# Patient Record
Sex: Female | Born: 1975 | Race: Black or African American | Hispanic: No | Marital: Married | Smoking: Never smoker
Health system: Southern US, Community
[De-identification: ages and names within clinical notes are randomized; demographics above are authoritative.]

## PROBLEM LIST (undated history)

## (undated) DIAGNOSIS — I10 Essential (primary) hypertension: Secondary | ICD-10-CM

## (undated) DIAGNOSIS — T7840XA Allergy, unspecified, initial encounter: Secondary | ICD-10-CM

## (undated) DIAGNOSIS — D509 Iron deficiency anemia, unspecified: Secondary | ICD-10-CM

## (undated) DIAGNOSIS — E7401 von Gierke disease: Secondary | ICD-10-CM

## (undated) DIAGNOSIS — D75A Glucose-6-phosphate dehydrogenase (G6PD) deficiency without anemia: Secondary | ICD-10-CM

## (undated) HISTORY — DX: Glucose-6-phosphate dehydrogenase (G6PD) deficiency without anemia: D75.A

## (undated) HISTORY — DX: Allergy, unspecified, initial encounter: T78.40XA

## (undated) HISTORY — DX: Von Gierke disease: E74.01

## (undated) HISTORY — DX: Essential (primary) hypertension: I10

## (undated) HISTORY — DX: Iron deficiency anemia, unspecified: D50.9

---

## 1998-08-18 ENCOUNTER — Emergency Department (HOSPITAL_COMMUNITY): Admission: EM | Admit: 1998-08-18 | Discharge: 1998-08-18 | Payer: Self-pay | Admitting: Emergency Medicine

## 1998-10-21 ENCOUNTER — Other Ambulatory Visit: Admission: RE | Admit: 1998-10-21 | Discharge: 1998-10-21 | Payer: Self-pay | Admitting: Obstetrics and Gynecology

## 1999-10-28 ENCOUNTER — Other Ambulatory Visit: Admission: RE | Admit: 1999-10-28 | Discharge: 1999-10-28 | Payer: Self-pay | Admitting: *Deleted

## 2000-05-15 ENCOUNTER — Inpatient Hospital Stay (HOSPITAL_COMMUNITY): Admission: AD | Admit: 2000-05-15 | Discharge: 2000-05-15 | Payer: Self-pay | Admitting: Obstetrics and Gynecology

## 2000-05-16 ENCOUNTER — Ambulatory Visit (HOSPITAL_COMMUNITY): Admission: RE | Admit: 2000-05-16 | Discharge: 2000-05-16 | Payer: Self-pay | Admitting: Obstetrics and Gynecology

## 2000-06-24 ENCOUNTER — Encounter: Payer: Self-pay | Admitting: Obstetrics and Gynecology

## 2000-06-24 ENCOUNTER — Ambulatory Visit (HOSPITAL_COMMUNITY): Admission: RE | Admit: 2000-06-24 | Discharge: 2000-06-24 | Payer: Self-pay | Admitting: Obstetrics and Gynecology

## 2000-10-04 ENCOUNTER — Other Ambulatory Visit: Admission: RE | Admit: 2000-10-04 | Discharge: 2000-10-04 | Payer: Self-pay | Admitting: Obstetrics and Gynecology

## 2003-02-06 ENCOUNTER — Other Ambulatory Visit: Admission: RE | Admit: 2003-02-06 | Discharge: 2003-02-06 | Payer: Self-pay | Admitting: Obstetrics & Gynecology

## 2003-12-30 ENCOUNTER — Emergency Department (HOSPITAL_COMMUNITY): Admission: EM | Admit: 2003-12-30 | Discharge: 2003-12-30 | Payer: Self-pay | Admitting: *Deleted

## 2004-02-17 ENCOUNTER — Other Ambulatory Visit: Admission: RE | Admit: 2004-02-17 | Discharge: 2004-02-17 | Payer: Self-pay | Admitting: Obstetrics & Gynecology

## 2005-07-05 ENCOUNTER — Other Ambulatory Visit: Admission: RE | Admit: 2005-07-05 | Discharge: 2005-07-05 | Payer: Self-pay | Admitting: Family Medicine

## 2006-04-05 ENCOUNTER — Ambulatory Visit (HOSPITAL_COMMUNITY): Admission: RE | Admit: 2006-04-05 | Discharge: 2006-04-05 | Payer: Self-pay | Admitting: Obstetrics and Gynecology

## 2006-06-16 ENCOUNTER — Encounter (HOSPITAL_COMMUNITY): Admission: AD | Admit: 2006-06-16 | Discharge: 2006-06-17 | Payer: Self-pay | Admitting: *Deleted

## 2006-06-21 ENCOUNTER — Inpatient Hospital Stay (HOSPITAL_COMMUNITY): Admission: AD | Admit: 2006-06-21 | Discharge: 2006-08-19 | Payer: Self-pay | Admitting: Obstetrics and Gynecology

## 2006-06-22 ENCOUNTER — Ambulatory Visit: Payer: Self-pay | Admitting: Pediatrics

## 2006-08-26 ENCOUNTER — Inpatient Hospital Stay (HOSPITAL_COMMUNITY): Admission: AD | Admit: 2006-08-26 | Discharge: 2006-08-28 | Payer: Self-pay | Admitting: Obstetrics and Gynecology

## 2006-08-26 ENCOUNTER — Encounter (INDEPENDENT_AMBULATORY_CARE_PROVIDER_SITE_OTHER): Payer: Self-pay | Admitting: Specialist

## 2006-08-30 ENCOUNTER — Encounter: Admission: RE | Admit: 2006-08-30 | Discharge: 2006-09-28 | Payer: Self-pay | Admitting: Obstetrics and Gynecology

## 2006-09-29 ENCOUNTER — Encounter: Admission: RE | Admit: 2006-09-29 | Discharge: 2006-10-29 | Payer: Self-pay | Admitting: Obstetrics and Gynecology

## 2008-03-05 ENCOUNTER — Emergency Department: Payer: Self-pay | Admitting: Emergency Medicine

## 2010-08-19 LAB — CONVERTED CEMR LAB: Pap Smear: NORMAL

## 2010-08-24 ENCOUNTER — Ambulatory Visit: Payer: Self-pay | Admitting: Family Medicine

## 2010-08-24 DIAGNOSIS — D6489 Other specified anemias: Secondary | ICD-10-CM | POA: Insufficient documentation

## 2010-08-24 DIAGNOSIS — J309 Allergic rhinitis, unspecified: Secondary | ICD-10-CM | POA: Insufficient documentation

## 2010-08-24 DIAGNOSIS — D509 Iron deficiency anemia, unspecified: Secondary | ICD-10-CM | POA: Insufficient documentation

## 2010-08-24 DIAGNOSIS — I10 Essential (primary) hypertension: Secondary | ICD-10-CM | POA: Insufficient documentation

## 2010-08-24 LAB — CONVERTED CEMR LAB
CO2: 28 meq/L (ref 19–32)
Chloride: 106 meq/L (ref 96–112)
Eosinophils Relative: 2 % (ref 0.0–5.0)
Glucose, Bld: 102 mg/dL — ABNORMAL HIGH (ref 70–99)
HCT: 28.7 % — ABNORMAL LOW (ref 36.0–46.0)
Lymphs Abs: 2.5 10*3/uL (ref 0.7–4.0)
Monocytes Relative: 7.7 % (ref 3.0–12.0)
Neutrophils Relative %: 47.3 % (ref 43.0–77.0)
Platelets: 256 10*3/uL (ref 150.0–400.0)
Potassium: 3.9 meq/L (ref 3.5–5.1)
Sodium: 141 meq/L (ref 135–145)
WBC: 5.9 10*3/uL (ref 4.5–10.5)

## 2010-09-25 ENCOUNTER — Ambulatory Visit: Payer: Self-pay | Admitting: Family Medicine

## 2010-11-19 NOTE — Assessment & Plan Note (Signed)
Summary: new patient to establish/alc   Vital Signs:  Patient profile:   35 year old female Height:      64 inches Weight:      153 pounds BMI:     26.36 Temp:     97.9 degrees F oral Pulse rate:   72 / minute Pulse rhythm:   regular BP sitting:   140 / 90  (right arm) Cuff size:   regular  Vitals Entered By: Linde Gillis CMA Duncan Dull) (August 24, 2010 11:00 AM) CC: new patient, establish care, Abdominal Pain   History of Present Illness: 35 yo here to establish care.  HTN- at a health fair at work a few weeks ago, BP was 150s/90s.  Never been elevated in past.  Continues to remain 140s/80s-150s/90s at work.  No HA, blurred vision, CP or LE edema. Dad has HTN.  Does eat some salty and processed foods.  Gained a few pounds in the last year.   Does take Claritin and Sudafed several times per week.  Allergic rhinitis- see above, takes Sudafed and Claritin several times per week.  Multiple outdoor triggers.  Fall seems to be just as bad a spring.  No cough or wheezing.  Never been on an inhaled steroid.  G6PD deficiency- diagnosed at birth.  Son, Doristine Locks, has it as well.  h/o iron deficiency anemia- last checked several years ago, Hbg was 9!  No DOE, dizziness or fatigue.  Does have very heavy periods.  Not on OCPs, she and her husband are trying to get pregnant.   Preventive Screening-Counseling & Management  Alcohol-Tobacco     Smoking Status: never      Drug Use:  no.    Current Medications (verified): 1)  Amlodipine Besylate 5 Mg Tabs (Amlodipine Besylate) .Marland Kitchen.. 1 Tab By Mouth Daily. 2)  Fluticasone Propionate 50 Mcg/act  Susp (Fluticasone Propionate) .... 2 Sprays Each Nostril Once Daily  Allergies (verified): 1)  ! Aspirin 2)  ! 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  2 childen- see me, Nia and Griffen. 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  2 childen- see me, Nia and Griffen. Married Drug use-no Alcohol use-no Never Smoked Drug Use:  no Smoking Status:  never  Review of Systems      See HPI General:  Denies malaise. Eyes:  Denies blurring. ENT:  Denies difficulty swallowing. CV:  Denies chest pain or discomfort. Resp:  Denies shortness of breath. GI:  Denies abdominal pain. GU:  Denies urinary frequency and urinary hesitancy. MS:  Denies joint pain, joint redness, and joint swelling. Derm:  Denies rash. Neuro:  Denies headaches. Psych:  Denies anxiety and depression. Endo:  Denies cold intolerance and heat intolerance. Heme:  Denies abnormal bruising, bleeding, and enlarge lymph nodes.  Physical Exam  General:  Well-developed,well-nourished,in no acute distress; alert,appropriate and cooperative throughout examination Head:  normocephalic, atraumatic, no abnormalities observed, and no abnormalities palpated.   Eyes:  vision grossly intact, pupils equal, and pupils round.   Ears:  R ear normal and L ear normal.   Nose:  boggy turbinates. sinuses NTTP. Mouth:  Oral mucosa and oropharynx without lesions or exudates.  Teeth in good  repair. Lungs:  Normal respiratory effort, chest expands symmetrically. Lungs are clear to auscultation, no crackles or wheezes. Heart:  Normal rate and regular rhythm. S1 and S2 normal without gallop, murmur, click, rub or other extra sounds. Abdomen:  Bowel sounds positive,abdomen soft and non-tender without masses, organomegaly or hernias noted. Msk:  No deformity or scoliosis noted of thoracic or lumbar spine.   Extremities:  no edema Neurologic:  alert & oriented X3 and cranial nerves II-XII intact.   Skin:  Intact without suspicious  lesions or rashes Psych:  Cognition and judgment appear intact. Alert and cooperative with normal attention span and concentration. No apparent delusions, illusions, hallucinations   Impression & Recommendations:  Problem # 1:  HYPERTENSION (ICD-401.9) Assessment New Discussed different treatment options with Yaakov Guthrie. Difficult because she has G6PD, should not take Sulfa and therefore I would like to avoid HCTZ. Cannot start ACEI because she is trying to get pregnant. Decided on Amlodipine, discussed that it is still a pregnancy class C. Will check BMET today. Follow up in one month. She will cut back on salt in diet and increase physical activity. Her updated medication list for this problem includes:    Amlodipine Besylate 5 Mg Tabs (Amlodipine besylate) .Marland Kitchen... 1 tab by mouth daily.  Orders: Venipuncture (16109)  Problem # 2:  ALLERGIC RHINITIS (ICD-477.9) Assessment: Deteriorated Start Flonase, continue Claritin.  Avoid Sudafed due to #1. Her updated medication list for this problem includes:    Fluticasone Propionate 50 Mcg/act Susp (Fluticasone propionate) .Marland Kitchen... 2 sprays each nostril once daily  Problem # 3:  ANEMIA-IRON DEFICIENCY (ICD-280.9) Recheck CBC today.  Complete Medication List: 1)  Amlodipine Besylate 5 Mg Tabs (Amlodipine besylate) .Marland Kitchen.. 1 tab by mouth daily. 2)  Fluticasone Propionate 50 Mcg/act Susp (Fluticasone propionate) .... 2 sprays each nostril once daily  Other Orders: TLB-CBC Platelet - w/Differential (85025-CBCD) TLB-BMP (Basic Metabolic Panel-BMET) (80048-METABOL)  Patient Instructions: 1)  Great to see you again. 2)  Please come see me in one month. Prescriptions: FLUTICASONE PROPIONATE 50 MCG/ACT  SUSP (FLUTICASONE PROPIONATE) 2 sprays each nostril once daily  #1 vial x 3   Entered and Authorized by:   Ruthe Mannan MD   Signed by:   Ruthe Mannan MD on 08/24/2010   Method used:   Print then Give to Patient   RxID:   [id number] AMLODIPINE  BESYLATE 5 MG TABS (AMLODIPINE BESYLATE) 1 tab by mouth daily.  #30 x 0   Entered and Authorized by:   Ruthe Mannan MD   Signed by:   Ruthe Mannan MD on 08/24/2010   Method used:   Print then Give to Patient   RxID:   [id number]    Orders Added: 1)  Venipuncture [29528] 2)  TLB-CBC Platelet - w/Differential [85025-CBCD] 3)  TLB-BMP (Basic Metabolic Panel-BMET) [80048-METABOL] 4)  New Patient Level III [41324]    Prior Medications (reviewed today): None Current Allergies (reviewed today): ! ASPIRIN ! SULFA  HDL Result Date:  08/10/2010 HDL Result:  80 LDL Result Date:  08/10/2010 LDL Result:  106 PAP Result Date:  08/19/2010 PAP Result:  normal- historical

## 2010-11-19 NOTE — Assessment & Plan Note (Signed)
Summary: 1 month follow up/rbh   Vital Signs:  Patient profile:   35 year old female Height:      64 inches Weight:      153.50 pounds BMI:     26.44 Temp:     98.6 degrees F oral Pulse rate:   76 / minute Pulse rhythm:   regular BP sitting:   130 / 90  (right arm) Cuff size:   regular  Vitals Entered By: Linde Gillis CMA Duncan Dull) (September 25, 2010 12:08 PM) CC: one month follow up   History of Present Illness: 35 yo here for one month follow up HTN.  Last month, started amlodipine 5 mg daily. 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 Diastolic mildly elevated today but she was rushing to get her an having an argument with her husband. Has not noticed any adverse affects from the amlodipine. No CP, blurred vision, HA or LE edema.  h/o iron deficiency anemia- Hbg last month 9.6.  Taking ferrous sulfate 325 mg daily.   No DOE, dizziness or fatigue.  Does have very heavy periods.  Not on OCPs, she and her husband are trying to get pregnant.  No constipation from the iron.  Current Medications (verified): 1)  Amlodipine Besylate 5 Mg Tabs (Amlodipine Besylate) .Marland Kitchen.. 1 Tab By Mouth Daily. 2)  Fluticasone Propionate 50 Mcg/act  Susp (Fluticasone Propionate) .... 2 Sprays Each Nostril Once Daily 3)  Protonix 40 Mg  Tbec (Pantoprazole Sodium) .... Once Daily 4)  Ferrous Sulfate 325 (65 Fe) Mg Tabs (Ferrous Sulfate) .Marland Kitchen.. 1 Tab By Mouth Two Times A Day.  Allergies: 1)  ! Aspirin 2)  ! 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 Married Drug use-no Alcohol use-no Never Smoked  Risk Factors: Smoking Status: never (08/24/2010)  Review of Systems      See HPI General:   Denies malaise. Eyes:  Denies blurring. ENT:  Denies difficulty swallowing. CV:  Denies chest pain or discomfort. Resp:  Denies shortness of breath. Neuro:  Denies visual disturbances and weakness.  Physical Exam  General:  Well-developed,well-nourished,in no acute distress; alert,appropriate and cooperative throughout examination Lungs:  Normal respiratory effort, chest expands symmetrically. Lungs are clear to auscultation, no crackles or wheezes. Heart:  Normal rate and regular rhythm. S1 and S2 normal without gallop, murmur, click, rub or other extra sounds. Extremities:  no edema Neurologic:  alert & oriented X3 and cranial nerves II-XII intact.   Psych:  Cognition and judgment appear intact. Alert and cooperative with normal attention span and concentration. No apparent delusions, illusions, hallucinations   Impression & Recommendations:  Problem # 1:  HYPERTENSION (ICD-401.9) Assessment Improved Continue current dose of Amlodipine. Her updated medication list for this problem includes:    Amlodipine Besylate 5 Mg Tabs (Amlodipine besylate) .Marland Kitchen... 1 tab by mouth daily.  Problem # 2:  ANEMIA-IRON DEFICIENCY (ICD-280.9) Assessment: Unchanged Advised to increase dose of ferrous sulfate to 325 mg two times a day.  Recheck in 3-6 months. Her updated medication list for this problem includes:    Ferrous Sulfate 325 (65 Fe) Mg Tabs (Ferrous sulfate) .Marland Kitchen... 1 tab by mouth two times a day.  Complete Medication List: 1)  Amlodipine Besylate 5 Mg  Tabs (Amlodipine besylate) .Marland Kitchen.. 1 tab by mouth daily. 2)  Fluticasone Propionate 50 Mcg/act Susp (Fluticasone propionate) .... 2 sprays each nostril once daily 3)  Protonix 40 Mg Tbec (Pantoprazole sodium) .... Once daily 4)  Ferrous Sulfate 325 (65 Fe) Mg Tabs (Ferrous sulfate) .Marland Kitchen.. 1 tab by mouth two times a day. Prescriptions: PROTONIX 40 MG  TBEC (PANTOPRAZOLE SODIUM) once daily  #30 x 3   Entered and Authorized by:   Ruthe Mannan MD    Signed by:   Ruthe Mannan MD on 09/25/2010   Method used:   Print then Give to Patient   RxID:   [id number] AMLODIPINE BESYLATE 5 MG TABS (AMLODIPINE BESYLATE) 1 tab by mouth daily.  #90 x 3   Entered and Authorized by:   Ruthe Mannan MD   Signed by:   Ruthe Mannan MD on 09/25/2010   Method used:   Print then Give to Patient   RxID:   [id number] PROTONIX 40 MG  TBEC (PANTOPRAZOLE SODIUM) once daily  #90 x 3   Entered and Authorized by:   Ruthe Mannan MD   Signed by:   Ruthe Mannan MD on 09/25/2010   Method used:   Print then Give to Patient   RxID:   [id number]    Orders Added: 1)  Est. Patient Level III [01027]    Current Allergies (reviewed today): ! ASPIRIN ! SULFA

## 2011-01-07 ENCOUNTER — Other Ambulatory Visit: Payer: Self-pay | Admitting: Physician Assistant

## 2011-01-18 ENCOUNTER — Inpatient Hospital Stay (HOSPITAL_COMMUNITY): Payer: PRIVATE HEALTH INSURANCE

## 2011-01-18 ENCOUNTER — Inpatient Hospital Stay (HOSPITAL_COMMUNITY)
Admission: AD | Admit: 2011-01-18 | Discharge: 2011-01-18 | Disposition: A | Discharge status: Home / Self Care | Payer: PRIVATE HEALTH INSURANCE | Source: Ambulatory Visit | Attending: Obstetrics and Gynecology | Admitting: Obstetrics and Gynecology

## 2011-01-18 ENCOUNTER — Other Ambulatory Visit: Payer: Self-pay | Admitting: Obstetrics and Gynecology

## 2011-01-18 DIAGNOSIS — O039 Complete or unspecified spontaneous abortion without complication: Secondary | ICD-10-CM | POA: Insufficient documentation

## 2011-01-18 LAB — CBC
HCT: 26.3 % — ABNORMAL LOW (ref 36.0–46.0)
Hemoglobin: 8.4 g/dL — ABNORMAL LOW (ref 12.0–15.0)
MCH: 29.4 pg (ref 26.0–34.0)
MCHC: 31.9 g/dL (ref 30.0–36.0)
MCV: 92 fL (ref 78.0–100.0)

## 2011-02-02 ENCOUNTER — Encounter: Payer: Self-pay | Admitting: Family Medicine

## 2011-02-04 ENCOUNTER — Ambulatory Visit (INDEPENDENT_AMBULATORY_CARE_PROVIDER_SITE_OTHER): Payer: PRIVATE HEALTH INSURANCE | Admitting: Family Medicine

## 2011-02-04 ENCOUNTER — Encounter: Payer: Self-pay | Admitting: Family Medicine

## 2011-02-04 ENCOUNTER — Encounter: Payer: Self-pay | Admitting: *Deleted

## 2011-02-04 DIAGNOSIS — Z8759 Personal history of other complications of pregnancy, childbirth and the puerperium: Secondary | ICD-10-CM

## 2011-02-04 DIAGNOSIS — D509 Iron deficiency anemia, unspecified: Secondary | ICD-10-CM

## 2011-02-04 DIAGNOSIS — I10 Essential (primary) hypertension: Secondary | ICD-10-CM

## 2011-02-04 DIAGNOSIS — Z8742 Personal history of other diseases of the female genital tract: Secondary | ICD-10-CM

## 2011-02-04 LAB — CBC WITH DIFFERENTIAL/PLATELET
Basophils Absolute: 0 10*3/uL (ref 0.0–0.1)
HCT: 29.4 % — ABNORMAL LOW (ref 36.0–46.0)
Lymphs Abs: 2.3 10*3/uL (ref 0.7–4.0)
MCV: 98.5 fl (ref 78.0–100.0)
Monocytes Absolute: 0.4 10*3/uL (ref 0.1–1.0)
Neutro Abs: 2.9 10*3/uL (ref 1.4–7.7)
Platelets: 340 10*3/uL (ref 150.0–400.0)
RDW: 15.6 % — ABNORMAL HIGH (ref 11.5–14.6)

## 2011-02-04 LAB — IBC PANEL: Transferrin: 251.4 mg/dL (ref 212.0–360.0)

## 2011-02-04 NOTE — Assessment & Plan Note (Signed)
With worsening of her chronic iron deficiency anemia, Recheck CBC, TIBC, retic count today. Continue iron supplementation.

## 2011-02-04 NOTE — Progress Notes (Signed)
Addended byMills Koller on: 02/04/2011 12:53 PM   Modules accepted: Orders

## 2011-02-04 NOTE — Assessment & Plan Note (Signed)
Improved. D/c amlodipine as she and her husband are going to try for another pregnancy. She will call me with BP readings. If it increases again, we will start betablocker. The patient indicates understanding of these issues and agrees with the plan.

## 2011-02-04 NOTE — Progress Notes (Signed)
35 yo here female here to discuss anemia.  Had a miscarriage at 8 weeks of pregnancy on 01/18/2011. Per pt, saw Dr. Billy Coast (OBGYN), required D and C. Hgb at that time was 7.1, increased to 7.8 a week later.  This is her first miscarriage.  Pt is still very fatigued.  Stopped her amlodipine as well.  No CP or SOB.  Feels she is dealing well emotionally, denies any symptoms of depression.  The PMH, PSH, Social History, Family History, Medications, and allergies have been reviewed in [facility], and have been updated if relevant.  ROS: General: Denies fever, chills, sweats. No significant weight loss. Cardiovascular: Denies chest pains, palpitations, dyspnea on exertion,  Respiratory: Denies cough, dyspnea at rest,wheeezing Neuro: Denies  paresthesias, frequent falls, frequent headaches Psych: Denies depression, anxiety Endocrine: Denies cold intolerance, heat intolerance, polydipsia Heme: Denies enlarged lymph nodes  Physical exam: BP 136/90  Pulse 80  Temp(Src) 98.5 F (36.9 C) (Oral)  Ht 5\' 4"  (1.626 m)  Wt 154 lb 6.4 oz (70.035 kg)  BMI 26.50 kg/m2 Gen:  Alert, NAD CVS:  RRR Resp:  CTA bilaterally. Psych: good eye contact, no signs of depression or anxiety.

## 2011-02-05 LAB — RETICULOCYTES: Retic Ct Pct: 5.3 % — ABNORMAL HIGH (ref 0.4–3.1)

## 2011-02-05 NOTE — H&P (Signed)
Jodi Ross, Jodi Ross               ACCOUNT NO.:  [id number]  MEDICAL RECORD NO.:  [id number]           PATIENT TYPE:  O  LOCATION:  [facility]                         [facility]  PHYSICIAN:  Lenoard Aden, M.D.DATE OF BIRTH:  Feb 09, 1976  DATE OF ADMISSION:  01/18/2011 DATE OF DISCHARGE:                             HISTORY & PHYSICAL   CHIEF COMPLAINT:  Bleeding.  She is a 35 year old African American female G4, P2-0-1-2 at 8 plus weeks' gestation with a documented viability by ultrasound performed on January 14, 2011, which revealed a viable 8-week intrauterine pregnancy. She started with bleeding that became heavy this afternoon, presents now for evaluation.  She denies fevers, chills, nausea, vomiting.  She denies dizziness.  She has a history of chronic anemia.  She has allergies to aspirin and sulfa drugs.  She is a nonsmoker, nondrinker. She denies domestic or physical violence.  She has a family history of diabetes, anemia, and chronic hypertension.  She has a personal history also remarkable for history of Trichomonas, history of G6PD deficiency. Obstetric history remarkable for one trimester ATAB and two vaginal deliveries.  She had a history of a LEEP for dysplasia in 2010.  Medications include iron, Claritin as needed, prenatal vitamins, Norvasc, Zofran, and Protonix.  PHYSICAL EXAM:  GENERAL:  She is a well-developed, well-nourished [place] American female, in no acute distress. VITAL SIGNS:  Blood pressure 92/50, pulse 83, respirations 18. HEENT:  Normal. NECK:  Supple.  Full range of motion. LUNGS:  Clear. ABDOMEN:  Soft, nontender.  No rebound, guarding, no CVA tenderness. EXTREMITIES:  There are no cords. NEUROLOGIC:  Nonfocal. SKIN:  Intact. PELVIC:  Upon placement of bivalve speculum reveals a large amount of clot in the vagina and a large bluish discoloration likely gestational sac, which was extracted using ring forceps.  Further probing of  the endocervical canal and uterus shows little to no more tissue after extraction of further blood clots.  4x4s are used in conjunction with ring forceps to swab further bleeding from the vagina.  Upon reevaluation of her cervix, the cervix was opened, but minimal active bleeding is noted.  Pelvic exam reveals a well-contracted uterus and no adnexal masses.  No pelvic tenderness noted.  CBC reveals a white blood cell count of 9, hemoglobin 8.4, hematocrit 26.3, and platelet count of 203.  Ultrasound is pending.  IMPRESSION:  Probable complete abortion.  PLAN:  Check ultrasound.  We will treat empirically with doxycycline, give ibuprofen for cramping.  Follow up in the office pending her bleeding.  She will be discharged home pending her ultrasound results. Her blood type is A positive.     Lenoard Aden, M.D.     RJT/MEDQ  D:  01/18/2011  T:  01/19/2011  Job:  [id number]  Electronically Signed by Olivia Mackie M.D. on 02/05/2011 03:18:01 PM

## 2011-03-05 NOTE — H&P (Signed)
NAMESHIRLIE, Jodi Ross               ACCOUNT NO.:  [id number]   MEDICAL RECORD NO.:  [id number]          PATIENT TYPE:  MAT   LOCATION:  [facility]                          [facility]  [facility]   PHYSICIAN:  Lenoard Aden, M.D.DATE OF BIRTH:  [birth year]   DATE OF ADMISSION:  08/26/2006  DATE OF DISCHARGE:                                HISTORY & PHYSICAL   CHIEF COMPLAINT:  Contractions.   She is a 35 year old African-American female, G3, P0-1-1-1, who presents at  34-1/7 weeks' gestation with increased frequency of contractions.  She has a  history of preterm birth, history of cerclage.   MEDICATIONS:  Prenatal vitamins and 17-hydroxyprogesterone.   She has a family history of diabetes, hypertension and anemia.   Pregnancy course complicated by preterm cervical change, for which she was  in the hospital for approximately 8 weeks from 24 weeks and discharged with  stable cervical change.   PHYSICAL EXAMINATION:  GENERAL:  She is a well-developed, well-nourished  African-American female in no acute distress.  HEENT:  Normal.  LUNGS:  Clear.  HEART:  Regular rate and rhythm.  ABDOMEN:  Soft, gravid, nontender to palpation.  PELVIC:  Cervix is 2-3 cm and about 80-90% effaced.  The cerclage is intact  for 360 degrees.  No bleeding noted.  Membranes palpable.  Vertex position,  -1 to 0 station.  Contractions every 3 minutes noted.   The patient was brought to Maternity Admissions, given subcu terbutaline x3  and Procardia x1, now with contractions roughly every 20 minutes and marked  improvement in contraction frequency.  The NST is reactive.  No  decelerations are noted.   IMPRESSION:  1. Intrauterine pregnancy at 34-1/7 weeks.  2. Cerclage.  3. Preterm labor.   PLAN:  Try to establish p.o. Procardia.  Will continue magnesium sulfate  therapy pending response.  Will possibly discharge home versus inpatient  admission.     Lenoard Aden, M.D.  Electronically Signed    RJT/MEDQ  D:  08/26/2006  T:  08/26/2006  Job:  [id number]

## 2011-03-05 NOTE — Op Note (Signed)
NAMEOLIVIAROSE, PUNCH               ACCOUNT NO.:  [id number]   MEDICAL RECORD NO.:  [id number]          PATIENT TYPE:  AMB   LOCATION:  [facility]                           [facility]  [facility]   PHYSICIAN:  Lenoard Aden, M.D.DATE OF BIRTH:  09-26-1976   DATE OF PROCEDURE:  04/05/2006  DATE OF DISCHARGE:                                 OPERATIVE REPORT   PREOPERATIVE DIAGNOSIS:  A 13-week obstetric.  History of cervical  insufficiency.   POSTOPERATIVE DIAGNOSIS:  A 13-week obstetric.  History of cervical  insufficiency.   PROCEDURE:  McDonald cervical cerclage.   SURGEON:  Lenoard Aden, M.D.   ASSISTANT:  Genia Del, M.D.   ANESTHESIA:  Spinal, by Jean Rosenthal.   ESTIMATED BLOOD LOSS:  Less than 50 cc.   COMPLICATIONS:  None.   DRAINS:  None.   COUNTS:  Correct.   Patient to recovery in good condition.   BRIEF OPERATIVE NOTE:  After being apprised of risks of anesthesia,  infection, bleeding, intra-abdominal organs need for repair, delayed versus  immediate complications, to include bowel and bladder injury from cerclage,  possible risk of pregnancy loss, patient brought to the operating room,  where she is administered spinal anesthetic without complications.  Prepped  and draped in the usual sterile fashion.  Catheterized to the bladder.  After achieving adequate anesthesia, the cervical vaginal junction is  identified.  A weighted speculum is placed, and a 5 Ethibond suture is  placed, taking progressive bites of the cervical stroma from 1300 to 1100,  1100 to 1900, 1900 to 1700, 1700 to 1400, and 1400 to 1300.  The knot is  then tied without difficulty over a Prolene tie.  Good hemostasis is noted.  Cervical exam reveals the cervix to be long and 3.5 cm long and closed.  The  patient tolerated the procedure well.  Fetal heart tones are heard pre- and  post procedure.  She is transferred to the recovery room in good condition.     Lenoard Aden, M.D.  Electronically Signed    RJT/MEDQ  D:  04/05/2006  T:  04/05/2006  Job:  [id number]

## 2011-03-05 NOTE — H&P (Signed)
Jodi Ross, Jodi Ross               ACCOUNT NO.:  [id number]   MEDICAL RECORD NO.:  [id number]          PATIENT TYPE:  INP   LOCATION:  9156                          [facility]   PHYSICIAN:  Lenoard Aden, M.D.DATE OF BIRTH:  [birth year]   DATE OF ADMISSION:  06/21/2006  DATE OF DISCHARGE:                                HISTORY & PHYSICAL   CHIEF COMPLAINT:  Cervical dilatation.   HISTORY OF PRESENT ILLNESS:  She is a 35 year old African-American female,  21 and 5/[redacted] weeks gestation with known cerclage, history of pre-term birth  with cervical change and membranes at the external os noted.   MEDICATIONS:  Include 17 hydroxyprogesterone weekly, Zantac and prenatal  vitamins.   ALLERGIES:  She has allergy to sulfa drugs and aspirin.   SOCIAL HISTORY:  She is a nonsmoker, nondrinker. She denies domestic or  physical violence.   PAST MEDICAL HISTORY:  She has a history of diabetes and chronic  hypertension and anemia.   PAST SURGICAL HISTORY:  She has had a LEEP in 1997 and previous to that  cryosurgery. She has had laser vaporization of the cervix, also in 1997.   PAST GYNECOLOGIC/OBSTETRICAL HISTORY:  She has had previous pregnancies, two  total. One was an induced abortion in the first trimester uncomplicated, and  one a 29 week delivery complicated by pre-term, premature rupture of  membranes of a 2 pound 5 ounce child, which subsequently has done well. Her  pregnancy course to date has been uncomplicated except for placement of an  elective cerclage at 13 weeks. She did have betamethasone on August 30 and  August 31.   LABORATORY DATA:  Today's labs included a wet prep, which was negative. She  did have a fetal __________  on June 15, 2006, which was positive. At that  time she had a cervical length of 4-cm, however, cervical dilatation was  noted. She has had first trimester screen, which was within normal limits,  and otherwise normal laboratory work to include a  blood type of A positive,  antibody screen negative, and hepatitis negative, rubella immune, and a  hemoglobin electrophoresis, which was within normal limits. RPR was negative  and HIV was negative.   PHYSICAL EXAMINATION:  The patient is a well-developed, well-nourished  African-American female in no acute distress. HEENT normal. Lungs clear.  Heart regular rhythm. Abdomen soft, gravid and nontender. Fundal height  consistent with gestational age. The cervix, however, is 2 to 3-cm dilated  with membranes palpable at the external cervical os and visualized cerclage  intact.   IMPRESSION:  1. A 24 and 5/7 week intrauterine pregnancy.  2. Pre-term cervical change status post cerclage placement.   PLAN:  Will admit, we will give empiric antibiotics, check Group B strep,  tocolytics as needed. Betamethasone was given, rescue dose is to be  considered. We will maintain cerclage at this time and place on 24 hour  monitoring for contractions. Fetal heart rate tracing 30 minutes twice a  day. Perinatology consult.      Lenoard Aden, M.D.  Electronically Signed  RJT/MEDQ  D:  06/21/2006  T:  06/21/2006  Job:  [id number]

## 2011-03-05 NOTE — H&P (Signed)
[facility]  Patient:    Jodi Ross, Jodi Ross                      MRN: [id number] Adm. Date:  05/16/00 Attending:  Lenoard Aden, M.D. [facility]                         History and Physical  CHIEF COMPLAINT:              History of multiple cervical surgeries with concerns for cervical incompetence for elective McDonald cerclage.  HISTORY OF PRESENT ILLNESS:   The patient is a 35 year old black female, G2, P0-0-1-0 currently at [redacted] weeks gestation who presents with the aforementioned complaint.  PAST MEDICAL HISTORY:         Prior surgery in 1996, a LEEP procedure performed in 1997 as well as a previous laser ablation to her cervix.  Her cervical ______ has been borderline onultrasound up to this point during the pregnancy.  ALLERGIES:                 SULFA, ASPIRIN, and no other medications.  MEDICATIONS:                  Prenatal vitamins at this time.  FAMILY MEDICAL HISTORY:       Adult onset diabetes.  Lupus.  Cardiovascular disease.  PHYSICAL EXAMINATION:  GENERAL:                      Well-developed, well-nourished black female in no apparent distress.  HEENT:                      Normal.  LUNGS:                        Clear.  HEART:                        Regular rhythm.  ABDOMEN:              Soft and gravid.  Nontender to 15 weeks.  PELVIC:                The cervix is closed, 2 to 2.5 cm long.  No adnexal masses are appreciated.  EXTREMITIES:                  No cords.  NEUROLOGIC:                   Nonfocal.  IMPRESSION:  1. A 15 week intrauterine pregnancy.                               2. History of multiple cervical procedures at high risk for cervical incompetence.  PLAN:                         Proceed with McDonald cervical cerclage.  The risks of anesthesia, infection, bleeding and possible risk of miscarriage of less than 5% was noted.The patient is aware of the elective nature of placement  of this cerclage due to the fact that she has no previous obstetric history, that is consistent with cervical incompetence.  She understands that this cerclage is being placed because  of risk factors.  She and her significant other have both had their questions answered.  They have been reassured and they desire to proceed with this procedure. DD:  05/15/00 TD:  05/15/00 Job: [id number]

## 2011-03-05 NOTE — Consult Note (Signed)
NAMERIVKAH, WOLZ               ACCOUNT NO.:  [id number]   MEDICAL RECORD NO.:  [id number]          PATIENT TYPE:  MAT   LOCATION:  MATC                          FACILITY:  [facility]   PHYSICIAN:  Lenoard Aden, M.D.DATE OF BIRTH:  10-10-1976   DATE OF CONSULTATION:  08/26/2006  DATE OF DISCHARGE:                                   CONSULTATION   CHIEF COMPLAINT:  Possible contractions.   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      Lenoard Aden, M.D.  Electronically Signed     RJT/MEDQ  D:  08/26/2006  T:  08/26/2006  Job:  [id number]

## 2011-03-05 NOTE — Op Note (Signed)
[facility]  Patient:    Jodi Ross                     MRN: [id number] Proc. Date: 05/16/00 Adm. Date:  [id number] Disc. Date: [id number] Attending:  Maxie Better CC:         Lenoard Aden, M.D.                           Operative Report  PREOPERATIVE DIAGNOSIS:  A 14-week intrauterine pregnancy, status post multiple cervical procedures to include laser ablation, LEEP and cryosurgery. At risk for cervical incompetence.  History of second trimester pregnancy abortion.  POSTOPERATIVE DIAGNOSIS:  A 14-week intrauterine pregnancy, status post multiple cervical procedures to include laser ablation, LEEP and cryosurgery. At risk for cervical incompetence.  History of second trimester pregnancy abortion.  OPERATION:  McDonald cervical cerclage.  SURGEON:  Lenoard Aden, M.D.  ASSISTANT:  Sung Amabile. Roslyn Smiling, M.D.  ANESTHESIA:  Spinal.  ESTIMATED BLOOD LOSS:  Less than 50 cc.  COMPLICATIONS:  None.  DRAINS:  None.  COUNTS:  Correct.  Patient to recovery in good condition.  DESCRIPTION OF PROCEDURE:  After being apprised of the risks of anesthesia, infection, bleeding, injury to bowel, possible miscarriage less than 5%, patient brought to the operating room where she was administered spinal anesthetic without complications.  Prepped and draped in the usual sterile fashion, catheterized until bladder empty.  Weighted speculum placed.  A 5 Ethibond suture placed circumferentially at the level of the internal os which is about 2.5 cm in length.  At this time stitch was taken from 1 to 11, then from 10 to 7 oclock, from 6 to 4 oclock and then from 4 oclock back to 1 oclock.  This was tied over a Prolene suture.  Good purchase of the cervix was noted.  No pulling through or tenting.  No bleeding was noted.  Sutures were cut, bladder was emptied.  The patient tolerated the procedure well and was transferred to recovery in good  condition. DD:  05/16/00 TD:  05/17/00 Job: [id number]

## 2011-03-05 NOTE — Discharge Summary (Signed)
Jodi, Ross               ACCOUNT NO.:  [id number]   MEDICAL RECORD NO.:  [id number]          PATIENT TYPE:  INP   LOCATION:  9154                          FACILITY:  [facility]   PHYSICIAN:  Lenoard Aden, M.D.DATE OF BIRTH:  1976/04/11   DATE OF ADMISSION:  06/21/2006  DATE OF DISCHARGE:  08/19/2006                               DISCHARGE SUMMARY   The patient was admitted with preterm cervical change at 24 weeks,  June 21, 2006.  Hospital course was not complicated by any cervical  change.  Monitoring was within normal limits.  Fetal growth was within  normal limits.  She was given betamethasone.  She was discharged to home  when she reached 33 weeks without complications.  Followup in the office  in 1 week.  Discharge teaching done.   Medications to include prenatal vitamins __________ .      Lenoard Aden, M.D.  Electronically Signed     RJT/MEDQ  D:  09/15/2006  T:  09/15/2006  Job:  [id number]

## 2011-05-12 ENCOUNTER — Other Ambulatory Visit: Payer: Self-pay | Admitting: Family Medicine

## 2011-05-12 ENCOUNTER — Telehealth: Payer: Self-pay | Admitting: *Deleted

## 2011-05-12 MED ORDER — DESLORATADINE-PSEUDOEPHED ER 5-240 MG PO TB24: 1.0000 | ORAL_TABLET | Freq: Every day | ORAL | Status: DC

## 2011-05-12 MED ORDER — LORATADINE-PSEUDOEPHEDRINE ER 10-240 MG PO TB24: 1.0000 | ORAL_TABLET | Freq: Every day | ORAL | Status: DC

## 2011-05-12 NOTE — Telephone Encounter (Signed)
Rx for Claritin D called to [facility] employee pharmacy.

## 2011-05-12 NOTE — Telephone Encounter (Signed)
Yes ok to change to Claritin D or Allegra D

## 2011-05-12 NOTE — Telephone Encounter (Signed)
Pharmacy called to let you know that the clarinex d is not covered by patients insurance. She is asking if it would be okay to change it to Claritin D or Allegra D. Please advise.

## 2011-11-11 ENCOUNTER — Ambulatory Visit (INDEPENDENT_AMBULATORY_CARE_PROVIDER_SITE_OTHER): Payer: PRIVATE HEALTH INSURANCE | Admitting: Family Medicine

## 2011-11-11 ENCOUNTER — Encounter: Payer: Self-pay | Admitting: Family Medicine

## 2011-11-11 VITALS — BP 120/88 | HR 76 | Temp 97.9°F | Ht 64.0 in | Wt 156.4 lb

## 2011-11-11 DIAGNOSIS — N946 Dysmenorrhea, unspecified: Secondary | ICD-10-CM | POA: Insufficient documentation

## 2011-11-11 DIAGNOSIS — R109 Unspecified abdominal pain: Secondary | ICD-10-CM

## 2011-11-11 LAB — POCT URINALYSIS DIPSTICK
Bilirubin, UA: NEGATIVE
Ketones, UA: NEGATIVE
Leukocytes, UA: NEGATIVE
Nitrite, UA: NEGATIVE

## 2011-11-11 LAB — POCT URINE PREGNANCY: Preg Test, Ur: NEGATIVE

## 2011-11-11 MED ORDER — DESLORATADINE-PSEUDOEPHED ER 5-240 MG PO TB24: 1.0000 | ORAL_TABLET | Freq: Every day | ORAL | Status: DC

## 2011-11-11 NOTE — Progress Notes (Signed)
Subjective:    Patient ID: Jodi Ross, female    DOB: [birth year], 36 y.o.   MRN: [id number]  HPI  36 yo G2P2  here for persistent menstrual cramps.  Periods are very regular, usually not very heavy. Frequently has heavy cramping at beginning of cycle.  Last day of her period was 5 days ago.  Was cramping the day after until yesterday. No spotting.  Does not think she is pregnant. No dysuria.  No back pain.  No vaginal discharge.  Patient Active Problem List  Diagnoses  . ANEMIA-IRON DEFICIENCY  . OTHER SPECIFIED ANEMIAS  . HYPERTENSION  . ALLERGIC RHINITIS  . History of miscarriage  . Menstrual cramps   Past Medical History  Diagnosis Date  . G6PD (glucose 6 phosphatase deficiency)   . Allergy   . Anemia, iron deficiency    No past surgical history on file. History  Substance Use Topics  . Smoking status: Never Smoker   . Smokeless tobacco: Not on file  . Alcohol Use: No   Family History  Problem Relation Age of Onset  . Lupus Mother   . Hypertension Father    Allergies  Allergen Reactions  . Aspirin   . Sulfonamide Derivatives    No current outpatient prescriptions on file prior to visit.   The PMH, PSH, Social History, Family History, Medications, and allergies have been reviewed in [facility], and have been updated if relevant.   Review of Systems See HPI  No nausea or vomiting. No fever.    Objective:   Physical Exam  BP 120/88  Pulse 76  Temp(Src) 97.9 F (36.6 C) (Oral)  Ht 5\' 4"  (1.626 m)  Wt 156 lb 6.4 oz (70.943 kg)  BMI 26.85 kg/m2  General:  Well-developed,well-nourished,in no acute distress; alert,appropriate and cooperative throughout examination Head:  normocephalic and atraumatic.   Eyes:  vision grossly intact, pupils equal, pupils round, and pupils reactive to light.   Mouth:  good dentition.   Heart:  Normal rate and regular rhythm. S1 and S2 normal without gallop, murmur, click, rub or other extra sounds. Abdomen:  Bowel  sounds positive,abdomen soft and non-tender without masses, organomegaly or hernias noted. Msk:  No deformity or scoliosis noted of thoracic or lumbar spine.   Extremities:  No clubbing, cyanosis, edema, or deformity noted with normal full range of motion of all joints.   Skin:  Intact without suspicious lesions or rashes Psych:  Cognition and judgment appear intact. Alert and cooperative with normal attention span and concentration. No apparent delusions, illusions, hallucinations     Assessment & Plan:   1. Abdominal cramping  POCT urine pregnancy, POCT Urinalysis Dipstick  Pt  Unable to leave urine sample today- she will bring it back for UA and U preg. Reassuring that symptoms have resolved.  If it continues after her next period, will plan to get a pelvic U/S for further evaluation. The patient indicates understanding of these issues and agrees with the plan.

## 2011-11-12 ENCOUNTER — Telehealth: Payer: Self-pay | Admitting: Family Medicine

## 2011-11-12 MED ORDER — LORATADINE-PSEUDOEPHEDRINE ER 10-240 MG PO TB24: 1.0000 | ORAL_TABLET | Freq: Every day | ORAL | Status: DC

## 2011-11-12 NOTE — Telephone Encounter (Signed)
Rx sent 

## 2011-11-12 NOTE — Telephone Encounter (Signed)
Patient requesting refill for Claritin D 24 hr rather than Clarinex D 24 hr.  Pt has taken this in past and had success with it.  Please call [facility] pharmacy at [phone]

## 2011-11-15 NOTE — Telephone Encounter (Signed)
Patient notified by telephone that rx has been sent per Dr. Dayton Martes.

## 2012-01-11 ENCOUNTER — Other Ambulatory Visit: Payer: Self-pay | Admitting: *Deleted

## 2012-01-11 MED ORDER — AMLODIPINE BESYLATE 5 MG PO TABS: 5.0000 mg | ORAL_TABLET | Freq: Every day | ORAL | Status: DC

## 2012-08-03 ENCOUNTER — Other Ambulatory Visit: Payer: Self-pay | Admitting: *Deleted

## 2012-08-03 MED ORDER — LORATADINE-PSEUDOEPHEDRINE ER 10-240 MG PO TB24: 1.0000 | ORAL_TABLET | Freq: Every day | ORAL | Status: AC

## 2012-10-03 ENCOUNTER — Other Ambulatory Visit: Payer: Self-pay | Admitting: *Deleted

## 2012-10-03 MED ORDER — AMLODIPINE BESYLATE 5 MG PO TABS: 5.0000 mg | ORAL_TABLET | Freq: Every day | ORAL | Status: DC

## 2012-10-03 NOTE — Telephone Encounter (Signed)
Received faxed refill request from pharmacy. Refill sent electronically. 

## 2012-11-06 ENCOUNTER — Encounter: Payer: Self-pay | Admitting: Family Medicine

## 2012-11-06 ENCOUNTER — Ambulatory Visit (INDEPENDENT_AMBULATORY_CARE_PROVIDER_SITE_OTHER): Payer: 59 | Admitting: Family Medicine

## 2012-11-06 VITALS — BP 130/82 | HR 76 | Temp 98.0°F | Wt 160.0 lb

## 2012-11-06 DIAGNOSIS — I10 Essential (primary) hypertension: Secondary | ICD-10-CM

## 2012-11-06 MED ORDER — METOPROLOL SUCCINATE ER 25 MG PO TB24: 25.0000 mg | ORAL_TABLET | Freq: Every day | ORAL | Status: DC

## 2012-11-06 NOTE — Progress Notes (Signed)
Subjective:    Patient ID: Jodi Ross, female    DOB: Apr 02, 1976, 37 y.o.   MRN: [id number]  HPI  37 yo here to follow up blood pressure.  On Norvasc 5 mg daily.   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 She does drink 5 hour energy drink at work.  Denies any CP or SOB.  Just had labs done at work and per pt, TSH, CBC normal (h/o anemia).   Patient Active Problem List  Diagnosis  . ANEMIA-IRON DEFICIENCY  . OTHER SPECIFIED ANEMIAS  . HYPERTENSION  . ALLERGIC RHINITIS  . History of miscarriage  . Menstrual cramps   Past Medical History  Diagnosis Date  . G6PD (glucose 6 phosphatase deficiency)   . Allergy   . Anemia, iron deficiency    No past surgical history on file. History  Substance Use Topics  . Smoking status: Never Smoker   . Smokeless tobacco: Not on file  . Alcohol Use: No   Family History  Problem Relation Age of Onset  . Lupus Mother   . Hypertension Father    Allergies  Allergen Reactions  . Aspirin   . Sulfonamide Derivatives    Current Outpatient Prescriptions on File Prior to Visit  Medication Sig Dispense Refill  . loratadine-pseudoephedrine (CLARITIN-D 24-HOUR) 10-240 MG per 24 hr tablet Take 1 tablet by mouth daily.  30 tablet  5  . metoprolol succinate (TOPROL XL) 25 MG 24 hr tablet Take 1 tablet (25 mg total) by mouth daily.  30 tablet  1   The PMH, PSH, Social History, Family History, Medications, and allergies have been reviewed in [facility], and have been updated if relevant.   Review of Systems See HPI    Objective:   Physical Exam BP 130/82  Pulse 76  Temp 98 F (36.7 C)  Wt 160 lb (72.576 kg)  General:  Well-developed,well-nourished,in no acute distress; alert,appropriate and cooperative throughout examination Head:  normocephalic and atraumatic.   Eyes:  vision grossly intact, pupils equal, pupils round, and pupils reactive to light.   Ears:  R ear normal and L  ear normal.   Nose:  no external deformity.   Mouth:  good dentition.   Lungs:  Normal respiratory effort, chest expands symmetrically. Lungs are clear to auscultation, no crackles or wheezes. Heart:  Normal rate and regular rhythm. S1 and S2 normal without gallop, murmur, click, rub or other extra sounds. Msk:  No deformity or scoliosis noted of thoracic or lumbar spine.   Extremities:  No clubbing, cyanosis, edema, or deformity noted with normal full range of motion of all joints.   Neurologic:  alert & oriented X3 and gait normal.   Skin:  Intact without suspicious lesions or rashes Psych:  Cognition and judgment appear intact. Alert and cooperative with normal attention span and concentration. No apparent delusions, illusions, hallucinations    Assessment & Plan:   1. HYPERTENSION    Stable and pulse is normal and regular today.  She declines cardiology work up. Will d/c norvasc.  Start Toprol XL 25 mg daily.  She will call me in 2 weeks with an update of her blood pressure and pulse.

## 2012-11-06 NOTE — Patient Instructions (Addendum)
Good to see you. Please stop taking your norvasc. Start taking Toprol XL 25 mg daily. Check your blood pressure over next two weeks and call me with readings.

## 2013-01-26 ENCOUNTER — Telehealth: Payer: Self-pay | Admitting: Family Medicine

## 2013-01-26 NOTE — Telephone Encounter (Signed)
How has her blood pressure been running?

## 2013-01-26 NOTE — Telephone Encounter (Signed)
Pt calling with follow up on starting Metoprolol.  HR 80's-90's.  No arrhythmias. 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  Says she hasn't noticed a real change since she started Metoprolol.  Wants to know if she should still take Metoprolol or go back to Norvasc.

## 2013-01-26 NOTE — Telephone Encounter (Signed)
Spoke with patient.  She states she hasn't been taking her BP but will check it this weekend and will call back on Monday with readings.

## 2013-02-06 ENCOUNTER — Other Ambulatory Visit: Payer: Self-pay | Admitting: Nurse Practitioner

## 2013-02-06 ENCOUNTER — Other Ambulatory Visit (HOSPITAL_COMMUNITY)
Admission: RE | Admit: 2013-02-06 | Discharge: 2013-02-06 | Disposition: A | Discharge status: Home / Self Care | Payer: 59 | Source: Ambulatory Visit | Attending: Obstetrics and Gynecology | Admitting: Obstetrics and Gynecology

## 2013-02-06 ENCOUNTER — Telehealth: Payer: Self-pay

## 2013-02-06 DIAGNOSIS — N760 Acute vaginitis: Secondary | ICD-10-CM | POA: Insufficient documentation

## 2013-02-06 DIAGNOSIS — Z1151 Encounter for screening for human papillomavirus (HPV): Secondary | ICD-10-CM | POA: Insufficient documentation

## 2013-02-06 DIAGNOSIS — Z01419 Encounter for gynecological examination (general) (routine) without abnormal findings: Secondary | ICD-10-CM | POA: Insufficient documentation

## 2013-02-06 DIAGNOSIS — Z113 Encounter for screening for infections with a predominantly sexual mode of transmission: Secondary | ICD-10-CM | POA: Insufficient documentation

## 2013-02-06 MED ORDER — METOPROLOL SUCCINATE ER 25 MG PO TB24: 25.0000 mg | ORAL_TABLET | Freq: Every day | ORAL | Status: DC

## 2013-02-06 NOTE — Telephone Encounter (Signed)
Pt states she is ok with either one, whichever you prefer.

## 2013-02-06 NOTE — Telephone Encounter (Signed)
BP looks great so I am ok with refilling either one.  Which one did she feel better taking?

## 2013-02-06 NOTE — Telephone Encounter (Signed)
Metoprolol refilled.

## 2013-02-06 NOTE — Telephone Encounter (Signed)
Pt left v/m for Dr Elmer Sow med assistant; pt was to call with BP prior to Dr Dayton Martes refilling Norvasc or Metoprolol to [facility] pharmacy.  last BP 132/85.Please advise.

## 2013-03-14 ENCOUNTER — Ambulatory Visit (INDEPENDENT_AMBULATORY_CARE_PROVIDER_SITE_OTHER): Payer: 59 | Admitting: Family Medicine

## 2013-03-14 ENCOUNTER — Encounter: Payer: Self-pay | Admitting: Family Medicine

## 2013-03-14 VITALS — BP 120/80 | HR 76 | Temp 98.3°F | Wt 154.0 lb

## 2013-03-14 DIAGNOSIS — M25531 Pain in right wrist: Secondary | ICD-10-CM | POA: Insufficient documentation

## 2013-03-14 DIAGNOSIS — M25539 Pain in unspecified wrist: Secondary | ICD-10-CM

## 2013-03-14 DIAGNOSIS — I10 Essential (primary) hypertension: Secondary | ICD-10-CM

## 2013-03-14 MED ORDER — AMLODIPINE BESYLATE 5 MG PO TABS: 5.0000 mg | ORAL_TABLET | Freq: Every day | ORAL | Status: DC

## 2013-03-14 MED ORDER — MELOXICAM 15 MG PO TABS: 15.0000 mg | ORAL_TABLET | Freq: Every day | ORAL | Status: DC

## 2013-03-14 MED ORDER — PANTOPRAZOLE SODIUM 40 MG PO TBEC: 40.0000 mg | DELAYED_RELEASE_TABLET | Freq: Every day | ORAL | Status: DC

## 2013-03-14 NOTE — Progress Notes (Signed)
Subjective:    Patient ID: Jodi Ross, female    DOB: [birth year], 37 y.o.   MRN: [id number]  HPI  37 yo here to follow up blood pressure.  On Metoprolol 25 mg daily.  We had switched to betablocked from amlodipine due to asymptomatic tachycardia but now feels metoprolol is making her tired.   Denies any CP or SOB.  Right wrist pain- has been playing tennis for a few months now.  Has noticed right wrist pain only when ball hits the raquet.  No neuropathy, swelling or decreased ROM.   Patient Active Problem List   Diagnosis Date Noted  . Menstrual cramps 11/11/2011  . History of miscarriage 02/04/2011  . ANEMIA-IRON DEFICIENCY 08/24/2010  . OTHER SPECIFIED ANEMIAS 08/24/2010  . HYPERTENSION 08/24/2010  . ALLERGIC RHINITIS 08/24/2010   Past Medical History  Diagnosis Date  . G6PD (glucose 6 phosphatase deficiency)   . Allergy   . Anemia, iron deficiency    No past surgical history on file. History  Substance Use Topics  . Smoking status: Never Smoker   . Smokeless tobacco: Not on file  . Alcohol Use: No   Family History  Problem Relation Age of Onset  . Lupus Mother   . Hypertension Father    Allergies  Allergen Reactions  . Aspirin   . Sulfonamide Derivatives    Current Outpatient Prescriptions on File Prior to Visit  Medication Sig Dispense Refill  . loratadine-pseudoephedrine (CLARITIN-D 24-HOUR) 10-240 MG per 24 hr tablet Take 1 tablet by mouth daily.  30 tablet  5  . metoprolol succinate (TOPROL XL) 25 MG 24 hr tablet Take 1 tablet (25 mg total) by mouth daily.  30 tablet  11   No current facility-administered medications on file prior to visit.   The PMH, PSH, Social History, Family History, Medications, and allergies have been reviewed in [facility], and have been updated if relevant.   Review of Systems See HPI    Objective:   Physical Exam BP 120/80  Pulse 76  Temp(Src) 98.3 F (36.8 C)  Wt 154 lb (69.854 kg)  BMI 26.42 kg/m2  General:   Well-developed,well-nourished,in no acute distress; alert,appropriate and cooperative throughout examination Head:  normocephalic and atraumatic.   Eyes:  vision grossly intact, pupils equal, pupils round, and pupils reactive to light.   Ears:  R ear normal and L ear normal.   Nose:  no external deformity.   Mouth:  good dentition.   Lungs:  Normal respiratory effort, chest expands symmetrically. Lungs are clear to auscultation, no crackles or wheezes. Heart:  Normal rate and regular rhythm. S1 and S2 normal without gallop, murmur, click, rub or other extra sounds. Msk:   Right wrist:  FROM of motion of right wrist, no TTP, neg finklesteins Extremities:  No clubbing, cyanosis, edema, or deformity noted with normal full range of motion of all joints.   Neurologic:  alert & oriented X3 and gait normal.   Skin:  Intact without suspicious lesions or rashes Psych:  Cognition and judgment appear intact. Alert and cooperative with normal attention span and concentration. No apparent delusions, illusions, hallucinations    Assessment & Plan:  1. HYPERTENSION Very well controlled but she would like a trial off metoprolol. Will d/c metoprolol and restart amlodipine. 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  2. Right wrist pain New- consistent with tendonitis.  Given handout from sports med advisor with support care and rehab exercises. Call or  return to clinic prn if these symptoms worsen or fail to improve as anticipated. The patient indicates understanding of these issues and agrees with the plan.

## 2013-03-14 NOTE — Patient Instructions (Addendum)
Good to see you. Let's start the norvasc. Stop the metoprolol.  Call me if your your pulse increases again.

## 2013-06-14 ENCOUNTER — Other Ambulatory Visit: Payer: Self-pay | Admitting: Family Medicine

## 2013-06-14 DIAGNOSIS — Z309 Encounter for contraceptive management, unspecified: Secondary | ICD-10-CM

## 2013-07-03 ENCOUNTER — Encounter: Payer: 59 | Admitting: Obstetrics & Gynecology

## 2013-07-03 DIAGNOSIS — Z3009 Encounter for other general counseling and advice on contraception: Secondary | ICD-10-CM

## 2013-09-20 ENCOUNTER — Encounter: Payer: Self-pay | Admitting: Family Medicine

## 2013-09-20 ENCOUNTER — Ambulatory Visit (INDEPENDENT_AMBULATORY_CARE_PROVIDER_SITE_OTHER): Payer: 59 | Admitting: Family Medicine

## 2013-09-20 VITALS — BP 140/92 | HR 81 | Temp 98.9°F | Ht 64.0 in | Wt 149.0 lb

## 2013-09-20 DIAGNOSIS — I10 Essential (primary) hypertension: Secondary | ICD-10-CM

## 2013-09-20 DIAGNOSIS — R6889 Other general symptoms and signs: Secondary | ICD-10-CM | POA: Insufficient documentation

## 2013-09-20 DIAGNOSIS — L659 Nonscarring hair loss, unspecified: Secondary | ICD-10-CM | POA: Insufficient documentation

## 2013-09-20 MED ORDER — AMLODIPINE BESYLATE 10 MG PO TABS: 10.0000 mg | ORAL_TABLET | Freq: Every day | ORAL | Status: DC

## 2013-09-20 NOTE — Progress Notes (Signed)
Pre-visit discussion using our clinic review tool. No additional management support is needed unless otherwise documented below in the visit note.  

## 2013-09-20 NOTE — Assessment & Plan Note (Signed)
Poor control off metoprolol. Will increase amlodipine for goal <140/90.  Follow BP at home.

## 2013-09-20 NOTE — Assessment & Plan Note (Signed)
Eval for thyroid disease given several possibly associated symptoms.  Hair loss may also be from stress, nutritional changes etc.

## 2013-09-20 NOTE — Progress Notes (Signed)
Subjective:    Patient ID: Jodi Ross, female    DOB: Jun 01, 1976, 37 y.o.   MRN: [id number]  HPI    37 year old female pt of Dr. Elmer Sow presents with new onset thinning hair in last 6 months. She has been unable to lose weight despite working out. She has been having night sweats worse during her menses. She has been more cold intolerant in last year. No change in mild fatigue. No swelling. No dry skin.   Family hx: no thyroid issues PMH: no thyroid history.   Hypertension:  She has had BPs at home in 140/90s... Was placed on metoprolol over the summer for BP and high HR. She had severe fatigue associated with this. Never had higher dose of amlodipine. Using medication without problems or lightheadedness:  Chest pain with exertion: Edema: Short of breath: Average home BPs: Other issues:  Review of Systems  Constitutional: Negative for fever, fatigue and unexpected weight change.  HENT: Negative for ear pain.   Eyes: Negative for pain.  Respiratory: Negative for chest tightness and shortness of breath.   Cardiovascular: Negative for chest pain, palpitations and leg swelling.  Gastrointestinal: Negative for abdominal pain.  Genitourinary: Negative for dysuria.  Neurological: Negative for syncope.       Objective:   Physical Exam  Constitutional: Vital signs are normal. She appears well-developed and well-nourished. She is cooperative.  Non-toxic appearance. She does not appear ill. No distress.  HENT:  Head: Normocephalic.  Right Ear: Hearing, tympanic membrane, external ear and ear canal normal. Tympanic membrane is not erythematous, not retracted and not bulging.  Left Ear: Hearing, tympanic membrane, external ear and ear canal normal. Tympanic membrane is not erythematous, not retracted and not bulging.  Nose: No mucosal edema or rhinorrhea. Right sinus exhibits no maxillary sinus tenderness and no frontal sinus tenderness. Left sinus exhibits no maxillary sinus  tenderness and no frontal sinus tenderness.  Mouth/Throat: Uvula is midline, oropharynx is clear and moist and mucous membranes are normal.  Eyes: Conjunctivae, EOM and lids are normal. Pupils are equal, round, and reactive to light. Lids are everted and swept, no foreign bodies found.  Neck: Trachea normal and normal range of motion. Neck supple. Carotid bruit is not present. No mass and no thyromegaly present.  Possible mild diffuse thyroid enlargement but borderline  Cardiovascular: Normal rate, regular rhythm, S1 normal, S2 normal, normal heart sounds, intact distal pulses and normal pulses.  Exam reveals no gallop and no friction rub.   No murmur heard. Pulmonary/Chest: Effort normal and breath sounds normal. Not tachypneic. No respiratory distress. She has no decreased breath sounds. She has no wheezes. She has no rhonchi. She has no rales.  Abdominal: Soft. Normal appearance and bowel sounds are normal. There is no tenderness.  Neurological: She is alert.  Skin: Skin is warm, dry and intact. No rash noted.  Psychiatric: Her speech is normal and behavior is normal. Judgment and thought content normal. Her mood appears not anxious. Cognition and memory are normal. She does not exhibit a depressed mood.          Assessment & Plan:

## 2013-09-20 NOTE — Patient Instructions (Signed)
Increase amlodipine to 10 mg daily. Follow BP at home.. Call in 1-2 weeks with BP and HR measurement.  Stop at lab on way out for thyroid testing.

## 2013-09-21 ENCOUNTER — Encounter: Payer: Self-pay | Admitting: *Deleted

## 2013-09-21 LAB — TSH: TSH: 0.94 u[IU]/mL (ref 0.35–5.50)

## 2014-01-11 ENCOUNTER — Other Ambulatory Visit: Payer: Self-pay | Admitting: Family Medicine

## 2014-01-11 NOTE — Telephone Encounter (Signed)
Pt requesting medication refill. Last ov 02/2013 with last refill 07/2013. pls advise

## 2014-08-21 ENCOUNTER — Other Ambulatory Visit: Payer: Self-pay | Admitting: Family Medicine

## 2014-09-11 ENCOUNTER — Ambulatory Visit (INDEPENDENT_AMBULATORY_CARE_PROVIDER_SITE_OTHER): Payer: 59 | Admitting: Family Medicine

## 2014-09-11 ENCOUNTER — Encounter: Payer: Self-pay | Admitting: Family Medicine

## 2014-09-11 VITALS — BP 120/82 | HR 76 | Temp 98.5°F | Wt 153.2 lb

## 2014-09-11 DIAGNOSIS — S39012A Strain of muscle, fascia and tendon of lower back, initial encounter: Secondary | ICD-10-CM

## 2014-09-11 MED ORDER — MELOXICAM 15 MG PO TABS: 15.0000 mg | ORAL_TABLET | Freq: Every day | ORAL | Status: DC

## 2014-09-11 NOTE — Patient Instructions (Signed)
Great to see you. Take meloxicam daily for next few days- with food. Stretch, use heat at night. Do not take NSAIDs like Ibuprofen or Advil.

## 2014-09-11 NOTE — Progress Notes (Signed)
SUBJECTIVE:  Jodi RegalMarcel A Ross is a 38 y.o. female who complains of low back pain for 2 week(s), positional with bending or lifting, without radiation down the legs. Precipitating factors: none recalled by the patient. Prior history of back problems: recurrent self limited episodes of low back pain in the past. There is no numbness in the legs.  Current Outpatient Prescriptions on File Prior to Visit  Medication Sig Dispense Refill  . amLODipine (NORVASC) 10 MG tablet Take 1 tablet (10 mg total) by mouth daily. 30 tablet 0  . pantoprazole (PROTONIX) 40 MG tablet Take 1 tablet (40 mg total) by mouth daily. 30 tablet 6  . SM LORATA-DINE D 10-240 MG per 24 hr tablet Take 1 tablet by mouth daily. 30 tablet 5   No current facility-administered medications on file prior to visit.    Allergies  Allergen Reactions  . Aspirin   . Sulfonamide Derivatives     Past Medical History  Diagnosis Date  . G6PD (glucose 6 phosphatase deficiency)   . Allergy   . Anemia, iron deficiency     No past surgical history on file.  Family History  Problem Relation Age of Onset  . Lupus Mother   . Hypertension Father     History   Social History  . Marital Status: Married    Spouse Name: N/A    Number of Children: 2  . Years of Education: N/A   Occupational History  . RN    Social History Main Topics  . Smoking status: Never Smoker   . Smokeless tobacco: Never Used  . Alcohol Use: No  . Drug Use: No  . Sexual Activity: Not on file   Other Topics Concern  . Not on file   Social History Narrative   The PMH, PSH, Social History, Family History, Medications, and allergies [facility], and have been updated if relevant.  OBJECTIVE: BP 120/82 mmHg  Pulse 76  Temp(Src) 98.5 F (36.9 C) (Oral)  Wt 153 lb 4 oz (69.514 kg)  SpO2 99%  Patient appears to be in mild to moderate pain, antalgic gait noted. Lumbosacral spine area reveals no local tenderness or mass.  Painful and  reduced LS ROM noted. Straight leg raise is negative at 45 degrees on bilateral. DTR's, motor strength and sensation normal, including heel and toe gait.  Peripheral pulses are palpable. X-Ray: not indicated.  ASSESSMENT:  lumbar strain  PLAN: For acute pain, rest, intermittent application of heat (do not sleep on heating pad), analgesics are recommended. Discussed longer term treatment plan of prn NSAID's and discussed a home back care exercise program with flexion exercise routine. Proper lifting with avoidance of heavy lifting discussed. Consider Physical Therapy and XRay studies if not improving. Call or return to clinic prn if these symptoms worsen or fail to improve as anticipated.

## 2014-09-11 NOTE — Progress Notes (Signed)
Pre visit review using our clinic review tool, if applicable. No additional management support is needed unless otherwise documented below in the visit note. 

## 2014-10-15 ENCOUNTER — Other Ambulatory Visit: Payer: Self-pay

## 2014-10-15 MED ORDER — AMLODIPINE BESYLATE 10 MG PO TABS: ORAL_TABLET | ORAL | Status: DC

## 2014-10-15 NOTE — Telephone Encounter (Signed)
Pt was seen for acute visit only. Even though BP was fine it was not specifically adressed and lab work was not done. Give 30 day supply, follow up will be needed for further refills

## 2014-10-15 NOTE — Telephone Encounter (Signed)
Pt request refill amlodipine to [facility]; advised pt needed to schedule appt for f/u of BP; pt said she was seen 09/11/14 with back pain and her BP was taken and was OK; pt does not want to schedule appt for BP f/u. Pt last seen 09/20/2013 for hypertension. Pt request cb.pt has 3 more amlodipine tabs.

## 2015-01-27 ENCOUNTER — Encounter: Payer: Self-pay | Admitting: *Deleted

## 2015-02-11 ENCOUNTER — Encounter: Payer: Self-pay | Admitting: Family Medicine

## 2015-02-11 ENCOUNTER — Ambulatory Visit (INDEPENDENT_AMBULATORY_CARE_PROVIDER_SITE_OTHER): Payer: 59 | Admitting: Family Medicine

## 2015-02-11 VITALS — BP 136/78 | HR 70 | Temp 98.3°F | Wt 152.5 lb

## 2015-02-11 DIAGNOSIS — I10 Essential (primary) hypertension: Secondary | ICD-10-CM | POA: Diagnosis not present

## 2015-02-11 LAB — BASIC METABOLIC PANEL
BUN: 10 mg/dL (ref 6–23)
CO2: 29 meq/L (ref 19–32)
CREATININE: 0.68 mg/dL (ref 0.40–1.20)
Calcium: 9.4 mg/dL (ref 8.4–10.5)
Chloride: 106 mEq/L (ref 96–112)
GFR: 124.21 mL/min (ref >60.00)
GLUCOSE: 100 mg/dL — AB (ref 70–99)
POTASSIUM: 3.8 meq/L (ref 3.5–5.1)
Sodium: 138 mEq/L (ref 135–145)

## 2015-02-11 MED ORDER — AMLODIPINE BESYLATE 10 MG PO TABS: ORAL_TABLET | ORAL | Status: DC

## 2015-02-11 MED ORDER — LORATADINE-PSEUDOEPHEDRINE ER 10-240 MG PO TB24: 1.0000 | ORAL_TABLET | Freq: Every day | ORAL | Status: DC

## 2015-02-11 NOTE — Progress Notes (Signed)
Subjective:   Patient ID: Jodi Ross, female    DOB: 1976-03-22, 39 y.o.   MRN: [id number]  Jodi Ross is a pleasant 39 y.o. year old female who presents to clinic today with Follow-up  on 02/11/2015  HPI:  HTN- has been well controlled on Norvasc 10 mg daily. Denies any LE edema, CP, SOB, HA or blurred vision.  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 Lab Results  Component Value Date   CREATININE 0.7 08/24/2010   Lab Results  Component Value Date   NA 141 08/24/2010   K 3.9 08/24/2010   CL 106 08/24/2010   CO2 28 08/24/2010   Current Outpatient Prescriptions on File Prior to Visit  Medication Sig Dispense Refill  . meloxicam (MOBIC) 15 MG tablet Take 1 tablet (15 mg total) by mouth daily. 30 tablet 1  . pantoprazole (PROTONIX) 40 MG tablet Take 1 tablet (40 mg total) by mouth daily. 30 tablet 6   No current facility-administered medications on file prior to visit.    Allergies  Allergen Reactions  . Aspirin   . Sulfonamide Derivatives     Past Medical History  Diagnosis Date  . G6PD (glucose 6 phosphatase deficiency)   . Allergy   . Anemia, iron deficiency     History reviewed. No pertinent past surgical history.  Family History  Problem Relation Age of Onset  . Lupus Mother   . Hypertension Father     History   Social History  . Marital Status: Married    Spouse Name: N/A  . Number of Children: 2  . Years of Education: N/A   Occupational History  . RN    Social History Main Topics  . Smoking status: Never Smoker   . Smokeless tobacco: Never Used  . Alcohol Use: No  . Drug Use: No  . Sexual Activity: Not on file   Other Topics Concern  . Not on file   Social History Narrative   The PMH, PSH, Social History, Family History, Medications, and allergies have been reviewed in [facility], and have been updated if relevant.   Review of Systems  Constitutional: Negative.   HENT:  Negative.   Respiratory: Negative.   Cardiovascular: Negative.   Endocrine: Negative.   Genitourinary: Negative.   Musculoskeletal: Negative.   Skin: Negative.   Allergic/Immunologic: Negative.   Neurological: Negative.   Hematological: Negative.   Psychiatric/Behavioral: Negative.   All other systems reviewed and are negative.      Objective:    BP 136/78 mmHg  Pulse 70  Temp(Src) 98.3 F (36.8 C) (Oral)  Wt 152 lb 8 oz (69.174 kg)  SpO2 99%  LMP 02/09/2015 Wt Readings from Last 3 Encounters:  02/11/15 152 lb 8 oz (69.174 kg)  09/11/14 153 lb 4 oz (69.514 kg)  09/20/13 149 lb (67.586 kg)     Physical Exam  Constitutional: She is oriented to person, place, and time. She appears well-developed and well-nourished. No distress.  HENT:  Head: Normocephalic and atraumatic.  Eyes: Conjunctivae are normal.  Neck: Normal range of motion.  Cardiovascular: Normal rate, regular Jodi and normal heart sounds.   Pulmonary/Chest: Effort normal and breath sounds normal. No respiratory distress.  Musculoskeletal: She exhibits no edema.  Neurological: She is alert and oriented to person, place, and time. No cranial nerve deficit.  Skin: Skin is warm and dry.  Psychiatric: She has a normal  mood and affect. Her behavior is normal. Judgment and thought content normal.  Nursing note and vitals reviewed.         Assessment & Plan:   Essential hypertension - Plan: Basic Metabolic Panel No Follow-up on file.

## 2015-02-11 NOTE — Assessment & Plan Note (Signed)
Well controlled. eRx sent for 1 year supply of Norvasc at current dose.  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 Check BMET today. Orders Placed This Encounter  Procedures  . Basic Metabolic Panel   The patient indicates understanding of these issues and agrees with the plan.

## 2015-02-11 NOTE — Progress Notes (Signed)
Pre visit review using our clinic review tool, if applicable. No additional management support is needed unless otherwise documented below in the visit note. 

## 2016-02-03 ENCOUNTER — Ambulatory Visit (INDEPENDENT_AMBULATORY_CARE_PROVIDER_SITE_OTHER): Payer: 59 | Admitting: Family Medicine

## 2016-02-03 ENCOUNTER — Encounter: Payer: Self-pay | Admitting: Family Medicine

## 2016-02-03 VITALS — BP 136/88 | HR 65 | Temp 98.3°F | Wt 154.2 lb

## 2016-02-03 DIAGNOSIS — I10 Essential (primary) hypertension: Secondary | ICD-10-CM | POA: Diagnosis not present

## 2016-02-03 LAB — COMPREHENSIVE METABOLIC PANEL
ALK PHOS: 24 U/L — AB (ref 39–117)
ALT: 22 U/L (ref 0–35)
AST: 21 U/L (ref 0–37)
Albumin: 3.9 g/dL (ref 3.5–5.2)
BILIRUBIN TOTAL: 0.5 mg/dL (ref 0.2–1.2)
BUN: 12 mg/dL (ref 6–23)
CALCIUM: 9.8 mg/dL (ref 8.4–10.5)
CO2: 26 mEq/L (ref 19–32)
Chloride: 106 mEq/L (ref 96–112)
Creatinine, Ser: 0.74 mg/dL (ref 0.40–1.20)
GFR: 112.1 mL/min (ref >60.00)
GLUCOSE: 83 mg/dL (ref 70–99)
Potassium: 4 mEq/L (ref 3.5–5.1)
Sodium: 137 mEq/L (ref 135–145)
TOTAL PROTEIN: 7.4 g/dL (ref 6.0–8.3)

## 2016-02-03 NOTE — Patient Instructions (Signed)
Great to see you. We will call you with your lab results from today.  If you can, get a home BP cuff and keep me updated.

## 2016-02-03 NOTE — Progress Notes (Signed)
Subjective:   Patient ID: Jodi Ross, female    DOB: [birth year], 40 y.o.   MRN: [id number]  Jodi Ross is a pleasant 40 y.o. year old female who presents to clinic today with Hypertension  on 02/03/2016  HPI:  HTN- has been well controlled on Norvasc 10 mg daily.  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  Denies any HA, blurred vision, CP or SOB.  No palpitations.  Lab Results  Component Value Date   CREATININE 0.68 02/11/2015   Current Outpatient Prescriptions on File Prior to Visit  Medication Sig Dispense Refill  . amLODipine (NORVASC) 10 MG tablet Take 1 tablet (10 mg total) by mouth daily. 30 tablet 11  . loratadine-pseudoephedrine (SM LORATA-DINE D) 10-240 MG per 24 hr tablet Take 1 tablet by mouth daily. 30 tablet 11  . meloxicam (MOBIC) 15 MG tablet Take 1 tablet (15 mg total) by mouth daily. 30 tablet 1  . pantoprazole (PROTONIX) 40 MG tablet Take 1 tablet (40 mg total) by mouth daily. 30 tablet 6   No current facility-administered medications on file prior to visit.    Allergies  Allergen Reactions  . Aspirin   . Sulfonamide Derivatives     Past Medical History  Diagnosis Date  . G6PD (glucose 6 phosphatase deficiency)   . Allergy   . Anemia, iron deficiency     No past surgical history on file.  Family History  Problem Relation Age of Onset  . Lupus Mother   . Hypertension Father     Social History   Social History  . Marital Status: Married    Spouse Name: N/A  . Number of Children: 2  . Years of Education: N/A   Occupational History  . RN    Social History Main Topics  . Smoking status: Never Smoker   . Smokeless tobacco: Never Used  . Alcohol Use: No  . Drug Use: No  . Sexual Activity: Not on file   Other Topics Concern  . Not on file   Social History Narrative   The PMH, PSH, Social History, Family History, Medications, and allergies have been  reviewed in [facility], and have been updated if relevant.  Review of Systems  Constitutional: Negative.   HENT: Negative.   Eyes: Negative.   Respiratory: Negative.   Cardiovascular: Negative.   Musculoskeletal: Negative.   Neurological: Negative.   All other systems reviewed and are negative.      Objective:    BP 136/88 mmHg  Pulse 65  Temp(Src) 98.3 F (36.8 C) (Oral)  Wt 154 lb 4 oz (69.967 kg)  SpO2 98%  LMP 01/09/2016   Physical Exam  Constitutional: She is oriented to person, place, and time. She appears well-developed and well-nourished. No distress.  HENT:  Head: Normocephalic and atraumatic.  Eyes: Conjunctivae are normal.  Cardiovascular: Normal rate and regular rhythm.   Pulmonary/Chest: Effort normal and breath sounds normal.  Musculoskeletal: Normal range of motion.  Neurological: She is alert and oriented to person, place, and time. No cranial nerve deficit.  Skin: Skin is warm and dry. She is not diaphoretic.  Psychiatric: She has a normal mood and affect. Her behavior is normal. Judgment and thought content normal.  Nursing note and vitals reviewed.         Assessment & Plan:   Essential hypertension No Follow-up on file.

## 2016-02-03 NOTE — Progress Notes (Signed)
Pre visit review using our clinic review tool, if applicable. No additional management support is needed unless otherwise documented below in the visit note. 

## 2016-02-03 NOTE — Assessment & Plan Note (Signed)
Normotensive here today and she is asymptomatic. Reassurance provided.  No changes made to rx.  Advised NOT checking it at work.  She plans on buying a home BP cuff and will keep me updated with home readings.

## 2016-02-24 ENCOUNTER — Other Ambulatory Visit: Payer: Self-pay | Admitting: Family Medicine

## 2016-03-23 ENCOUNTER — Other Ambulatory Visit: Payer: Self-pay | Admitting: Family Medicine

## 2016-06-01 ENCOUNTER — Other Ambulatory Visit (HOSPITAL_COMMUNITY)
Admission: RE | Admit: 2016-06-01 | Discharge: 2016-06-01 | Disposition: A | Discharge status: Home / Self Care | Payer: 59 | Source: Ambulatory Visit | Attending: Nurse Practitioner | Admitting: Nurse Practitioner

## 2016-06-01 ENCOUNTER — Other Ambulatory Visit: Payer: Self-pay | Admitting: Nurse Practitioner

## 2016-06-01 DIAGNOSIS — Z1151 Encounter for screening for human papillomavirus (HPV): Secondary | ICD-10-CM | POA: Insufficient documentation

## 2016-06-01 DIAGNOSIS — Z01419 Encounter for gynecological examination (general) (routine) without abnormal findings: Secondary | ICD-10-CM | POA: Insufficient documentation

## 2016-06-01 DIAGNOSIS — R102 Pelvic and perineal pain: Secondary | ICD-10-CM | POA: Diagnosis not present

## 2016-06-01 DIAGNOSIS — Z309 Encounter for contraceptive management, unspecified: Secondary | ICD-10-CM | POA: Diagnosis not present

## 2016-06-03 LAB — CYTOLOGY - PAP

## 2016-06-22 DIAGNOSIS — R102 Pelvic and perineal pain: Secondary | ICD-10-CM | POA: Diagnosis not present

## 2016-07-23 DIAGNOSIS — H5213 Myopia, bilateral: Secondary | ICD-10-CM | POA: Diagnosis not present

## 2016-12-14 ENCOUNTER — Other Ambulatory Visit: Payer: Self-pay | Admitting: Family Medicine

## 2017-04-06 ENCOUNTER — Other Ambulatory Visit: Payer: Self-pay | Admitting: Family Medicine

## 2017-05-30 ENCOUNTER — Other Ambulatory Visit: Payer: Self-pay | Admitting: Family Medicine

## 2017-06-23 DIAGNOSIS — H5213 Myopia, bilateral: Secondary | ICD-10-CM | POA: Diagnosis not present

## 2017-07-12 DIAGNOSIS — Z1239 Encounter for other screening for malignant neoplasm of breast: Secondary | ICD-10-CM | POA: Diagnosis not present

## 2017-07-12 DIAGNOSIS — Z309 Encounter for contraceptive management, unspecified: Secondary | ICD-10-CM | POA: Diagnosis not present

## 2017-07-12 DIAGNOSIS — Z01419 Encounter for gynecological examination (general) (routine) without abnormal findings: Secondary | ICD-10-CM | POA: Diagnosis not present

## 2017-07-18 ENCOUNTER — Other Ambulatory Visit: Payer: Self-pay | Admitting: Family Medicine

## 2017-08-01 ENCOUNTER — Other Ambulatory Visit: Payer: Self-pay | Admitting: Nurse Practitioner

## 2017-08-01 DIAGNOSIS — Z1231 Encounter for screening mammogram for malignant neoplasm of breast: Secondary | ICD-10-CM

## 2017-08-24 ENCOUNTER — Other Ambulatory Visit: Payer: Self-pay | Admitting: Nurse Practitioner

## 2017-08-24 ENCOUNTER — Ambulatory Visit
Admission: RE | Admit: 2017-08-24 | Discharge: 2017-08-24 | Disposition: A | Discharge status: Home / Self Care | Payer: 59 | Source: Ambulatory Visit | Attending: Nurse Practitioner | Admitting: Nurse Practitioner

## 2017-08-24 DIAGNOSIS — Z1231 Encounter for screening mammogram for malignant neoplasm of breast: Secondary | ICD-10-CM | POA: Diagnosis not present

## 2017-08-26 ENCOUNTER — Other Ambulatory Visit: Payer: Self-pay | Admitting: Nurse Practitioner

## 2017-08-26 DIAGNOSIS — N6310 Unspecified lump in the right breast, unspecified quadrant: Secondary | ICD-10-CM

## 2017-08-26 DIAGNOSIS — N6320 Unspecified lump in the left breast, unspecified quadrant: Secondary | ICD-10-CM

## 2017-08-26 DIAGNOSIS — R928 Other abnormal and inconclusive findings on diagnostic imaging of breast: Secondary | ICD-10-CM

## 2017-09-05 ENCOUNTER — Ambulatory Visit
Admission: RE | Admit: 2017-09-05 | Discharge: 2017-09-05 | Disposition: A | Discharge status: Home / Self Care | Payer: 59 | Source: Ambulatory Visit | Attending: Nurse Practitioner | Admitting: Nurse Practitioner

## 2017-09-05 DIAGNOSIS — N6001 Solitary cyst of right breast: Secondary | ICD-10-CM | POA: Diagnosis not present

## 2017-09-05 DIAGNOSIS — N6002 Solitary cyst of left breast: Secondary | ICD-10-CM | POA: Diagnosis not present

## 2017-09-05 DIAGNOSIS — R928 Other abnormal and inconclusive findings on diagnostic imaging of breast: Secondary | ICD-10-CM

## 2017-09-05 DIAGNOSIS — N6489 Other specified disorders of breast: Secondary | ICD-10-CM | POA: Diagnosis not present

## 2017-09-05 DIAGNOSIS — N6320 Unspecified lump in the left breast, unspecified quadrant: Secondary | ICD-10-CM

## 2017-09-05 DIAGNOSIS — N6310 Unspecified lump in the right breast, unspecified quadrant: Secondary | ICD-10-CM

## 2017-09-05 DIAGNOSIS — R922 Inconclusive mammogram: Secondary | ICD-10-CM | POA: Diagnosis not present

## 2017-09-19 ENCOUNTER — Ambulatory Visit (INDEPENDENT_AMBULATORY_CARE_PROVIDER_SITE_OTHER): Payer: 59 | Admitting: Family Medicine

## 2017-09-19 ENCOUNTER — Encounter: Payer: Self-pay | Admitting: Family Medicine

## 2017-09-19 VITALS — BP 128/78 | HR 91 | Temp 98.9°F | Ht 64.0 in | Wt 148.8 lb

## 2017-09-19 DIAGNOSIS — M21612 Bunion of left foot: Secondary | ICD-10-CM

## 2017-09-19 DIAGNOSIS — M21611 Bunion of right foot: Secondary | ICD-10-CM

## 2017-09-19 DIAGNOSIS — R202 Paresthesia of skin: Secondary | ICD-10-CM | POA: Insufficient documentation

## 2017-09-19 DIAGNOSIS — I10 Essential (primary) hypertension: Secondary | ICD-10-CM

## 2017-09-19 MED ORDER — LORATADINE-PSEUDOEPHEDRINE ER 10-240 MG PO TB24: 1.0000 | ORAL_TABLET | Freq: Every day | ORAL | 0 refills | Status: DC

## 2017-09-19 MED ORDER — AMLODIPINE BESYLATE 10 MG PO TABS: 10.0000 mg | ORAL_TABLET | Freq: Every day | ORAL | 3 refills | Status: DC

## 2017-09-19 NOTE — Assessment & Plan Note (Signed)
Well controlled on current dose of amlodipine. No changes made. eRx refill sent.

## 2017-09-19 NOTE — Assessment & Plan Note (Signed)
Seems most consistent with cervical nerve impingement.  Discussed more ergonomic desk, change of pillow.  Heating pad. Call or return to clinic prn if these symptoms worsen or fail to improve as anticipated. The patient indicates understanding of these issues and agrees with the plan.

## 2017-09-19 NOTE — Assessment & Plan Note (Signed)
Becoming more symptomatic. Referral to podiatry placed.

## 2017-09-19 NOTE — Progress Notes (Signed)
Subjective:   Patient ID: Jodi Ross, female    DOB: 1975/11/06, 41 y.o.   MRN: [id number]  Jodi Ross is a pleasant 41 y.o. year old female who presents to clinic today with Hypertension (Patient is here today to F/U with hypertension.  She is needing a refill.); Hand Problem (She is also C/O left hand tingling of digits 1&2.  States that it is frequent and it is like the sensation of "falling asleep." Denies any cervicalgia.); and Bunions (She is also requesting a referral for a bunion on left foot that is painful although there are bunions on both feet.)  on 09/19/2017  HPI:  HTN- has been well controlled on current dose of amlodipine.  Asking for a refill today.  Denies any HA, blurred vision, CP or SOB. No LE edema.  Left hand pain- tingling of thumb and first finger, occurring more frequently.  Denies neck pain.  She can shake it out.  No UE weakness. No decreased grip strength.  Bunion on left foot- would like a referral to podiatry as it is becoming more painful.  Current Outpatient Medications on File Prior to Visit  Medication Sig Dispense Refill  . ALLERGY RELIEF/NASAL DECONGEST 10-240 MG 24 hr tablet TAKE 1 TABLET BY MOUTH DAILY. 30 tablet 0  . etonogestrel-ethinyl estradiol (NUVARING) 0.12-0.015 MG/24HR vaginal ring Place 1 each vaginally every 28 (twenty-eight) days. Insert vaginally and leave in place for 3 consecutive weeks, then remove for 1 week.    . meloxicam (MOBIC) 15 MG tablet Take 1 tablet (15 mg total) by mouth daily. (Patient not taking: Reported on 09/19/2017) 30 tablet 1  . pantoprazole (PROTONIX) 40 MG tablet Take 1 tablet (40 mg total) by mouth daily. (Patient not taking: Reported on 09/19/2017) 30 tablet 6   No current facility-administered medications on file prior to visit.     Allergies  Allergen Reactions  . Aspirin   . Sulfonamide Derivatives     Past Medical History:  Diagnosis Date  . Allergy   . Anemia, iron deficiency   . G6PD  (glucose 6 phosphatase deficiency)     No past surgical history on file.  Family History  Problem Relation Age of Onset  . Lupus Mother   . Hypertension Father     Social History   Socioeconomic History  . Marital status: Married    Spouse name: Not on file  . Number of children: 2  . Years of education: Not on file  . Highest education level: Not on file  Social Needs  . Financial resource strain: Not on file  . Food insecurity - worry: Not on file  . Food insecurity - inability: Not on file  . Transportation needs - medical: Not on file  . Transportation needs - non-medical: Not on file  Occupational History  . Occupation: Teacher, adult education: [employer]  Tobacco Use  . Smoking status: Never Smoker  . Smokeless tobacco: Never Used  Substance and Sexual Activity  . Alcohol use: No  . Drug use: No  . Sexual activity: Not on file  Other Topics Concern  . Not on file  Social History Narrative  . Not on file   The PMH, PSH, Social History, Family History, Medications, and allergies have been reviewed in [facility], and have been updated if relevant.   Review of Systems  Constitutional: Negative.   HENT: Negative.   Eyes: Negative.   Respiratory: Negative.   Cardiovascular: Negative.  Gastrointestinal: Negative.   Endocrine: Negative.   Genitourinary: Negative.   Musculoskeletal: Positive for arthralgias.  Allergic/Immunologic: Negative.   Neurological: Positive for numbness.  Hematological: Negative.   Psychiatric/Behavioral: Negative.   All other systems reviewed and are negative.      Objective:    BP 128/78 (BP Location: Left Arm, Patient Position: Sitting, Cuff Size: Normal)   Pulse 91   Temp 98.9 F (37.2 C) (Oral)   Ht 5\' 4"  (1.626 m)   Wt 148 lb 12.8 oz (67.5 kg)   LMP 09/12/2017   SpO2 98%   BMI 25.54 kg/m    Physical Exam   General:  Well-developed,well-nourished,in no acute distress; alert,appropriate and cooperative throughout  examination Head:  normocephalic and atraumatic.   Eyes:  vision grossly intact, PERRL Ears:  R ear normal and L ear normal externally, TMs clear bilaterally Nose:  no external deformity.   Mouth:  good dentition.   Neck:  No deformities, masses, or tenderness noted. Lungs:  Normal respiratory effort, chest expands symmetrically. Lungs are clear to auscultation, no crackles or wheezes. Heart:  Normal rate and regular rhythm. S1 and S2 normal without gallop, murmur, click, rub or other extra sounds. Abdomen:  Bowel sounds positive,abdomen soft and non-tender without masses, organomegaly or hernias noted. Msk:  No deformity or scoliosis noted of thoracic or lumbar spine.   Extremities:  No clubbing, cyanosis, edema, or deformity noted with normal full range of motion of all joints.   Neurologic:  alert & oriented X3 and gait normal.   Skin:  Intact without suspicious lesions or rashes Cervical Nodes:  No lymphadenopathy noted Axillary Nodes:  No palpable lymphadenopathy Psych:  Cognition and judgment appear intact. Alert and cooperative with normal attention span and concentration. No apparent delusions, illusions, hallucinations       Assessment & Plan:   Essential hypertension  Bilateral bunions - Plan: Ambulatory referral to Podiatry  Left hand paresthesia No Follow-up on file.

## 2017-09-19 NOTE — Patient Instructions (Signed)
Great to see you. We will call you with your results and referral from today.

## 2017-09-20 ENCOUNTER — Telehealth: Payer: Self-pay | Admitting: Family Medicine

## 2017-09-20 NOTE — Telephone Encounter (Unsigned)
Copied from CRM #[id number]. [phone]: Referral - Status >> Sep 20, 2017  2:25 PM Raquel SarnaHayes, Teresa G wrote: Pt states that the referral office has not called her back for an appt.  She is wanting to know what needs to be done for the referral office to make an appt for her.

## 2017-09-22 NOTE — Telephone Encounter (Signed)
Pt aware that it may take a week to get this processed/thx dmf

## 2017-10-24 ENCOUNTER — Ambulatory Visit (INDEPENDENT_AMBULATORY_CARE_PROVIDER_SITE_OTHER): Payer: No Typology Code available for payment source | Admitting: Podiatry

## 2017-10-24 ENCOUNTER — Ambulatory Visit: Payer: Self-pay

## 2017-10-24 ENCOUNTER — Encounter: Payer: Self-pay | Admitting: Podiatry

## 2017-10-24 VITALS — BP 124/82 | HR 81 | Resp 16

## 2017-10-24 DIAGNOSIS — M2011 Hallux valgus (acquired), right foot: Secondary | ICD-10-CM

## 2017-10-24 DIAGNOSIS — M2012 Hallux valgus (acquired), left foot: Secondary | ICD-10-CM

## 2017-10-24 NOTE — Progress Notes (Signed)
Subjective:  Patient ID: Jodi Ross, female    DOB: Nov 02, 1975,  MRN: [id number] HPI Chief Complaint  Patient presents with  . Foot Pain    1st MPJ left - aching x couple months, worsens with exercise, redness, no treatment    42 y.o. female presents with the above complaint.   Past Medical History:  Diagnosis Date  . Allergy   . Anemia, iron deficiency   . G6PD (glucose 6 phosphatase deficiency)    No past surgical history on file.  Current Outpatient Medications:  .  amLODipine (NORVASC) 10 MG tablet, Take 1 tablet (10 mg total) by mouth daily., Disp: 90 tablet, Rfl: 3 .  etonogestrel-ethinyl estradiol (NUVARING) 0.12-0.015 MG/24HR vaginal ring, Place 1 each vaginally every 28 (twenty-eight) days. Insert vaginally and leave in place for 3 consecutive weeks, then remove for 1 week., Disp: , Rfl:  .  loratadine-pseudoephedrine (ALLERGY RELIEF/NASAL DECONGEST) 10-240 MG 24 hr tablet, Take 1 tablet by mouth daily., Disp: 30 tablet, Rfl: 0  Allergies  Allergen Reactions  . Aspirin   . Sulfonamide Derivatives    Review of Systems  All other systems reviewed and are negative.  Objective:  There were no vitals filed for this visit.  General: Well developed, nourished, in no acute distress, alert and oriented x3   Dermatological: Skin is warm, dry and supple bilateral. Nails x 10 are well maintained; remaining integument appears unremarkable at this time. There are no open sores, no preulcerative lesions, no rash or signs of infection present.  Vascular: Dorsalis Pedis artery and Posterior Tibial artery pedal pulses are 2/4 bilateral with immedate capillary fill time. Pedal hair growth present. No varicosities and no lower extremity edema present bilateral.   Neruologic: Grossly intact via light touch bilateral. Vibratory intact via tuning fork bilateral. Protective threshold with Semmes Wienstein monofilament intact to all pedal sites bilateral. Patellar and Achilles deep  tendon reflexes 2+ bilateral. No Babinski or clonus noted bilateral.   Musculoskeletal: No gross boney pedal deformities bilateral. No pain, crepitus, or limitation noted with foot and ankle range of motion bilateral. Muscular strength 5/5 in all groups tested bilateral.  Flexible pes planus is noted bilateral with hallux abductovalgus deformity which is flexible bilateral.  She has tenderness on palpation and range of motion of the first metatarsophalangeal joint left.  Left foot appears to have mild hallux limitus of the first metatarsophalangeal joint though radiographs do not demonstrate significant osteoarthritic process.  Gait: Unassisted, Nonantalgic.    Radiographs:  3 views of the left foot demonstrates an osseously mature individual with mild to moderate pes planus.  Increase in the first intermetatarsal angle greater than that of normal value hallux abductus angle greater than normal value.  No dorsal spurring is noted.  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  Plan: We discussed etiology pathology conservative versus surgical therapies.  We discussed the necessity for orthotics as well.  She will follow-up with me in a few months for surgical consult.  She would probably bring her husband at that time and we will look at x-rays as well.     Jimmy Plessinger T. [place], [place]

## 2017-11-14 ENCOUNTER — Telehealth: Payer: Self-pay | Admitting: Optometry

## 2017-11-14 ENCOUNTER — Ambulatory Visit: Payer: No Typology Code available for payment source | Admitting: Podiatry

## 2017-11-14 ENCOUNTER — Encounter: Payer: Self-pay | Admitting: Podiatry

## 2017-11-14 ENCOUNTER — Telehealth: Payer: Self-pay | Admitting: *Deleted

## 2017-11-14 DIAGNOSIS — M2012 Hallux valgus (acquired), left foot: Secondary | ICD-10-CM | POA: Diagnosis not present

## 2017-11-14 NOTE — Progress Notes (Signed)
She presents with her son today and her husband on face time.  She is here for surgical consult regarding her left foot.  She states that is becoming more more painful as time goes on and she feels that is necessary for her to be able to continue to work out on a regular basis.  She states that with her foot hurting is harder for her to do so.  She also states that she would like to have surgery but still be able to work out I expressed to her that this was not a possibility also discussed her travel schedule with her in great detail today.  I have reviewed her past medical history medications allergy surgeries and social history.  Objective: Vital signs are stable she is alert and oriented x3.  Pulses are palpable.  Neurologic sensorium is intact.  Deep tendon reflexes are intact.  Muscle strength +5/5 dorsiflexors plantar flexors inverters and everters all intrinsic musculature is intact.  Orthopedic evaluation demonstrates all joints distal to the ankle full range of motion without crepitation.  Cutaneous evaluation demonstrates supple well-hydrated cutis no erythema edema cellulitis drainage or odor no open lesions or wounds.  She does have a large nonpulsatile palpable mass medial aspect of the first metatarsophalangeal joint.  This appears to be a hypertrophic medial condyle of the head of the first metatarsal as well as an increase in the first intermetatarsal angle medial radiographs causing this deformity.  Hallux valgus deformity is noted today and is limited on range of motion which is resulting in a reactive hyperkeratosis to the medial aspect of the hallux interphalangeal joint.  Radiographs reviewed once again today do demonstrate an abnormally shaped head of the first metatarsal and an increase in the first intermetatarsal angle creating what appears to be early hallux limitus and hallux valgus deformity.  Assessment: Hallux abductovalgus deformity left foot.  Plan: Discussed etiology  pathology conservative versus surgical therapies at this point in time we discussed the surgery in great detail today [facility] double screw fixation.  I answered all the questions regarding these procedures in layman's terms.  She understood this was amenable to it and signed all 3 pages of the consent form today.  We dispensed a Cam walker as well as instructions information regarding the surgery center and anesthesia group.  She understands this and is amenable to that as well.  We will follow-up with her in the near future for surgical intervention.  Should she have questions or concerns today upon be directed to our office.

## 2017-11-14 NOTE — Telephone Encounter (Signed)
"  I need to reschedule my surgery.  I saw Dr. Al CorpusHyatt today and I scheduled for March 8 but that date is not good for me.  I'd like to do it on March 22."  He does not have anything available that week. He can do it the following week.  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  How about April 26?"  He does not have anything available on April 26.  "How about the following week?"  Yes, he can do it on May 3.  I will get it rescheduled.  "Is it okay for me to register now or should I wait until it gets closer to the date?"  Yes, you can register now.

## 2017-11-14 NOTE — Telephone Encounter (Signed)
"  I have to make a schedule change about my surgery.  Will you please give me a call.  I was just at Dr. Geryl RankinsHyatt's office today."

## 2017-11-14 NOTE — Patient Instructions (Signed)
Pre-Operative Instructions  Congratulations, you have decided to take an important step towards improving your quality of life.  You can be assured that the doctors and staff at [facility] will be with you every step of the way.  Here are some important things you should know:  1. Plan to be at the surgery center/hospital at least 1 (one) hour prior to your scheduled time, unless otherwise directed by the surgical center/hospital staff.  You must have a responsible adult accompany you, remain during the surgery and drive you home.  Make sure you have directions to the surgical center/hospital to ensure you arrive on time. 2. If you are having surgery at [facility], you will need a copy of your medical history and physical form from your family physician within one month prior to the date of surgery. We will give you a form for your primary physician to complete.  3. We make every effort to accommodate the date you request for surgery.  However, there are times where surgery dates or times have to be moved.  We will contact you as soon as possible if a change in schedule is required.   4. No aspirin/ibuprofen for one week before surgery.  If you are on aspirin, any non-steroidal anti-inflammatory medications (Mobic, Aleve, Ibuprofen) should not be taken seven (7) days prior to your surgery.  You make take Tylenol for pain prior to surgery.  5. Medications - If you are taking daily heart and blood pressure medications, seizure, reflux, allergy, asthma, anxiety, pain or diabetes medications, make sure you notify the surgery center/hospital before the day of surgery so they can tell you which medications you should take or avoid the day of surgery. 6. No food or drink after midnight the night before surgery unless directed otherwise by surgical center/hospital staff. 7. No alcoholic beverages 24-hours prior to surgery.  No smoking 24-hours prior or 24-hours after  surgery. 8. Wear loose pants or shorts. They should be loose enough to fit over bandages, boots, and casts. 9. Don't wear slip-on shoes. Sneakers are preferred. 10. Bring your boot with you to the surgery center/hospital.  Also bring crutches or a walker if your physician has prescribed it for you.  If you do not have this equipment, it will be provided for you after surgery. 11. If you have not been contacted by the surgery center/hospital by the day before your surgery, call to confirm the date and time of your surgery. 12. Leave-time from work may vary depending on the type of surgery you have.  Appropriate arrangements should be made prior to surgery with your employer. 13. Prescriptions will be provided immediately following surgery by your doctor.  Fill these as soon as possible after surgery and take the medication as directed. Pain medications will not be refilled on weekends and must be approved by the doctor. 14. Remove nail polish on the operative foot and avoid getting pedicures prior to surgery. 15. Wash the night before surgery.  The night before surgery wash the foot and leg well with water and the antibacterial soap provided. Be sure to pay special attention to beneath the toenails and in between the toes.  Wash for at least three (3) minutes. Rinse thoroughly with water and dry well with a towel.  Perform this wash unless told not to do so by your physician.  Enclosed: 1 Ice pack (please put in freezer the night before surgery)   1 Hibiclens skin cleaner     Pre-op instructions  If you have any questions regarding the instructions, please do not hesitate to call our office.  [place]: [address], [place], [place] [address] -- [phone]  [place]: [address] [place], [place] [address] -- [phone]  [place]: [address]  [place], [place] [address] -- [phone]  [place]: [address], [address], [place], [place] [address] -- [phone]  Website:  [email or web address] 

## 2017-11-14 NOTE — Telephone Encounter (Signed)
Entered in error

## 2018-01-12 ENCOUNTER — Telehealth: Payer: Self-pay | Admitting: *Deleted

## 2018-01-12 NOTE — Telephone Encounter (Signed)
"  I was calling because I have a couple of questions especially about my insurance and what my out of pocket will be.  Give me a call back."

## 2018-01-13 NOTE — Telephone Encounter (Signed)
"  I need to know where my surgery is going to be.  I need to know how much I will be responsible for once my insurance pays."  Your surgery will be done at [facility].  You should have a brochure in your bag from them.  I will get Chip BoerVicki in our insurance department to call you and give you the estimate.  You will have to call the surgical center to get their estimate as well as anesthesia.  Your procedure will take one hour.  "What is their number?"  Their number is [phone].    (Patient is having an [place] Bunionectomy left foot)

## 2018-02-16 ENCOUNTER — Other Ambulatory Visit: Payer: Self-pay | Admitting: Podiatry

## 2018-02-16 MED ORDER — CEPHALEXIN 500 MG PO CAPS: 500.0000 mg | ORAL_CAPSULE | Freq: Three times a day (TID) | ORAL | 0 refills | Status: DC

## 2018-02-16 MED ORDER — ONDANSETRON HCL 4 MG PO TABS: 4.0000 mg | ORAL_TABLET | Freq: Three times a day (TID) | ORAL | 0 refills | Status: DC | PRN

## 2018-02-16 MED ORDER — OXYCODONE-ACETAMINOPHEN 10-325 MG PO TABS
1.0000 | ORAL_TABLET | ORAL | 0 refills | Status: AC | PRN
Start: 2018-02-16 — End: 2018-02-23

## 2018-02-17 ENCOUNTER — Other Ambulatory Visit: Payer: Self-pay | Admitting: Podiatry

## 2018-02-17 ENCOUNTER — Telehealth: Payer: Self-pay | Admitting: *Deleted

## 2018-02-17 ENCOUNTER — Encounter: Payer: Self-pay | Admitting: Podiatry

## 2018-02-17 DIAGNOSIS — M2012 Hallux valgus (acquired), left foot: Secondary | ICD-10-CM | POA: Diagnosis not present

## 2018-02-17 MED ORDER — OXYCODONE HCL 10 MG PO TABS: 10.0000 mg | ORAL_TABLET | ORAL | 0 refills | Status: DC | PRN

## 2018-02-17 NOTE — Telephone Encounter (Signed)
Morrie [place] [facility] states she will cancel the percocet if she sees it come through their center.

## 2018-02-17 NOTE — Telephone Encounter (Signed)
Pt can't take the tylenol due to liver disease, can take the plain oxycodone. Dr. Samuella Cota states he will send the oxycodone to [facility].

## 2018-02-20 ENCOUNTER — Telehealth: Payer: Self-pay | Admitting: *Deleted

## 2018-02-20 ENCOUNTER — Ambulatory Visit (INDEPENDENT_AMBULATORY_CARE_PROVIDER_SITE_OTHER): Payer: No Typology Code available for payment source | Admitting: Family Medicine

## 2018-02-20 ENCOUNTER — Encounter: Payer: Self-pay | Admitting: Family Medicine

## 2018-02-20 ENCOUNTER — Other Ambulatory Visit: Payer: Self-pay | Admitting: Family Medicine

## 2018-02-20 VITALS — BP 112/74 | HR 82 | Temp 98.2°F | Ht 64.0 in | Wt 147.2 lb

## 2018-02-20 DIAGNOSIS — Z01419 Encounter for gynecological examination (general) (routine) without abnormal findings: Secondary | ICD-10-CM

## 2018-02-20 DIAGNOSIS — I10 Essential (primary) hypertension: Secondary | ICD-10-CM | POA: Diagnosis not present

## 2018-02-20 DIAGNOSIS — Z0000 Encounter for general adult medical examination without abnormal findings: Secondary | ICD-10-CM

## 2018-02-20 DIAGNOSIS — Z23 Encounter for immunization: Secondary | ICD-10-CM | POA: Diagnosis not present

## 2018-02-20 DIAGNOSIS — D509 Iron deficiency anemia, unspecified: Secondary | ICD-10-CM

## 2018-02-20 LAB — COMPREHENSIVE METABOLIC PANEL
ALT: 13 U/L (ref 0–35)
AST: 14 U/L (ref 0–37)
Albumin: 3.6 g/dL (ref 3.5–5.2)
Alkaline Phosphatase: 29 U/L — ABNORMAL LOW (ref 39–117)
BILIRUBIN TOTAL: 0.5 mg/dL (ref 0.2–1.2)
BUN: 11 mg/dL (ref 6–23)
CALCIUM: 9.8 mg/dL (ref 8.4–10.5)
CO2: 26 meq/L (ref 19–32)
CREATININE: 0.8 mg/dL (ref 0.40–1.20)
Chloride: 105 mEq/L (ref 96–112)
GFR: 101.4 mL/min (ref >60.00)
Glucose, Bld: 87 mg/dL (ref 70–99)
Potassium: 4.8 mEq/L (ref 3.5–5.1)
SODIUM: 138 meq/L (ref 135–145)
Total Protein: 6.9 g/dL (ref 6.0–8.3)

## 2018-02-20 LAB — CBC WITH DIFFERENTIAL/PLATELET
Basophils Absolute: 0 10*3/uL (ref 0.0–0.1)
Basophils Relative: 0.3 % (ref 0.0–3.0)
Eosinophils Absolute: 0.1 10*3/uL (ref 0.0–0.7)
Eosinophils Relative: 1.5 % (ref 0.0–5.0)
HCT: 35.8 % — ABNORMAL LOW (ref 36.0–46.0)
Hemoglobin: 11.6 g/dL — ABNORMAL LOW (ref 12.0–15.0)
Lymphocytes Relative: 32.4 % (ref 12.0–46.0)
Lymphs Abs: 2.7 10*3/uL (ref 0.7–4.0)
MCHC: 32.5 g/dL (ref 30.0–36.0)
MCV: 92.9 fl (ref 78.0–100.0)
Monocytes Absolute: 0.4 10*3/uL (ref 0.1–1.0)
Monocytes Relative: 5.3 % (ref 3.0–12.0)
Neutro Abs: 5.1 10*3/uL (ref 1.4–7.7)
Neutrophils Relative %: 60.5 % (ref 43.0–77.0)
Platelets: 286 10*3/uL (ref 150.0–400.0)
RBC: 3.86 Mil/uL — ABNORMAL LOW (ref 3.87–5.11)
RDW: 12.4 % (ref 11.5–15.5)
WBC: 8.4 10*3/uL (ref 4.0–10.5)

## 2018-02-20 LAB — LIPID PANEL
CHOL/HDL RATIO: 2
Cholesterol: 193 mg/dL (ref 0–200)
HDL: 88.1 mg/dL (ref >39.00)
LDL Cholesterol: 91 mg/dL (ref 0–99)
NONHDL: 105.18
TRIGLYCERIDES: 73 mg/dL (ref 0.0–149.0)
VLDL: 14.6 mg/dL (ref 0.0–40.0)

## 2018-02-20 LAB — TSH: TSH: 1.01 u[IU]/mL (ref 0.35–4.50)

## 2018-02-20 NOTE — Progress Notes (Signed)
Subjective:   Patient ID: Jodi Ross, female    DOB: September 17, 1976, 42 y.o.   MRN: [id number]  Jodi Ross is a pleasant 42 y.o. year old female who presents to clinic today with Annual Exam (CPE, labs completed.)  on 02/20/2018  HPI:  Health Maintenance  Topic Date Due  . [name]  08/19/1995  . INFLUENZA VACCINE  05/18/2018  . PAP SMEAR  06/02/2019  . HIV Screening  Completed   Nuva ring for contraception.  HTN- has been well controlled on Norvasc 10 mg daily. Denies HA, blurred vision, CP or SOB. Lab Results  Component Value Date   CREATININE 0.74 02/03/2016      Current Outpatient Medications on File Prior to Visit  Medication Sig Dispense Refill  . amLODipine (NORVASC) 10 MG tablet Take 1 tablet (10 mg total) by mouth daily. 90 tablet 3  . cephALEXin (KEFLEX) 500 MG capsule Take 1 capsule (500 mg total) by mouth 3 (three) times daily. 30 capsule 0  . etonogestrel-ethinyl estradiol (NUVARING) 0.12-0.015 MG/24HR vaginal ring Place 1 each vaginally every 28 (twenty-eight) days. Insert vaginally and leave in place for 3 consecutive weeks, then remove for 1 week.    . loratadine-pseudoephedrine (ALLERGY RELIEF/NASAL DECONGEST) 10-240 MG 24 hr tablet Take 1 tablet by mouth daily. 30 tablet 0  . ondansetron (ZOFRAN) 4 MG tablet Take 1 tablet (4 mg total) by mouth every 8 (eight) hours as needed for nausea or vomiting. 20 tablet 0  . Oxycodone HCl 10 MG TABS Take 1 tablet (10 mg total) by mouth every 4 (four) hours as needed. 30 tablet 0  . oxyCODONE-acetaminophen (PERCOCET) 10-325 MG tablet Take 1 tablet by mouth every 4 (four) hours as needed for up to 7 days for pain. (Patient not taking: Reported on 02/20/2018) 30 tablet 0   No current facility-administered medications on file prior to visit.     Allergies  Allergen Reactions  . Aspirin   . Sulfonamide Derivatives     Past Medical History:  Diagnosis Date  . Allergy   . Anemia, iron deficiency   . G6PD  (glucose 6 phosphatase deficiency)   . G6PD deficiency (HCC)     No past surgical history on file.  Family History  Problem Relation Age of Onset  . Lupus Mother   . Hypertension Father     Social History   Socioeconomic History  . Marital status: Married    Spouse name: Not on file  . Number of children: 2  . Years of education: Not on file  . Highest education level: Not on file  Occupational History  . Occupation: Teacher, adult education: [employer]  Social Needs  . Financial resource strain: Not on file  . Food insecurity:    Worry: Not on file    Inability: Not on file  . Transportation needs:    Medical: Not on file    Non-medical: Not on file  Tobacco Use  . Smoking status: Never Smoker  . Smokeless tobacco: Never Used  Substance and Sexual Activity  . Alcohol use: No  . Drug use: No  . Sexual activity: Not on file  Lifestyle  . Physical activity:    Days per week: Not on file    Minutes per session: Not on file  . Stress: Not on file  Relationships  . Social connections:    Talks on phone: Not on file    Gets together: Not on file  Attends religious service: Not on file    Active member of club or organization: Not on file    Attends meetings of clubs or organizations: Not on file    Relationship status: Not on file  . Intimate partner violence:    Fear of current or ex partner: Not on file    Emotionally abused: Not on file    Physically abused: Not on file    Forced sexual activity: Not on file  Other Topics Concern  . Not on file  Social History Narrative  . Not on file   The PMH, PSH, Social History, Family History, Medications, and allergies have been reviewed in [facility], and have been updated if relevant.  Review of Systems  Constitutional: Negative.   HENT: Negative.   Eyes: Negative.   Respiratory: Negative.   Cardiovascular: Negative.   Gastrointestinal: Negative.   Endocrine: Negative.   Genitourinary: Negative.  Negative for  difficulty urinating.  Musculoskeletal: Negative.   Skin: Negative.   Allergic/Immunologic: Negative.   Neurological: Negative.   Hematological: Negative.   Psychiatric/Behavioral: Negative.   All other systems reviewed and are negative.      Objective:    BP 112/74 (BP Location: Left Arm, Patient Position: Sitting, Cuff Size: Normal)   Pulse 82   Temp 98.2 F (36.8 C) (Oral)   Ht 5\' 4"  (1.626 m)   Wt 147 lb 3.2 oz (66.8 kg)   SpO2 99%   BMI 25.27 kg/m   Wt Readings from Last 3 Encounters:  02/20/18 147 lb 3.2 oz (66.8 kg)  09/19/17 148 lb 12.8 oz (67.5 kg)  02/03/16 154 lb 4 oz (70 kg)    Physical Exam   General:  Well-developed,well-nourished,in no acute distress; alert,appropriate and cooperative throughout examination Head:  normocephalic and atraumatic.   Eyes:  vision grossly intact, PERRL Ears:  R ear normal and L ear normal externally, TMs clear bilaterally Nose:  no external deformity.   Mouth:  good dentition.   Neck:  No deformities, masses, or tenderness noted. Breasts:  No mass, nodules, thickening, tenderness, bulging, retraction, inflamation, nipple discharge or skin changes noted.   Lungs:  Normal respiratory effort, chest expands symmetrically. Lungs are clear to auscultation, no crackles or wheezes. Heart:  Normal rate and regular rhythm. S1 and S2 normal without gallop, murmur, click, rub or other extra sounds. Abdomen:  Bowel sounds positive,abdomen soft and non-tender without masses, organomegaly or hernias noted. Msk:  No deformity or scoliosis noted of thoracic or lumbar spine.   Extremities:  No clubbing, cyanosis, edema, or deformity noted with normal full range of motion of all joints.   Neurologic:  alert & oriented X3 and gait normal.   Skin:  Intact without suspicious lesions or rashes Cervical Nodes:  No lymphadenopathy noted Axillary Nodes:  No palpable lymphadenopathy Psych:  Cognition and judgment appear intact. Alert and cooperative  with normal attention span and concentration. No apparent delusions, illusions, hallucinations       Assessment & Plan:   Iron deficiency anemia, unspecified iron deficiency anemia type - Plan: CBC with Differential/Platelet  Well woman exam - Plan: Comprehensive metabolic panel, Lipid panel  Essential hypertension - Plan: TSH  Need for Tdap vaccination - Plan: Tdap vaccine greater than or equal to 7yo IM No follow-ups on file.

## 2018-02-20 NOTE — Patient Instructions (Signed)
Great to see you. I will call you with your lab results from today and you can view them online.   

## 2018-02-20 NOTE — Assessment & Plan Note (Signed)
Well controlled on current dose of Norvasc. No changes made today.

## 2018-02-20 NOTE — Assessment & Plan Note (Signed)
Improved with nuva ring. CBC today.

## 2018-02-20 NOTE — Assessment & Plan Note (Signed)
Reviewed preventive care protocols, scheduled due services, and updated immunizations Discussed nutrition, exercise, diet, and healthy lifestyle.  Orders Placed This Encounter  Procedures  . CBC with Differential/Platelet  . Comprehensive metabolic panel  . Lipid panel  . TSH     

## 2018-02-20 NOTE — Progress Notes (Signed)
Orders entered

## 2018-02-20 NOTE — Telephone Encounter (Signed)
POST OP CALL-    1) General condition stated by the patient: Good  2) Is the pt having pain? "I had a lot of pain yesterday after the block wore off but much better today"  3) Pain score: 0  4) Has the pt taken Rx'd medication? Yesterday only, Ibuprofen currently  5) Is the pain medication giving relief? Yes  6) Any fever, chills, nausea, or vomiting? No  7) Any shortness of breath or tightness in the calf? No  8) Is the bandages clean, dry and intact? Yes  9) Is the bandage excessively tight? No   10) Is there excessive bleeding or drainage coming through the bandage? No  11) Did you understand all of the post op instruction sheet given? Yes  12) Any questions or concerns regarding post op care/recovery? No    Confirmed POV appointment with patient

## 2018-02-22 ENCOUNTER — Encounter: Payer: No Typology Code available for payment source | Admitting: Podiatry

## 2018-02-23 ENCOUNTER — Ambulatory Visit (INDEPENDENT_AMBULATORY_CARE_PROVIDER_SITE_OTHER): Payer: No Typology Code available for payment source | Admitting: Podiatry

## 2018-02-23 ENCOUNTER — Encounter: Payer: Self-pay | Admitting: Podiatry

## 2018-02-23 ENCOUNTER — Ambulatory Visit (INDEPENDENT_AMBULATORY_CARE_PROVIDER_SITE_OTHER): Payer: No Typology Code available for payment source

## 2018-02-23 VITALS — BP 134/89 | HR 93 | Resp 16

## 2018-02-23 DIAGNOSIS — M2012 Hallux valgus (acquired), left foot: Secondary | ICD-10-CM | POA: Diagnosis not present

## 2018-02-23 NOTE — Progress Notes (Signed)
She presents today for her first postop visit date of surgery 02/17/2018 status post [facility] bunionectomy left.  States is not too bad the worst day was Sunday.  She has not had to take any narcotics but continues to take ibuprofen occasionally.  Objective: Vital signs are stable alert oriented x3 dry sterile dressing intact was removed demonstrates no erythema mild edema no cellulitis drainage or odor sutures are intact margins well coapted at this point.  She has good range of motion active and passive and radiographs demonstrate capital osteotomy in good position with internal fixation in good position.  Assessment: Well-healing surgical foot status post 1 week.  Plan: Redressed the left foot today with a dry sterile compressive dressing and I will follow-up with her in 1 week.

## 2018-03-08 ENCOUNTER — Encounter: Payer: No Typology Code available for payment source | Admitting: Podiatry

## 2018-03-09 ENCOUNTER — Ambulatory Visit (INDEPENDENT_AMBULATORY_CARE_PROVIDER_SITE_OTHER): Payer: No Typology Code available for payment source | Admitting: Podiatry

## 2018-03-09 ENCOUNTER — Encounter: Payer: Self-pay | Admitting: Podiatry

## 2018-03-09 DIAGNOSIS — M2012 Hallux valgus (acquired), left foot: Secondary | ICD-10-CM | POA: Diagnosis not present

## 2018-03-09 DIAGNOSIS — M2011 Hallux valgus (acquired), right foot: Secondary | ICD-10-CM

## 2018-03-09 NOTE — Progress Notes (Signed)
She presents today date of surgery Feb 17 2018 status post [facility] bunionectomy left.  States my foot is feeling better but my ankle swells at times.  She states that she is been up on it and has bumped her toe a couple of times.  She states that is still swollen and is harder to move.  Objective: Vital signs are stable she is alert and oriented x3.  Pulses are palpable.  She has good range of motion of the first metatarsal phalangeal joint margins are well coapted there is minimal edema she has no crepitation on range of motion.  Assessment: Well-healing surgical foot 2 weeks.  Plan: Remove all of the stitches today placed her in a compression anklet in a [employer] shoe we will follow-up with her in 2 to 3 weeks.  My allow her to start getting this wet.  Encourage range of motion activities.

## 2018-03-23 ENCOUNTER — Encounter: Payer: Self-pay | Admitting: Podiatry

## 2018-03-23 ENCOUNTER — Ambulatory Visit (INDEPENDENT_AMBULATORY_CARE_PROVIDER_SITE_OTHER): Payer: No Typology Code available for payment source

## 2018-03-23 ENCOUNTER — Ambulatory Visit (INDEPENDENT_AMBULATORY_CARE_PROVIDER_SITE_OTHER): Payer: No Typology Code available for payment source | Admitting: Podiatry

## 2018-03-23 ENCOUNTER — Encounter: Payer: No Typology Code available for payment source | Admitting: Podiatry

## 2018-03-23 DIAGNOSIS — M2012 Hallux valgus (acquired), left foot: Secondary | ICD-10-CM | POA: Diagnosis not present

## 2018-03-23 NOTE — Progress Notes (Signed)
She presents today for follow-up of her [place] bunionectomy left foot.  Date of surgery 02/17/2018.  States that is feeling good just wondering if I can get into a shoe yet.  Objective: Vital signs are stable alert oriented x3 she is 1 month status post [place] bunion repair with mild plantar flexion secondary to the elevated first metatarsal.  She has good plantar flexion but is tight on dorsiflexion on physical exam.  Radiographs taken demonstrate well healing osteotomy plantarflexed.  Assessment: Well-healing surgical foot.  Her graph plan: I am going to request that she try to get into regular shoe gear over the next couple of weeks if she is not doing better in 2 weeks and we will consider physical therapy as far as range of motion goes.

## 2018-04-06 ENCOUNTER — Ambulatory Visit (INDEPENDENT_AMBULATORY_CARE_PROVIDER_SITE_OTHER): Payer: No Typology Code available for payment source

## 2018-04-06 ENCOUNTER — Encounter: Payer: Self-pay | Admitting: Podiatry

## 2018-04-06 ENCOUNTER — Ambulatory Visit (INDEPENDENT_AMBULATORY_CARE_PROVIDER_SITE_OTHER): Payer: No Typology Code available for payment source | Admitting: Podiatry

## 2018-04-06 ENCOUNTER — Telehealth: Payer: Self-pay | Admitting: *Deleted

## 2018-04-06 DIAGNOSIS — Z9889 Other specified postprocedural states: Secondary | ICD-10-CM

## 2018-04-06 DIAGNOSIS — M2012 Hallux valgus (acquired), left foot: Secondary | ICD-10-CM | POA: Diagnosis not present

## 2018-04-06 NOTE — Telephone Encounter (Signed)
-----   Message from Kristian CoveyAshley E Prevette, [facility] sent at 04/06/2018  1:07 PM EDT ----- Regarding: PT PT referral at [facility] in [place] on [address]-  S/p bunionectomy left - DOS 02-17-18  Duration- 3 x weekly x 4 weeks  Evaluate and treat - increase ROM

## 2018-04-06 NOTE — Progress Notes (Signed)
She presents today for follow-up of her [facility] bunionectomy left.  States that is doing fine is not really been sore too much other than this morning.  She states that it feels like it has needs to be popped and it does not move as well as I had hoped.  Objective: Vital signs are stable she is alert and oriented x3.  She has limited range of motion dorsiflexion and plantarflexion but there is a good divergence of motion present.  It is just not enough on either end.  Radiographs taken today do demonstrate well healing osteotomy.  She is back into regular shoe gear.  Assessment: Well-healing surgical foot.  Plan: Follow-up with me in 1 month but I will send her to physical therapy in [place].

## 2018-04-06 NOTE — Telephone Encounter (Signed)
Faxed required form and demographics to [facility].

## 2018-04-07 ENCOUNTER — Telehealth: Payer: Self-pay | Admitting: Podiatry

## 2018-04-07 DIAGNOSIS — Z9889 Other specified postprocedural states: Secondary | ICD-10-CM

## 2018-04-07 NOTE — Telephone Encounter (Signed)
I asked pt if she would like to be seen at [facility], since she was in [place]. Pt states she didn't know if [facility] took the Focus Plan. I told her that I thought they would since it was a [facility] [employer] insurance and [facility] was [facility]. I told her I would order at [facility].

## 2018-04-07 NOTE — Addendum Note (Signed)
Addended by: Alphia Kava'CONNELL, VALERY D on: 04/07/2018 11:20 AM   Modules accepted: Orders

## 2018-04-07 NOTE — Telephone Encounter (Signed)
[facility] does not accept the [facility] focus plan. They said you may want to get them scheduled with someone at [facility]

## 2018-04-18 ENCOUNTER — Encounter: Payer: Self-pay | Admitting: Physical Therapy

## 2018-04-18 ENCOUNTER — Ambulatory Visit: Payer: No Typology Code available for payment source | Attending: Podiatry | Admitting: Physical Therapy

## 2018-04-18 ENCOUNTER — Telehealth: Payer: Self-pay | Admitting: Podiatry

## 2018-04-18 DIAGNOSIS — R2689 Other abnormalities of gait and mobility: Secondary | ICD-10-CM | POA: Diagnosis present

## 2018-04-18 DIAGNOSIS — M25675 Stiffness of left foot, not elsewhere classified: Secondary | ICD-10-CM | POA: Insufficient documentation

## 2018-04-18 NOTE — Therapy (Signed)
joint mobility and treat as needed, strength testing BLE    PT Home Exercise Plan  Ice, scar massage, AROM ankle and toes, Toe F and E stretching, grastroc and soleus runner's stretch    Recommended Other Services  none at this time    Consulted and Agree with Plan of Care  Patient       Patient will benefit from skilled therapeutic intervention in order to improve the following deficits and impairments:  Abnormal gait, Decreased balance, Decreased endurance, Decreased range of motion, Decreased mobility, Decreased scar mobility, Decreased strength, Difficulty walking, Hypomobility, Increased edema, Increased fascial restricitons, Increased muscle spasms, Impaired perceived functional ability, Impaired flexibility, Improper body  mechanics, Pain  Visit Diagnosis: Stiffness of left foot, not elsewhere classified  Other abnormalities of gait and mobility     Problem List Patient Active Problem List   Diagnosis Date Noted  . Well woman exam 02/20/2018  . 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 Alopecia 09/20/2013  . Menstrual cramps 11/11/2011  . History of miscarriage 02/04/2011  . ANEMIA-IRON DEFICIENCY 08/24/2010  . OTHER SPECIFIED ANEMIAS 08/24/2010  . Essential hypertension 08/24/2010  . ALLERGIC RHINITIS 08/24/2010    Jodi Ross PT, DPT 04/18/2018, 11:57 AM  [place] MAIN [facility] SERVICES [address] [place], [place], [address] Phone: [phone]   Fax:  [phone]  Name: Jodi Ross MRN: [id number] Date of Birth: 1975/10/24  [facility] MAIN J[facility] SERVICES [address] [place], [place], [address] Phone: [phone]   Fax:  [phone]  Physical Therapy Evaluation  Patient Details  Name: Jodi Ross MRN: [id number] Date of Birth: 11-Apr-1976 Referring Provider: Elinor Parkinson   Encounter Date: 04/18/2018  PT End of Session - 04/18/18 1145    Visit Number  1    Number of Visits  9    Date for PT Re-Evaluation  05/16/18    PT Start Time  0801    PT Stop Time  0859    PT Time Calculation (min)  58 min    Activity Tolerance  Patient tolerated treatment well    Behavior During Therapy  [facility] for tasks assessed/performed       Past Medical History:  Diagnosis Date  . Allergy   . Anemia, iron deficiency   . G6PD (glucose 6 phosphatase deficiency)   . G6PD deficiency (HCC)     History reviewed. No pertinent surgical history.  There were no vitals filed for this visit.   Subjective Assessment - 04/18/18 1134    Subjective  Pt reports tightness and stiffness in L ankle/foot/toes    Pertinent History  Pt is a 42 y/o F s/p L bunionectomy 02/17/18 (pin in 1st metatarsal). Pt says her big toe is stiff and pt is having trouble curling her toes.  Yesterday pt walked on the treadmill for the first time since surgery and says it took her longer to walk a mile (19 minutes) than before ( ) and feels somewhat off balance. Pt feels that she doesn't get the bend in her great toe like she should when walking.  Goals: to get back to treadmill at normal pace, elliptical, decrease stiffness.  Current pain: 0/10 Worst pain: 6/10.  Has only felt pain ~4x since surgery.  Pt denies having pain before the surgery but had to keep getting bigger shoes to accommodate bunion.   Pt's first shift back at work was last night (works night shift).  Pt had been on light duty for 3 weeks prior to this.  Pt denies numbness or tingling.  Pt denies any prior surgery or injury to ankle, foot, toes except  for possible self diagnosed plantar fasciitis (unsure if it was on this side) but took an anti-inflammatory and it went away. Pt in a CAM boot for 4 weeks following surgery, then wore post op shoe for 2 weeks.  Pt has been wearing normal shoes since 6/14.      Limitations  Walking    Diagnostic tests  see chart for imaging results    Patient Stated Goals  see above    Currently in Pain?  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 [place], [place], [address] Phone: [phone]   Fax:  [phone]  Physical Therapy Evaluation  Patient Details  Name: Jodi Ross MRN: [id number] Date of Birth: 11-Apr-1976 Referring Provider: Elinor Parkinson   Encounter Date: 04/18/2018  PT End of Session - 04/18/18 1145    Visit Number  1    Number of Visits  9    Date for PT Re-Evaluation  05/16/18    PT Start Time  0801    PT Stop Time  0859    PT Time Calculation (min)  58 min    Activity Tolerance  Patient tolerated treatment well    Behavior During Therapy  [facility] for tasks assessed/performed       Past Medical History:  Diagnosis Date  . Allergy   . Anemia, iron deficiency   . G6PD (glucose 6 phosphatase deficiency)   . G6PD deficiency (HCC)     History reviewed. No pertinent surgical history.  There were no vitals filed for this visit.   Subjective Assessment - 04/18/18 1134    Subjective  Pt reports tightness and stiffness in L ankle/foot/toes    Pertinent History  Pt is a 42 y/o F s/p L bunionectomy 02/17/18 (pin in 1st metatarsal). Pt says her big toe is stiff and pt is having trouble curling her toes.  Yesterday pt walked on the treadmill for the first time since surgery and says it took her longer to walk a mile (19 minutes) than before ( ) and feels somewhat off balance. Pt feels that she doesn't get the bend in her great toe like she should when walking.  Goals: to get back to treadmill at normal pace, elliptical, decrease stiffness.  Current pain: 0/10 Worst pain: 6/10.  Has only felt pain ~4x since surgery.  Pt denies having pain before the surgery but had to keep getting bigger shoes to accommodate bunion.   Pt's first shift back at work was last night (works night shift).  Pt had been on light duty for 3 weeks prior to this.  Pt denies numbness or tingling.  Pt denies any prior surgery or injury to ankle, foot, toes except  for possible self diagnosed plantar fasciitis (unsure if it was on this side) but took an anti-inflammatory and it went away. Pt in a CAM boot for 4 weeks following surgery, then wore post op shoe for 2 weeks.  Pt has been wearing normal shoes since 6/14.      Limitations  Walking    Diagnostic tests  see chart for imaging results    Patient Stated Goals  see above    Currently in Pain?  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[employer], lifting, standing, sitting, typing    Leisure  joint mobility and treat as needed, strength testing BLE    PT Home Exercise Plan  Ice, scar massage, AROM ankle and toes, Toe F and E stretching, grastroc and soleus runner's stretch    Recommended Other Services  none at this time    Consulted and Agree with Plan of Care  Patient       Patient will benefit from skilled therapeutic intervention in order to improve the following deficits and impairments:  Abnormal gait, Decreased balance, Decreased endurance, Decreased range of motion, Decreased mobility, Decreased scar mobility, Decreased strength, Difficulty walking, Hypomobility, Increased edema, Increased fascial restricitons, Increased muscle spasms, Impaired perceived functional ability, Impaired flexibility, Improper body  mechanics, Pain  Visit Diagnosis: Stiffness of left foot, not elsewhere classified  Other abnormalities of gait and mobility     Problem List Patient Active Problem List   Diagnosis Date Noted  . Well woman exam 02/20/2018  . 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 Alopecia 09/20/2013  . Menstrual cramps 11/11/2011  . History of miscarriage 02/04/2011  . ANEMIA-IRON DEFICIENCY 08/24/2010  . OTHER SPECIFIED ANEMIAS 08/24/2010  . Essential hypertension 08/24/2010  . ALLERGIC RHINITIS 08/24/2010    Jodi Ross PT, DPT 04/18/2018, 11:57 AM  [place] MAIN [facility] SERVICES [address] [place], [place], [address] Phone: [phone]   Fax:  [phone]  Name: Jodi Ross MRN: [id number] Date of Birth: 1975/10/24

## 2018-04-18 NOTE — Telephone Encounter (Signed)
Patient wants to know if she needs to finish therapy for the month Dr Al CorpusHyatt request before she comes back to see him or does she need to come in for her appointment on 05/04/18? Please call patient back at [phone]

## 2018-04-18 NOTE — Telephone Encounter (Signed)
I told pt Dr. Al CorpusHyatt would want to see her after the PT therapies. Pt states PT did not start until 2 weeks after her last appt with Dr. Al CorpusHyatt. I told pt to keep the 05/04/2018 appt, because he would want to make sure the surgery was progressing properly, but optimum time she would have been able to start PT right away. Pt states understanding and will keep the 05/04/2018 appt.

## 2018-04-25 ENCOUNTER — Encounter: Payer: Self-pay | Admitting: Physical Therapy

## 2018-04-25 ENCOUNTER — Ambulatory Visit: Payer: No Typology Code available for payment source | Admitting: Physical Therapy

## 2018-04-25 DIAGNOSIS — M25675 Stiffness of left foot, not elsewhere classified: Secondary | ICD-10-CM | POA: Diagnosis not present

## 2018-04-25 DIAGNOSIS — R2689 Other abnormalities of gait and mobility: Secondary | ICD-10-CM

## 2018-04-25 NOTE — Therapy (Signed)
[facility] MAIN [facility] SERVICES [address] [place], [place], [address] Phone: [phone]   Fax:  [phone]  Physical Therapy Treatment  Patient Details  Name: Jodi Ross MRN: [id number] Date of Birth: [birth year] Referring Provider: Elinor Parkinson   Encounter Date: 04/25/2018  PT End of Session - 04/25/18 0802    Visit Number  2    Number of Visits  9    Date for PT Re-Evaluation  05/16/18    PT Start Time  0801    PT Stop Time  0850    PT Time Calculation (min)  49 min    Activity Tolerance  Patient tolerated treatment well    Behavior During Therapy  [facility] for tasks assessed/performed       Past Medical History:  Diagnosis Date  . Allergy   . Anemia, iron deficiency   . G6PD (glucose 6 phosphatase deficiency)   . G6PD deficiency (HCC)     History reviewed. No pertinent surgical history.  There were no vitals filed for this visit.  Subjective Assessment - 04/25/18 0804    Subjective  Pt reports she went to [place] for 4th of July and says that as long as she had her sneakers on she did not have any pain.  However, if she walked in her sandals too long she experienced pain.  Pt says she was not consistent with her HEP.  Has been working on curling her feet and stretching.     Pertinent History  Pt is a 42 y/o F s/p L bunionectomy 02/17/18 (pin in 1st metatarsal). Pt says her big toe is stiff and pt is having trouble curling her toes.  Yesterday pt walked on the treadmill for the first time since surgery and says it took her longer to walk a mile (19 minutes) than before ( ) and feels somewhat off balance. Pt feels that she doesn't get the bend in her great toe like she should when walking.  Goals: to get back to treadmill at normal pace, elliptical, decrease stiffness.  Current pain: 0/10 Worst pain: 6/10.  Has only felt pain ~4x since surgery.  Pt denies having pain before the surgery but had to keep getting bigger shoes to  accommodate bunion.   Pt's first shift back at work was last night (works night shift).  Pt had been on light duty for 3 weeks prior to this.  Pt denies numbness or tingling.  Pt denies any prior surgery or injury to ankle, foot, toes except for possible self diagnosed plantar fasciitis (unsure if it was on this side) but took an anti-inflammatory and it went away. Pt in a CAM boot for 4 weeks following surgery, then wore post op shoe for 2 weeks.  Pt has been wearing normal shoes since 6/14.      Limitations  Walking    Diagnostic tests  see chart for imaging results    Patient Stated Goals  see above    Currently in Pain?  No/denies         [facility] PT Assessment - 04/25/18 0813      ROM / Strength   AROM / PROM / Strength  Strength      Strength   Overall Strength  Deficits    Strength Assessment Site  Hip;Knee;Ankle    Right/Left Hip  Right;Left    Right Hip Flexion  5/5    Right Hip Extension  3/5    Right Hip External Rotation  5/5    Right Hip Internal Rotation  5/5    Right Hip ABduction  5/5    Right Hip ADduction  5/5    Left Hip Flexion  5/5    Left Hip Extension  3/5    Left Hip External Rotation  5/5    Left Hip Internal Rotation  4/5    Left Hip ABduction  5/5    Left Hip ADduction  5/5    Right/Left Knee  Right;Left    Right Knee Flexion  5/5    Right Knee Extension  5/5    Left Knee Flexion  5/5    Left Knee Extension  5/5    Right/Left Ankle  Right;Left    Right Ankle Dorsiflexion  5/5    Right Ankle Plantar Flexion  5/5    Right Ankle Inversion  5/5    Right Ankle Eversion  5/5    Left Ankle Dorsiflexion  5/5    Left Ankle Plantar Flexion  5/5    Left Ankle Inversion  5/5    Left Ankle Eversion  5/5        TREATMENT   LEFS: 72/80   Bil glute max strength: 3/5   Scar massage, specifically to distal scar   Intermetatarsal grade III joint mobs 5x30 seconds. Between 1st and 2nd, 2nd and 3rd, and 3rd and 4th metatarsals   Grade IV L AP talocrural  joint mobs 5x30 seconds   Grade IV PA talocrural joint mob with distraction and DF stretch x15 in supine   Great toe, 2nd toe, and 3rd toe AP and PA grade III joint mobs with distraction at MTP and PIP joints. 2x30 seconds each segment   Assessment of subtalar joint mobility: moderate hypomobility medially and laterally   Lateral to medial grade IV joint subtalar mobilizations to L foot   Medial to lateral grade IV joint subtalar mobilizations to L foot   Pt performs passive stretch to all L five toes into F with 2x30 second hold (added to HEP)   Pt performs passive stretch to all L five toes into E with 2x30 second hold (added to HEP)   MWM L DF on 6" step with overpressure from the PT at subtalar joint x15                     PT Education - 04/25/18 0802    Education Details  Exercise technique    Person(s) Educated  Patient    Methods  Explanation;Demonstration;Verbal cues    Comprehension  Verbalized understanding;Returned demonstration;Verbal cues required;Need further instruction       PT Short Term Goals - 04/18/18 1149      PT SHORT TERM GOAL #1   Title  Pt will independently complete HEP at least 4 days/wk for improved carryover between sessions    Time  2    Period  Weeks    Status  New        PT Long Term Goals - 04/18/18 1150      PT LONG TERM GOAL #1   Title  Pt's L ankle AROM will improve to WNL for improved gait mechanics    Baseline   DF: 5 PF: 46 Eversion: 5 Inversion: 25    Time  4    Period  Weeks    Status  New      PT LONG TERM GOAL #2   Title  Pt's L great toe AROM will improve to WNL for  improved gait mechanics    Baseline  Great toe MTP joint F: 10 E: 29 Great toe PIP joint F: 21 E: 10    Time  4    Period  Weeks    Status  New      PT LONG TERM GOAL #3   Title  Pt's effusion tape measurement will be equal BLE to demonstrate decreased swelling    Baseline   19.5 cm on the R 20.5 cm on the L       Time  3    Period   Weeks    Status  New      PT LONG TERM GOAL #4   Title  Pt will be able to ambulate 1 mile in equal to or less than 17 minutes for return to PLOF    Baseline  19 minutes    Time  4    Period  Weeks    Status  New            Plan - 04/25/18 6761    Clinical Impression Statement  Pt has not been adherent to HEP and the importance of this was reinforced this session.  Focus this session was on manual therapy techniques to improve joint mobility throughout anke, foot, and toes followed by MWM in an effort to decrease stiffness and pain while ambulating.  The pt denies pain throughout session.  Pt will benefit from continued skilled PT interventions for improved mobility and QOL.     Rehab Potential  Good    PT Frequency  2x / week    PT Duration  4 weeks    PT Treatment/Interventions  ADLs/Self Care Home Management;Aquatic Therapy;Biofeedback;Cryotherapy;Electrical Stimulation;Iontophoresis 4mg /ml Dexamethasone;Moist Heat;Traction;Ultrasound;Parrafin;Contrast Bath;DME Instruction;Gait training;Stair training;Functional mobility training;Therapeutic activities;Therapeutic exercise;Balance training;Neuromuscular re-education;Patient/family education;Orthotic Fit/Training;Manual techniques;Compression bandaging;Scar mobilization;Passive range of motion;Dry needling;Energy conservation;Taping;Splinting    PT Next Visit Plan  have pt fill out LEFS, continue joint mobilizations and scar massage, assess L subtalar joint mobility and treat as needed, strength testing BLE    PT Home Exercise Plan  Ice, scar massage, AROM ankle and toes, Toe F and E stretching, grastroc and soleus runner's stretch    Consulted and Agree with Plan of Care  Patient       Patient will benefit from skilled therapeutic intervention in order to improve the following deficits and impairments:  Abnormal gait, Decreased balance, Decreased endurance, Decreased range of motion, Decreased mobility, Decreased scar mobility,  Decreased strength, Difficulty walking, Hypomobility, Increased edema, Increased fascial restricitons, Increased muscle spasms, Impaired perceived functional ability, Impaired flexibility, Improper body mechanics, Pain  Visit Diagnosis: Stiffness of left foot, not elsewhere classified  Other abnormalities of gait and mobility     Problem List Patient Active Problem List   Diagnosis Date Noted  . Well woman exam 02/20/2018  . 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 Alopecia 09/20/2013  . Menstrual cramps 11/11/2011  . History of miscarriage 02/04/2011  . ANEMIA-IRON DEFICIENCY 08/24/2010  . OTHER SPECIFIED ANEMIAS 08/24/2010  . Essential hypertension 08/24/2010  . ALLERGIC RHINITIS 08/24/2010    Encarnacion Chu PT, DPT 04/25/2018, 8:53 AM  [facility] MAIN [facility] SERVICES [address] [place], [place], [address] Phone: [phone]   Fax:  [phone]  Name: Jodi Ross MRN: [id number] Date of Birth: 02/18/1976

## 2018-04-27 ENCOUNTER — Ambulatory Visit: Payer: No Typology Code available for payment source | Admitting: Physical Therapy

## 2018-04-27 DIAGNOSIS — M25675 Stiffness of left foot, not elsewhere classified: Secondary | ICD-10-CM

## 2018-04-27 DIAGNOSIS — R2689 Other abnormalities of gait and mobility: Secondary | ICD-10-CM

## 2018-04-27 NOTE — Therapy (Signed)
[facility] MAIN [facility] SERVICES [address] [place], [place], [address] Phone: [phone]   Fax:  [phone]  Physical Therapy Treatment  Patient Details  Name: Jodi Ross MRN: [id number] Date of Birth: [birth year] Referring Provider: Elinor Parkinson   Encounter Date: 04/27/2018  PT End of Session - 04/27/18 0857    Visit Number  3    Number of Visits  9    Date for PT Re-Evaluation  05/16/18    Activity Tolerance  Patient tolerated treatment well    Behavior During Therapy  [facility] for tasks assessed/performed       Past Medical History:  Diagnosis Date  . Allergy   . Anemia, iron deficiency   . G6PD (glucose 6 phosphatase deficiency)   . G6PD deficiency (HCC)     No past surgical history on file.  There were no vitals filed for this visit.  Subjective Assessment - 04/27/18 0856    Subjective  Pt reports she went to [place] for 4th of July and says that as long as she had her sneakers on she did not have any pain.  However, if she walked in her sandals too long she experienced pain.  Pt says she was not consistent with her HEP.  Has been working on curling her feet and stretching.     Pertinent History  Pt is a 42 y/o F s/p L bunionectomy 02/17/18 (pin in 1st metatarsal). Pt says her big toe is stiff and pt is having trouble curling her toes.  Yesterday pt walked on the treadmill for the first time since surgery and says it took her longer to walk a mile (19 minutes) than before ( ) and feels somewhat off balance. Pt feels that she doesn't get the bend in her great toe like she should when walking.  Goals: to get back to treadmill at normal pace, elliptical, decrease stiffness.  Current pain: 0/10 Worst pain: 6/10.  Has only felt pain ~4x since surgery.  Pt denies having pain before the surgery but had to keep getting bigger shoes to accommodate bunion.   Pt's first shift back at work was last night (works night shift).  Pt had been  on light duty for 3 weeks prior to this.  Pt denies numbness or tingling.  Pt denies any prior surgery or injury to ankle, foot, toes except for possible self diagnosed plantar fasciitis (unsure if it was on this side) but took an anti-inflammatory and it went away. Pt in a CAM boot for 4 weeks following surgery, then wore post op shoe for 2 weeks.  Pt has been wearing normal shoes since 6/14.      Limitations  Walking    Diagnostic tests  see chart for imaging results    Patient Stated Goals  see above    Currently in Pain?  No/denies    Multiple Pain Sites  No       TREATMENT   BABPs board with 2nd level CW, CCW, DF/PF, EVE/INV x 20  Scar massage, specifically to distal scar left great MT   Intermetatarsal grade III joint mobs 5x30 seconds. Between 1st and 2nd, 2nd and 3rd, and 3rd and 4th metatarsals   Grade IV L AP talocrural joint mobs 5x30 seconds   Grade IV PA talocrural joint mob with distraction and DF stretch x15 in supine   Great toe, 2nd toe, and 3rd toe AP and PA grade III joint mobs with distraction at MTP and  PIP joints. 2x30 seconds each segment    Patient has some swelling and mild discomfort and reports stiffness in flex of left great toe and she reports no increased pain following treatment.                             PT Education - 04/27/18 0903    Education Details  HEP    Person(s) Educated  Patient    Methods  Explanation    Comprehension  Verbalized understanding       PT Short Term Goals - 04/18/18 1149      PT SHORT TERM GOAL #1   Title  Pt will independently complete HEP at least 4 days/wk for improved carryover between sessions    Time  2    Period  Weeks    Status  New        PT Long Term Goals - 04/18/18 1150      PT LONG TERM GOAL #1   Title  Pt's L ankle AROM will improve to WNL for improved gait mechanics    Baseline   DF: 5 PF: 46 Eversion: 5 Inversion: 25    Time  4    Period  Weeks    Status  New       PT LONG TERM GOAL #2   Title  Pt's L great toe AROM will improve to WNL for improved gait mechanics    Baseline  Great toe MTP joint F: 10 E: 29 Great toe PIP joint F: 21 E: 10    Time  4    Period  Weeks    Status  New      PT LONG TERM GOAL #3   Title  Pt's effusion tape measurement will be equal BLE to demonstrate decreased swelling    Baseline   19.5 cm on the R 20.5 cm on the L       Time  3    Period  Weeks    Status  New      PT LONG TERM GOAL #4   Title  Pt will be able to ambulate 1 mile in equal to or less than 17 minutes for return to PLOF    Baseline  19 minutes    Time  4    Period  Weeks    Status  New            Plan - 04/27/18 0859    Clinical Impression Statement  Patient performs AROM and therapeutic exercises in close chain positions without pain. She reports that she is doing her HEP and she is able to walk her normal walking distances but she is not able to walk on the TM and she is not able to flex her great toe into full extension or flexion. She willl continue to benefit from skilled PT to improve strength and ROM of foot.     Rehab Potential  Good    PT Frequency  2x / week    PT Duration  4 weeks    PT Treatment/Interventions  ADLs/Self Care Home Management;Aquatic Therapy;Biofeedback;Cryotherapy;Electrical Stimulation;Iontophoresis 4mg /ml Dexamethasone;Moist Heat;Traction;Ultrasound;Parrafin;Contrast Bath;DME Instruction;Gait training;Stair training;Functional mobility training;Therapeutic activities;Therapeutic exercise;Balance training;Neuromuscular re-education;Patient/family education;Orthotic Fit/Training;Manual techniques;Compression bandaging;Scar mobilization;Passive range of motion;Dry needling;Energy conservation;Taping;Splinting    PT Next Visit Plan  have pt fill out LEFS, continue joint mobilizations and scar massage, assess L subtalar joint mobility and treat as needed, strength testing BLE    PT  Home Exercise Plan  Ice, scar massage,  AROM ankle and toes, Toe F and E stretching, grastroc and soleus runner's stretch    Consulted and Agree with Plan of Care  Patient       Patient will benefit from skilled therapeutic intervention in order to improve the following deficits and impairments:  Abnormal gait, Decreased balance, Decreased endurance, Decreased range of motion, Decreased mobility, Decreased scar mobility, Decreased strength, Difficulty walking, Hypomobility, Increased edema, Increased fascial restricitons, Increased muscle spasms, Impaired perceived functional ability, Impaired flexibility, Improper body mechanics, Pain  Visit Diagnosis: Stiffness of left foot, not elsewhere classified  Other abnormalities of gait and mobility     Problem List Patient Active Problem List   Diagnosis Date Noted  . Well woman exam 02/20/2018  . 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 Alopecia 09/20/2013  . Menstrual cramps 11/11/2011  . History of miscarriage 02/04/2011  . ANEMIA-IRON DEFICIENCY 08/24/2010  . OTHER SPECIFIED ANEMIAS 08/24/2010  . Essential hypertension 08/24/2010  . ALLERGIC RHINITIS 08/24/2010    Ezekiel Ina, PT DPT 04/27/2018, 9:04 AM  [facility] MAIN [facility] SERVICES [address] [place], [place], [address] Phone: [phone]   Fax:  [phone]  Name: LILYMAE BRUNOT MRN: [id number] Date of Birth: 19-May-1976

## 2018-05-04 ENCOUNTER — Ambulatory Visit: Payer: No Typology Code available for payment source

## 2018-05-04 ENCOUNTER — Encounter: Payer: Self-pay | Admitting: Podiatry

## 2018-05-04 ENCOUNTER — Ambulatory Visit (INDEPENDENT_AMBULATORY_CARE_PROVIDER_SITE_OTHER): Payer: No Typology Code available for payment source

## 2018-05-04 ENCOUNTER — Ambulatory Visit (INDEPENDENT_AMBULATORY_CARE_PROVIDER_SITE_OTHER): Payer: No Typology Code available for payment source | Admitting: Podiatry

## 2018-05-04 DIAGNOSIS — M25675 Stiffness of left foot, not elsewhere classified: Secondary | ICD-10-CM

## 2018-05-04 DIAGNOSIS — M2012 Hallux valgus (acquired), left foot: Secondary | ICD-10-CM | POA: Diagnosis not present

## 2018-05-04 DIAGNOSIS — Z9889 Other specified postprocedural states: Secondary | ICD-10-CM

## 2018-05-04 DIAGNOSIS — R2689 Other abnormalities of gait and mobility: Secondary | ICD-10-CM

## 2018-05-04 NOTE — Progress Notes (Signed)
She presents today for follow-up of her [place] bunionectomy left foot.  Date of surgery Feb 17, 2018.  States that I only done physical therapy 3 times over 2 weeks states that I have still has some stiffness in the big toe joint but I have been in regular shoes since my last visit.  She states that it feels like my gait is starting to change a little bit.  Objective: Vital signs are stable she is alert and oriented x3.  Much decrease in edema there is no erythema cellulitis drainage or odor she has great plantar flexion dorsiflexion is limited and feels like it jams the dorsal medial aspect of the joint.  He also feels that the there is considerable scar tissue at this level.  Assessment well-healing surgical foot stiff dorsiflexion and limited to only about 15 to 20 degrees.  Plan: Encourage range of motion activities and exercises she will continue physical therapy I will follow-up with her when she is completed that.  I did briefly touch on possibility of having to get back into release the joint to release all the scar tissue she understands and is amenable to it if necessary.

## 2018-05-04 NOTE — Therapy (Signed)
[facility] MAIN [facility] SERVICES [address] [place], [place], [address] Phone: [phone]   Fax:  [phone]  Physical Therapy Treatment  Patient Details  Name: Jodi Ross MRN: [id number] Date of Birth: Jul 28, 1976 Referring Provider: Elinor Parkinson   Encounter Date: 05/04/2018  PT End of Session - 05/04/18 1443    Visit Number  4    Number of Visits  9    Date for PT Re-Evaluation  05/16/18    PT Start Time  1440    PT Stop Time  1520    PT Time Calculation (min)  40 min    Activity Tolerance  Patient tolerated treatment well    Behavior During Therapy  [facility] for tasks assessed/performed       Past Medical History:  Diagnosis Date  . Allergy   . Anemia, iron deficiency   . G6PD (glucose 6 phosphatase deficiency)   . G6PD deficiency (HCC)     History reviewed. No pertinent surgical history.  There were no vitals filed for this visit.  Subjective Assessment - 05/04/18 1440    Subjective  Pt states that she is doing well today. She has been more consistent with her HEP and denies pain upon arrival. She saw Dr. Al Corpus today who stated that her surgery is healing well but if she cannot get her full range back he may have to go back into her foot to surgically remove scar tissue.     Pertinent History  Pt is a 42 y/o F s/p L bunionectomy 02/17/18 (pin in 1st metatarsal). Pt says her big toe is stiff and pt is having trouble curling her toes.  Yesterday pt walked on the treadmill for the first time since surgery and says it took her longer to walk a mile (19 minutes) than before ( ) and feels somewhat off balance. Pt feels that she doesn't get the bend in her great toe like she should when walking.  Goals: to get back to treadmill at normal pace, elliptical, decrease stiffness.  Current pain: 0/10 Worst pain: 6/10.  Has only felt pain ~4x since surgery.  Pt denies having pain before the surgery but had to keep getting bigger shoes to  accommodate bunion.   Pt's first shift back at work was last night (works night shift).  Pt had been on light duty for 3 weeks prior to this.  Pt denies numbness or tingling.  Pt denies any prior surgery or injury to ankle, foot, toes except for possible self diagnosed plantar fasciitis (unsure if it was on this side) but took an anti-inflammatory and it went away. Pt in a CAM boot for 4 weeks following surgery, then wore post op shoe for 2 weeks.  Pt has been wearing normal shoes since 6/14.      Limitations  Walking    Diagnostic tests  see chart for imaging results    Patient Stated Goals  see above    Currently in Pain?  No/denies             TREATMENT    Manual Therapy  Intermetatarsal grade III joint mobs 5x30 seconds. Between 1st and 2nd, 2nd and 3rd, and 3rd and 4th metatarsals  Great toe AP and PA grade III joint mobs at both MTP and IP joints, 4 x 30 seconds each segment; Great toe MTP and IP distraction 30s x 3 both; Great toe MET flexion stretch 5s contract/5s hold x 5;  Assessment of talocrural joint  mobility which appears grossly symmetrical to R side with respect to dorsiflexion/plantarflexion in un weighted positions so deferred TC joint mobs on this date; Pt reports tightness during L great toe flexion to an area of scarring on the dorsal aspect over the first MTP joint;  Trigger Point Dry Needling (TDN), unbilled Education performed with patient regarding potential benefit of TDN. Reviewed precautions and risks with patient. Extensive time spent with pt to ensure full understanding of TDN risks. Pt provided verbal consent to treatment. TDN performed to L hallucis brevis near proximal attachment (away from surgical site) with two 0.25 x 30 single needle placements. No local twitch response (LTR) observed. Pistoning technique utilized. Assessed R great to flexion following without notable change. Likely not effective for future sessions.                        PT Education - 05/04/18 1443    Education Details  Plan of care    Person(s) Educated  Patient    Methods  Explanation    Comprehension  Verbalized understanding       PT Short Term Goals - 04/18/18 1149      PT SHORT TERM GOAL #1   Title  Pt will independently complete HEP at least 4 days/wk for improved carryover between sessions    Time  2    Period  Weeks    Status  New        PT Long Term Goals - 04/18/18 1150      PT LONG TERM GOAL #1   Title  Pt's L ankle AROM will improve to WNL for improved gait mechanics    Baseline   DF: 5 PF: 46 Eversion: 5 Inversion: 25    Time  4    Period  Weeks    Status  New      PT LONG TERM GOAL #2   Title  Pt's L great toe AROM will improve to WNL for improved gait mechanics    Baseline  Great toe MTP joint F: 10 E: 29 Great toe PIP joint F: 21 E: 10    Time  4    Period  Weeks    Status  New      PT LONG TERM GOAL #3   Title  Pt's effusion tape measurement will be equal BLE to demonstrate decreased swelling    Baseline   19.5 cm on the R 20.5 cm on the L       Time  3    Period  Weeks    Status  New      PT LONG TERM GOAL #4   Title  Pt will be able to ambulate 1 mile in equal to or less than 17 minutes for return to PLOF    Baseline  19 minutes    Time  4    Period  Weeks    Status  New            Plan - 05/04/18 1444    Clinical Impression Statement  Pt tolerates manual therapy with joint mobs and stretching well today without excessive pain. She reports improved consistency with her HEP since last therapy session. Attempted trigger point dry needling today to extensor hallucis brevis however no improved MTP flexion noted following. This is unlikely to be effective in the future as no trigger points were identified and no twitch response was noted. Pt encouraged to continue HEP and follow-up as  scheduled. She willl continue to benefit from skilled PT to improve strength and ROM of foot.     Rehab Potential  Good    PT Frequency  2x / week    PT Duration  4 weeks    PT Treatment/Interventions  ADLs/Self Care Home Management;Aquatic Therapy;Biofeedback;Cryotherapy;Electrical Stimulation;Iontophoresis 4mg /ml Dexamethasone;Moist Heat;Traction;Ultrasound;Parrafin;Contrast Bath;DME Instruction;Gait training;Stair training;Functional mobility training;Therapeutic activities;Therapeutic exercise;Balance training;Neuromuscular re-education;Patient/family education;Orthotic Fit/Training;Manual techniques;Compression bandaging;Scar mobilization;Passive range of motion;Dry needling;Energy conservation;Taping;Splinting    PT Next Visit Plan  have pt fill out LEFS, continue joint mobilizations and scar massage, assess L subtalar joint mobility and treat as needed, strength testing BLE    PT Home Exercise Plan  Ice, scar massage, AROM ankle and toes, Toe F and E stretching, grastroc and soleus runner's stretch    Consulted and Agree with Plan of Care  Patient       Patient will benefit from skilled therapeutic intervention in order to improve the following deficits and impairments:  Abnormal gait, Decreased balance, Decreased endurance, Decreased range of motion, Decreased mobility, Decreased scar mobility, Decreased strength, Difficulty walking, Hypomobility, Increased edema, Increased fascial restricitons, Increased muscle spasms, Impaired perceived functional ability, Impaired flexibility, Improper body mechanics, Pain  Visit Diagnosis: Stiffness of left foot, not elsewhere classified  Other abnormalities of gait and mobility     Problem List Patient Active Problem List   Diagnosis Date Noted  . Well woman exam 02/20/2018  . 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 Alopecia 09/20/2013  . Menstrual cramps 11/11/2011  . History of miscarriage 02/04/2011  . ANEMIA-IRON DEFICIENCY 08/24/2010  . OTHER SPECIFIED ANEMIAS 08/24/2010  . Essential hypertension 08/24/2010  . ALLERGIC RHINITIS  08/24/2010   Lynnea Maizes PT, DPT, GCS  Karlyn Glasco 05/04/2018, 5:03 PM  [facility] MAIN [facility] SERVICES [address] [place], [place], [address] Phone: [phone]   Fax:  [phone]  Name: Jodi Ross MRN: [id number] Date of Birth: 07-13-1976

## 2018-05-04 NOTE — Patient Instructions (Signed)

## 2018-05-09 ENCOUNTER — Encounter: Payer: Self-pay | Admitting: Physical Therapy

## 2018-05-09 ENCOUNTER — Ambulatory Visit: Payer: No Typology Code available for payment source | Admitting: Physical Therapy

## 2018-05-09 DIAGNOSIS — M25675 Stiffness of left foot, not elsewhere classified: Secondary | ICD-10-CM

## 2018-05-09 DIAGNOSIS — R2689 Other abnormalities of gait and mobility: Secondary | ICD-10-CM

## 2018-05-09 NOTE — Therapy (Signed)
[place] [facility] MAIN [facility] SERVICES [address] [place], [place], [address] Phone: [phone]   Fax:  [phone]  Physical Therapy Treatment  Patient Details  Name: Jodi Ross MRN: [id number] Date of Birth: Jun 24, 1976 Referring Provider: Elinor Parkinson   Encounter Date: 05/09/2018  PT End of Session - 05/09/18 0856    Visit Number  5    Number of Visits  9    Date for PT Re-Evaluation  05/16/18    PT Start Time  0805    PT Stop Time  0845    PT Time Calculation (min)  40 min    Activity Tolerance  Patient tolerated treatment well    Behavior During Therapy  [facility] for tasks assessed/performed       Past Medical History:  Diagnosis Date  . Allergy   . Anemia, iron deficiency   . G6PD (glucose 6 phosphatase deficiency)   . G6PD deficiency (HCC)     History reviewed. No pertinent surgical history.  There were no vitals filed for this visit.  Subjective Assessment - 05/09/18 0855    Subjective  Pt states that she is doing well today. She has been more consistent with her HEP and denies pain upon arrival. She saw Dr. Al Corpus today who stated that her surgery is healing well but if she cannot get her full range back he may have to go back into her foot to surgically remove scar tissue.     Pertinent History  Pt is a 42 y/o F s/p L bunionectomy 02/17/18 (pin in 1st metatarsal). Pt says her big toe is stiff and pt is having trouble curling her toes.  Yesterday pt walked on the treadmill for the first time since surgery and says it took her longer to walk a mile (19 minutes) than before ( ) and feels somewhat off balance. Pt feels that she doesn't get the bend in her great toe like she should when walking.  Goals: to get back to treadmill at normal pace, elliptical, decrease stiffness.  Current pain: 0/10 Worst pain: 6/10.  Has only felt pain ~4x since surgery.  Pt denies having pain before the surgery but had to keep getting bigger shoes to  accommodate bunion.   Pt's first shift back at work was last night (works night shift).  Pt had been on light duty for 3 weeks prior to this.  Pt denies numbness or tingling.  Pt denies any prior surgery or injury to ankle, foot, toes except for possible self diagnosed plantar fasciitis (unsure if it was on this side) but took an anti-inflammatory and it went away. Pt in a CAM boot for 4 weeks following surgery, then wore post op shoe for 2 weeks.  Pt has been wearing normal shoes since 6/14.      Limitations  Walking    Diagnostic tests  see chart for imaging results    Patient Stated Goals  see above    Currently in Pain?  No/denies    Multiple Pain Sites  No     Treatment:  Manual therapy  RLE: Scar massage, specifically to distal scar   Intermetatarsal grade III joint mobs 5x30 seconds. Between 1st and 2nd, 2nd and 3rd, and 3rd and 4th metatarsals   Grade IV L AP talocrural joint mobs 5x30 seconds   Great toe, 2nd toe, and 3rd toe AP and PA grade III joint mobs with distraction at MTP and PIP joints. 2x30 seconds each segment  PROM to right  great toe flex and ext and 2nd toe flex / ext x 10   Patient reports pain during stretching that decreases to 0/10 following the stretch.                     PT Education - 05/09/18 0855    Education Details  HEP    Person(s) Educated  Patient    Methods  Explanation;Demonstration;Verbal cues;Tactile cues    Comprehension  Verbalized understanding;Returned demonstration;Verbal cues required       PT Short Term Goals - 05/09/18 0857      PT SHORT TERM GOAL #1   Title  Pt will independently complete HEP at least 4 days/wk for improved carryover between sessions    Time  2    Period  Weeks    Status  Partially Met    Target Date  05/09/18        PT Long Term Goals - 04/18/18 1150      PT LONG TERM GOAL #1   Title  Pt's L ankle AROM will improve to WNL for improved gait mechanics    Baseline   DF: 5 PF: 46  Eversion: 5 Inversion: 25    Time  4    Period  Weeks    Status  New      PT LONG TERM GOAL #2   Title  Pt's L great toe AROM will improve to WNL for improved gait mechanics    Baseline  Great toe MTP joint F: 10 E: 29 Great toe PIP joint F: 21 E: 10    Time  4    Period  Weeks    Status  New      PT LONG TERM GOAL #3   Title  Pt's effusion tape measurement will be equal BLE to demonstrate decreased swelling    Baseline   19.5 cm on the R 20.5 cm on the L       Time  3    Period  Weeks    Status  New      PT LONG TERM GOAL #4   Title  Pt will be able to ambulate 1 mile in equal to or less than 17 minutes for return to PLOF    Baseline  19 minutes    Time  4    Period  Weeks    Status  New            Plan - 05/09/18 0857    Clinical Impression Statement  Patient performs PROM and therapeutic exercises in close chain positions without pain. She reports that she is doing her HEP and she is able to walk her normal walking distances but she is not able to lunge and she is not able to flex her great toe into full extension or flexion. She willl continue to benefit from skilled PT to improve strength and ROM of foot    Rehab Potential  Good    PT Frequency  2x / week    PT Duration  4 weeks    PT Treatment/Interventions  ADLs/Self Care Home Management;Aquatic Therapy;Biofeedback;Cryotherapy;Electrical Stimulation;Iontophoresis 4mg /ml Dexamethasone;Moist Heat;Traction;Ultrasound;Parrafin;Contrast Bath;DME Instruction;Gait training;Stair training;Functional mobility training;Therapeutic activities;Therapeutic exercise;Balance training;Neuromuscular re-education;Patient/family education;Orthotic Fit/Training;Manual techniques;Compression bandaging;Scar mobilization;Passive range of motion;Dry needling;Energy conservation;Taping;Splinting    PT Next Visit Plan  have pt fill out LEFS, continue joint mobilizations and scar massage, assess L subtalar joint mobility and treat as needed,  strength testing BLE    PT  Home Exercise Plan  Ice, scar massage, AROM ankle and toes, Toe F and E stretching, grastroc and soleus runner's stretch    Consulted and Agree with Plan of Care  Patient       Patient will benefit from skilled therapeutic intervention in order to improve the following deficits and impairments:  Abnormal gait, Decreased balance, Decreased endurance, Decreased range of motion, Decreased mobility, Decreased scar mobility, Decreased strength, Difficulty walking, Hypomobility, Increased edema, Increased fascial restricitons, Increased muscle spasms, Impaired perceived functional ability, Impaired flexibility, Improper body mechanics, Pain  Visit Diagnosis: Stiffness of left foot, not elsewhere classified  Other abnormalities of gait and mobility     Problem List Patient Active Problem List   Diagnosis Date Noted  . Well woman exam 02/20/2018  . 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 Alopecia 09/20/2013  . Menstrual cramps 11/11/2011  . History of miscarriage 02/04/2011  . ANEMIA-IRON DEFICIENCY 08/24/2010  . OTHER SPECIFIED ANEMIAS 08/24/2010  . Essential hypertension 08/24/2010  . ALLERGIC RHINITIS 08/24/2010    Ezekiel Ina, PT DPT 05/09/2018, 8:58 AM  [facility] MAIN [facility] SERVICES [address] [place], [place], [address] Phone: [phone]   Fax:  [phone]  Name: Jodi Ross MRN: [id number] Date of Birth: 04-Sep-1976

## 2018-05-16 ENCOUNTER — Ambulatory Visit: Payer: No Typology Code available for payment source | Admitting: Physical Therapy

## 2018-05-16 ENCOUNTER — Encounter: Payer: Self-pay | Admitting: Physical Therapy

## 2018-05-16 DIAGNOSIS — M25675 Stiffness of left foot, not elsewhere classified: Secondary | ICD-10-CM

## 2018-05-16 DIAGNOSIS — R2689 Other abnormalities of gait and mobility: Secondary | ICD-10-CM

## 2018-05-16 NOTE — Therapy (Signed)
[facility] MAIN [facility] SERVICES [address] [place], [place], [address] Phone: [phone]   Fax:  [phone]  Physical Therapy Treatment/ Discharge Summary  Patient Details  Name: Jodi Ross MRN: [id number] Date of Birth: 09-18-1976 Referring Provider: Elinor Parkinson   Encounter Date: 05/16/2018  PT End of Session - 05/16/18 1429    Visit Number  6    Number of Visits  9    Date for PT Re-Evaluation  05/16/18    PT Start Time  0230    PT Stop Time  0315    PT Time Calculation (min)  45 min    Activity Tolerance  Patient tolerated treatment well    Behavior During Therapy  [facility] for tasks assessed/performed       Past Medical History:  Diagnosis Date  . Allergy   . Anemia, iron deficiency   . G6PD (glucose 6 phosphatase deficiency)   . G6PD deficiency (HCC)     History reviewed. No pertinent surgical history.  There were no vitals filed for this visit.  Subjective Assessment - 05/16/18 0856    Subjective  Pt states that she is doing well today. She has been consistent with her HEP and denies pain upon arrival.    Pertinent History  Pt is a 42 y/o F s/p L bunionectomy 02/17/18 (pin in 1st metatarsal). Pt says her big toe is stiff and pt is having trouble curling her toes.  Yesterday pt walked on the treadmill for the first time since surgery and says it took her longer to walk a mile (19 minutes) than before ( ) and feels somewhat off balance. Pt feels that she doesn't get the bend in her great toe like she should when walking.  Goals: to get back to treadmill at normal pace, elliptical, decrease stiffness.  Current pain: 0/10 Worst pain: 6/10.  Has only felt pain ~4x since surgery.  Pt denies having pain before the surgery but had to keep getting bigger shoes to accommodate bunion.   Pt's first shift back at work was last night (works night shift).  Pt had been on light duty for 3 weeks prior to this.  Pt denies numbness or  tingling.  Pt denies any prior surgery or injury to ankle, foot, toes except for possible self diagnosed plantar fasciitis (unsure if it was on this side) but took an anti-inflammatory and it went away. Pt in a CAM boot for 4 weeks following surgery, then wore post op shoe for 2 weeks.  Pt has been wearing normal shoes since 6/14.      Limitations  Walking    Diagnostic tests  see chart for imaging results    Patient Stated Goals  see above    Currently in Pain?  No/denies    Multiple Pain Sites  No       Treatment: Manual therapy  Intermetatarsal grade III joint mobs 5x30 seconds. Between 1st and 2nd, 2nd and 3rd, and 3rd and 4th metatarsals   Great toe, 2nd toe, and 3rd toe AP and PA grade III joint mobs with distraction at MTP and PIP joints. 2x30 seconds each segment   PROM to right  great toe flex and ext and 2nd toe flex / ext x 10   Patient reports pain during stretching that decreases to 0/10 following the stretch  Goals were reviewed and not enough progress is being made for justification to continue with therapy and DC is recommended.  PT Education - 05/16/18 1429    Education Details  HEP    Person(s) Educated  Patient    Methods  Explanation    Comprehension  Verbalized understanding       PT Short Term Goals - 05/09/18 0857      PT SHORT TERM GOAL #1   Title  Pt will independently complete HEP at least 4 days/wk for improved carryover between sessions    Time  2    Period  Weeks    Status  Partially Met    Target Date  05/09/18        PT Long Term Goals - 05/16/18 1439      PT LONG TERM GOAL #1   Title  Pt's L ankle AROM will improve to WNL for improved gait mechanics    Baseline   DF: 5 PF: 46 Eversion: 5 Inversion: 25:  05/16/18 DF 10, Eversion 50 , inversion 20 left ankle    Time  4    Period  Weeks    Status  Achieved    Target Date  05/16/18      PT LONG TERM GOAL #2   Title  Pt's L great toe AROM will  improve to WNL for improved gait mechanics    Baseline  Great toe MTP joint F: 10 E: 29 Great toe PIP joint F: 21 E: 10 05/16/18 same as eval     Time  4    Period  Weeks    Status  Not Met    Target Date  05/16/18      PT LONG TERM GOAL #3   Title  Pt's effusion tape measurement will be equal BLE to demonstrate decreased swelling    Baseline   19.5 cm on the R 20.5 cm on the L   : 05/16/18 19.50 cm    Time  3    Period  Weeks    Status  Achieved      PT LONG TERM GOAL #4   Title  Pt will be able to ambulate 1 mile in equal to or less than 17 minutes for return to PLOF    Baseline  19 minutes: 05/16/18 17 minutes    Time  4    Period  Weeks    Status  Not Met    Target Date  05/16/18            Plan - 05/17/18 0857    Clinical Impression Statement  Patient tolerates PROM to left great toe and AAROM to left great toe. She has not been able to gain significant ROM to left great toe extension or flexion and the joint has a hard end feel . She is walking a mile in about the same amount of time as evaluation. She achieved goal #1 for ankle DF and goal # 3 for decreasing swelling but goal # 2 and ,#4 have not been met and physical therapy is not able to  progress towards increasing her great toe ROM.; therefore the recommendation is to discharge from physical therapy .     Rehab Potential  Good    PT Frequency  2x / week    PT Duration  4 weeks    PT Treatment/Interventions  ADLs/Self Care Home Management;Aquatic Therapy;Biofeedback;Cryotherapy;Electrical Stimulation;Iontophoresis 4mg /ml Dexamethasone;Moist Heat;Traction;Ultrasound;Parrafin;Contrast Bath;DME Instruction;Gait training;Stair training;Functional mobility training;Therapeutic activities;Therapeutic exercise;Balance training;Neuromuscular re-education;Patient/family education;Orthotic Fit/Training;Manual techniques;Compression bandaging;Scar mobilization;Passive range of motion;Dry needling;Energy conservation;Taping;Splinting     PT Next Visit Plan  have pt fill  out LEFS, continue joint mobilizations and scar massage, assess L subtalar joint mobility and treat as needed, strength testing BLE    PT Home Exercise Plan  Ice, scar massage, AROM ankle and toes, Toe F and E stretching, grastroc and soleus runner's stretch    Consulted and Agree with Plan of Care  Patient       Patient will benefit from skilled therapeutic intervention in order to improve the following deficits and impairments:  Abnormal gait, Decreased balance, Decreased endurance, Decreased range of motion, Decreased mobility, Decreased scar mobility, Decreased strength, Difficulty walking, Hypomobility, Increased edema, Increased fascial restricitons, Increased muscle spasms, Impaired perceived functional ability, Impaired flexibility, Improper body mechanics, Pain  Visit Diagnosis: Stiffness of left foot, not elsewhere classified  Other abnormalities of gait and mobility     Problem List Patient Active Problem List   Diagnosis Date Noted  . Well woman exam 02/20/2018  . 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 Alopecia 09/20/2013  . Menstrual cramps 11/11/2011  . History of miscarriage 02/04/2011  . ANEMIA-IRON DEFICIENCY 08/24/2010  . OTHER SPECIFIED ANEMIAS 08/24/2010  . Essential hypertension 08/24/2010  . ALLERGIC RHINITIS 08/24/2010    Ezekiel Ina, PT DPT 05/17/2018, 9:03 AM  [facility] MAIN [facility] SERVICES [address] [place], [place], [address] Phone: [phone]   Fax:  [phone]  Name: EMALINE STRIANO MRN: [id number] Date of Birth: 12/06/1975

## 2018-05-17 ENCOUNTER — Telehealth: Payer: Self-pay | Admitting: Podiatry

## 2018-05-17 ENCOUNTER — Other Ambulatory Visit: Payer: Self-pay

## 2018-05-17 DIAGNOSIS — M2012 Hallux valgus (acquired), left foot: Secondary | ICD-10-CM

## 2018-05-17 NOTE — Telephone Encounter (Signed)
I returned patient call, informed her via voice mail to go ahead with the physical therapy appt and keep her appt with Dr. Al CorpusHyatt on 06/05/18

## 2018-05-17 NOTE — Telephone Encounter (Signed)
I'm a pt of Dr. Geryl RankinsHyatt's and I've been going to physical therapy. My last session was Monday and Dr. Al CorpusHyatt wanted me to make a follow up appointment when I was finished. However, I have another physical therapy appointment tomorrow and I would like to know if he would like me to go ahead and make the appointment or do another physical therapy session? So please call me back at [phone]. Thank you.

## 2018-05-18 ENCOUNTER — Ambulatory Visit: Payer: No Typology Code available for payment source | Attending: Podiatry

## 2018-05-18 DIAGNOSIS — M25675 Stiffness of left foot, not elsewhere classified: Secondary | ICD-10-CM | POA: Diagnosis not present

## 2018-05-18 DIAGNOSIS — R2689 Other abnormalities of gait and mobility: Secondary | ICD-10-CM | POA: Insufficient documentation

## 2018-05-18 NOTE — Therapy (Signed)
[place] MAIN [facility] SERVICES [address] [place], [place], [address] Phone: [phone]   Fax:  [phone]  Physical Therapy Evaluation/Re-Eval  Patient Details  Name: Jodi Ross MRN: [id number] Date of Birth: 1976/04/21 Referring Provider: Garrel Ridgel   Encounter Date: 05/18/2018  PT End of Session - 05/19/18 0836    Visit Number  1    Number of Visits  4    Date for PT Re-Evaluation  06/15/18    PT Start Time  3832    PT Stop Time  1559    PT Time Calculation (min)  45 min    Activity Tolerance  Patient tolerated treatment well    Behavior During Therapy  [facility] for tasks assessed/performed       Past Medical History:  Diagnosis Date  . Allergy   . Anemia, iron deficiency   . G6PD (glucose 6 phosphatase deficiency)   . G6PD deficiency ([place])     History reviewed. No pertinent surgical history.  There were no vitals filed for this visit.    mile previous: 16/17 now 19 minutes [facility] PT Assessment - 05/19/18 0001      Assessment   Medical Diagnosis  S/p L bunionectomy 02/17/18    Referring Provider  max T Hyatt    Onset Date/Surgical Date  02/17/18      Precautions   Precautions  None      Restrictions   Weight Bearing Restrictions  No      Balance Screen   Has the patient fallen in the past 6 months  No      Prior Function   Level of Independence  Independent    Vocation  Full time employment    Vocation Requirements  walking, lifting, standing, sitting, typing    Leisure  walking on the treadmill during work breaks      Cognition   Overall Cognitive Status  Within Functional Limits for tasks assessed      Strength   Overall Strength  Deficits    Strength Assessment Site  Hip;Knee;Ankle    Right/Left Hip  Right;Left    Right Hip Flexion  5/5    Right Hip Extension  3+/5    Right Hip External Rotation   5/5    Right Hip Internal Rotation  5/5    Right Hip ABduction  5/5    Right Hip ADduction  5/5    Left Hip Flexion  5/5    Left Hip Extension  3+/5    Left Hip External Rotation  5/5    Left Hip Internal Rotation  4+/5    Left Hip ABduction  5/5    Left Hip ADduction  5/5    Right/Left Knee  Right;Left    Right Knee Flexion  5/5    Right Knee Extension  5/5    Left Knee Flexion  5/5    Left Knee Extension  5/5    Right/Left Ankle  Right;Left    Right Ankle Dorsiflexion  5/5    Right Ankle Plantar Flexion  5/5    Right Ankle Inversion  5/5    Right Ankle Eversion  5/5    Left Ankle Dorsiflexion  5/5    Left Ankle Plantar Flexion  5/5    Left Ankle Inversion  5/5      Only get pain in flexion. No pain with ambulation, only slower and unsteady.    Palpation: scar adhesions distally on scar (initiating at metatarsal  [place] MAIN [facility] SERVICES [address] [place], [place], [address] Phone: [phone]   Fax:  [phone]  Physical Therapy Evaluation/Re-Eval  Patient Details  Name: Jodi Ross MRN: [id number] Date of Birth: 1976/04/21 Referring Provider: Garrel Ridgel   Encounter Date: 05/18/2018  PT End of Session - 05/19/18 0836    Visit Number  1    Number of Visits  4    Date for PT Re-Evaluation  06/15/18    PT Start Time  3832    PT Stop Time  1559    PT Time Calculation (min)  45 min    Activity Tolerance  Patient tolerated treatment well    Behavior During Therapy  [facility] for tasks assessed/performed       Past Medical History:  Diagnosis Date  . Allergy   . Anemia, iron deficiency   . G6PD (glucose 6 phosphatase deficiency)   . G6PD deficiency ([place])     History reviewed. No pertinent surgical history.  There were no vitals filed for this visit.    mile previous: 16/17 now 19 minutes [facility] PT Assessment - 05/19/18 0001      Assessment   Medical Diagnosis  S/p L bunionectomy 02/17/18    Referring Provider  max T Hyatt    Onset Date/Surgical Date  02/17/18      Precautions   Precautions  None      Restrictions   Weight Bearing Restrictions  No      Balance Screen   Has the patient fallen in the past 6 months  No      Prior Function   Level of Independence  Independent    Vocation  Full time employment    Vocation Requirements  walking, lifting, standing, sitting, typing    Leisure  walking on the treadmill during work breaks      Cognition   Overall Cognitive Status  Within Functional Limits for tasks assessed      Strength   Overall Strength  Deficits    Strength Assessment Site  Hip;Knee;Ankle    Right/Left Hip  Right;Left    Right Hip Flexion  5/5    Right Hip Extension  3+/5    Right Hip External Rotation   5/5    Right Hip Internal Rotation  5/5    Right Hip ABduction  5/5    Right Hip ADduction  5/5    Left Hip Flexion  5/5    Left Hip Extension  3+/5    Left Hip External Rotation  5/5    Left Hip Internal Rotation  4+/5    Left Hip ABduction  5/5    Left Hip ADduction  5/5    Right/Left Knee  Right;Left    Right Knee Flexion  5/5    Right Knee Extension  5/5    Left Knee Flexion  5/5    Left Knee Extension  5/5    Right/Left Ankle  Right;Left    Right Ankle Dorsiflexion  5/5    Right Ankle Plantar Flexion  5/5    Right Ankle Inversion  5/5    Right Ankle Eversion  5/5    Left Ankle Dorsiflexion  5/5    Left Ankle Plantar Flexion  5/5    Left Ankle Inversion  5/5      Only get pain in flexion. No pain with ambulation, only slower and unsteady.    Palpation: scar adhesions distally on scar (initiating at metatarsal  for return to PLOF    Baseline  19 minutes: 05/16/18 17 minutes 8/2: 19 minutes     Time  4    Period  Weeks    Status  On-going    Target Date  06/15/18             Plan - 05/19/18 0840    Clinical Impression Statement  Patient returning to physical therapy after conversing with doctor. Doctor requests patient return to PT for 1x/week until he sees her. ROM has not altered significantly in first toe (big toe). Hypomobility of first and second MTP and PIP joints noted with  plantar tissue muscle length limited. Scar adhesions distally noted with patient educated on need to continue scar massage. Marble pick ups and towel scrunches added to HEP to improve AROM flexion and extension as well as increase small musculature strength. 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, no pain (-) little time for rest, bunion RLE, limited ROM increase     Clinical Presentation  Stable    Clinical Presentation due to:  bunionectomy without impairments,     Rehab Potential  Good    PT Frequency  1x / week    PT Duration  4 weeks    PT Treatment/Interventions  ADLs/Self Care Home Management;Aquatic Therapy;Biofeedback;Cryotherapy;Electrical Stimulation;Iontophoresis 12m/ml Dexamethasone;Moist Heat;Traction;Ultrasound;Parrafin;Contrast Bath;DME Instruction;Gait training;Stair training;Functional mobility training;Therapeutic activities;Therapeutic exercise;Balance training;Neuromuscular re-education;Patient/family education;Orthotic Fit/Training;Manual techniques;Compression bandaging;Scar mobilization;Passive range of motion;Dry needling;Energy conservation;Taping;Splinting    PT Next Visit Plan  continue joint mobilizations and scar massage,     PT Home Exercise Plan  Ice, scar massage, AROM ankle and toes, Toe F and E stretching, grastroc and soleus runner's stretch, marble pickup, towel scrunch    Consulted and Agree with Plan of Care  Patient       Patient will benefit from skilled therapeutic intervention in order to improve the following deficits and impairments:  Abnormal gait, Decreased balance, Decreased endurance, Decreased range of motion, Decreased mobility, Decreased scar mobility, Decreased strength, Difficulty walking, Hypomobility, Increased edema, Increased fascial restricitons, Increased muscle spasms, Impaired  perceived functional ability, Impaired flexibility, Improper body mechanics, Pain  Visit Diagnosis: Stiffness of left foot, not elsewhere classified  Other abnormalities of gait and mobility     Problem List Patient Active Problem List   Diagnosis Date Noted  . Well woman exam 02/20/2018  . 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 Alopecia 09/20/2013  . Menstrual cramps 11/11/2011  . History of miscarriage 02/04/2011  . ANEMIA-IRON DEFICIENCY 08/24/2010  . OTHER SPECIFIED ANEMIAS 08/24/2010  . Essential hypertension 08/24/2010  . ALLERGIC RHINITIS 08/24/2010   MJanna Arch PT, DPT   05/19/2018, 8:49 AM  [place] [facility] [address] [address]Phone: [phone]  Fax:  [phone] Name: MTENIYA FILTERMRN: [id number] of Birth: [birth year]

## 2018-05-22 ENCOUNTER — Ambulatory Visit: Payer: No Typology Code available for payment source

## 2018-05-22 DIAGNOSIS — M25675 Stiffness of left foot, not elsewhere classified: Secondary | ICD-10-CM | POA: Diagnosis not present

## 2018-05-22 DIAGNOSIS — R2689 Other abnormalities of gait and mobility: Secondary | ICD-10-CM

## 2018-05-22 NOTE — Therapy (Signed)
[place] MAIN [facility] SERVICES [address] [place], [place], [address] Phone: [phone]   Fax:  [phone]  Physical Therapy Treatment  Patient Details  Name: Jodi Ross MRN: [id number] Date of Birth: 14-Jan-1976 Referring Provider: Garrel Ridgel   Encounter Date: 05/22/2018  PT End of Session - 05/22/18 1326    Visit Number  2    Number of Visits  4    Date for PT Re-Evaluation  06/15/18    PT Start Time  0845    PT Stop Time  0929    PT Time Calculation (min)  44 min    Activity Tolerance  Patient tolerated treatment well    Behavior During Therapy  [facility] for tasks assessed/performed       Past Medical History:  Diagnosis Date  . Allergy   . Anemia, iron deficiency   . G6PD (glucose 6 phosphatase deficiency)   . G6PD deficiency ([place])     History reviewed. No pertinent surgical history.  There were no vitals filed for this visit.  Subjective Assessment - 05/22/18 0851    Subjective  Patient reports compliance with HEP, No pain unless bending foot, Reports it feels awkward.     Pertinent History  Pt is a 42 y/o F s/p L bunionectomy 02/17/18 (pin in 1st metatarsal). Pt says her big toe is stiff and pt is having trouble curling her toes.  Yesterday pt walked on the treadmill for the first time since surgery and says it took her longer to walk a mile (19 minutes) than before (26mnutes) and feels somewhat off balance. Pt feels that she doesn't get the bend in her great toe like she should when walking.  Goals: to get back to treadmill at normal pace, elliptical, decrease stiffness.  Current pain: 0/10 Worst pain: 6/10.  Has only felt pain ~4x since surgery.  Pt denies having pain before the surgery but had to keep getting bigger shoes to accommodate bunion.   Pt's first shift back at work was last night (works night shift).  Pt had been on light duty for 3 weeks prior to this.  Pt denies numbness or tingling.  Pt denies any prior surgery or  injury to ankle, foot, toes except for possible self diagnosed plantar fasciitis (unsure if it was on this side) but took an anti-inflammatory and it went away. Pt in a CAM boot for 4 weeks following surgery, then wore post op shoe for 2 weeks.  Pt has been wearing normal shoes since 6/14.      Limitations  Walking;Other (comment) bending big toe     How long can you sit comfortably?  n/a    How long can you stand comfortably?  n/a    How long can you walk comfortably?  uphill uncomfortable.     Diagnostic tests  see chart for imaging results    Patient Stated Goals  see above    Currently in Pain?  No/denies         Manual Therapy  Intermetatarsal grade III joint mobs 5x30 seconds. Between 1st and 2nd, 2nd and 3rd, and 3rd and 4th metatarsals  Great toe AP and PA grade III joint mobs at both MTP and IP joints, 4 x 30 seconds each segment; Great toe MTP and IP distraction 30s x 3 both; Great toe MET flexion stretch 5s contract/5s hold x 5;  Assessment of talocrural joint mobility which appears grossly symmetrical to R side with respect to  [place] MAIN [facility] SERVICES [address] [place], [place], [address] Phone: [phone]   Fax:  [phone]  Physical Therapy Treatment  Patient Details  Name: Jodi Ross MRN: [id number] Date of Birth: 14-Jan-1976 Referring Provider: Garrel Ridgel   Encounter Date: 05/22/2018  PT End of Session - 05/22/18 1326    Visit Number  2    Number of Visits  4    Date for PT Re-Evaluation  06/15/18    PT Start Time  0845    PT Stop Time  0929    PT Time Calculation (min)  44 min    Activity Tolerance  Patient tolerated treatment well    Behavior During Therapy  [facility] for tasks assessed/performed       Past Medical History:  Diagnosis Date  . Allergy   . Anemia, iron deficiency   . G6PD (glucose 6 phosphatase deficiency)   . G6PD deficiency ([place])     History reviewed. No pertinent surgical history.  There were no vitals filed for this visit.  Subjective Assessment - 05/22/18 0851    Subjective  Patient reports compliance with HEP, No pain unless bending foot, Reports it feels awkward.     Pertinent History  Pt is a 42 y/o F s/p L bunionectomy 02/17/18 (pin in 1st metatarsal). Pt says her big toe is stiff and pt is having trouble curling her toes.  Yesterday pt walked on the treadmill for the first time since surgery and says it took her longer to walk a mile (19 minutes) than before (26mnutes) and feels somewhat off balance. Pt feels that she doesn't get the bend in her great toe like she should when walking.  Goals: to get back to treadmill at normal pace, elliptical, decrease stiffness.  Current pain: 0/10 Worst pain: 6/10.  Has only felt pain ~4x since surgery.  Pt denies having pain before the surgery but had to keep getting bigger shoes to accommodate bunion.   Pt's first shift back at work was last night (works night shift).  Pt had been on light duty for 3 weeks prior to this.  Pt denies numbness or tingling.  Pt denies any prior surgery or  injury to ankle, foot, toes except for possible self diagnosed plantar fasciitis (unsure if it was on this side) but took an anti-inflammatory and it went away. Pt in a CAM boot for 4 weeks following surgery, then wore post op shoe for 2 weeks.  Pt has been wearing normal shoes since 6/14.      Limitations  Walking;Other (comment) bending big toe     How long can you sit comfortably?  n/a    How long can you stand comfortably?  n/a    How long can you walk comfortably?  uphill uncomfortable.     Diagnostic tests  see chart for imaging results    Patient Stated Goals  see above    Currently in Pain?  No/denies         Manual Therapy  Intermetatarsal grade III joint mobs 5x30 seconds. Between 1st and 2nd, 2nd and 3rd, and 3rd and 4th metatarsals  Great toe AP and PA grade III joint mobs at both MTP and IP joints, 4 x 30 seconds each segment; Great toe MTP and IP distraction 30s x 3 both; Great toe MET flexion stretch 5s contract/5s hold x 5;  Assessment of talocrural joint mobility which appears grossly symmetrical to R side with respect to  requiring cueing for widening BOS and weight shifting over LLE for improved progression of limb. She willl continue to benefit from skilled PT to improve strength and ROM of foot     Rehab Potential  Good    PT Frequency   1x / week    PT Duration  4 weeks    PT Treatment/Interventions  ADLs/Self Care Home Management;Aquatic Therapy;Biofeedback;Cryotherapy;Electrical Stimulation;Iontophoresis 74m/ml Dexamethasone;Moist Heat;Traction;Ultrasound;Parrafin;Contrast Bath;DME Instruction;Gait training;Stair training;Functional mobility training;Therapeutic activities;Therapeutic exercise;Balance training;Neuromuscular re-education;Patient/family education;Orthotic Fit/Training;Manual techniques;Compression bandaging;Scar mobilization;Passive range of motion;Dry needling;Energy conservation;Taping;Splinting    PT Next Visit Plan  continue joint mobilizations and scar massage,     PT Home Exercise Plan  Ice, scar massage, AROM ankle and toes, Toe F and E stretching, grastroc and soleus runner's stretch, marble pickup, towel scrunch    Consulted and Agree with Plan of Care  Patient       Patient will benefit from skilled therapeutic intervention in order to improve the following deficits and impairments:  Abnormal gait, Decreased balance, Decreased endurance, Decreased range of motion, Decreased mobility, Decreased scar mobility, Decreased strength, Difficulty walking, Hypomobility, Increased edema, Increased fascial restricitons, Increased muscle spasms, Impaired perceived functional ability, Impaired flexibility, Improper body mechanics, Pain  Visit Diagnosis: Stiffness of left foot, not elsewhere classified  Other abnormalities of gait and mobility     Problem List Patient Active Problem List   Diagnosis Date Noted  . Well woman exam 02/20/2018  . 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 Alopecia 09/20/2013  . Menstrual cramps 11/11/2011  . History of miscarriage 02/04/2011  . ANEMIA-IRON DEFICIENCY 08/24/2010  . OTHER SPECIFIED ANEMIAS 08/24/2010  . Essential hypertension 08/24/2010  . ALLERGIC RHINITIS 08/24/2010   MJanna Arch PT, DPT   05/22/2018, 1:29 PM  [place]  [facility] [address] [place] [address]Phone: [phone]  Fax:  [phone] Name: Jodi PLUNKMRN: [id number] of Birth: [birth year]

## 2018-05-31 ENCOUNTER — Ambulatory Visit: Payer: No Typology Code available for payment source

## 2018-05-31 DIAGNOSIS — R2689 Other abnormalities of gait and mobility: Secondary | ICD-10-CM

## 2018-05-31 DIAGNOSIS — M25675 Stiffness of left foot, not elsewhere classified: Secondary | ICD-10-CM

## 2018-05-31 NOTE — Therapy (Signed)
such as when wearing wedges.  Patient has tenderness of plantar foot, especially to flexor hallucis tendon to STM with multiple adhesions throughout tendon noted. Patient has improved flexion and extension secondary to adhesion STM. She willl continue to benefit from skilled  PT to improve strength and ROM of foot     Rehab Potential  Good    PT Frequency  1x / week    PT Duration  4 weeks    PT Treatment/Interventions  ADLs/Self Care Home Management;Aquatic Therapy;Biofeedback;Cryotherapy;Electrical Stimulation;Iontophoresis '4mg'$ /ml Dexamethasone;Moist Heat;Traction;Ultrasound;Parrafin;Contrast Bath;DME Instruction;Gait training;Stair training;Functional mobility training;Therapeutic activities;Therapeutic exercise;Balance training;Neuromuscular re-education;Patient/family education;Orthotic Fit/Training;Manual techniques;Compression bandaging;Scar mobilization;Passive range of motion;Dry needling;Energy conservation;Taping;Splinting    PT Next Visit Plan  continue joint mobilizations and scar massage,     PT Home Exercise Plan  Ice, scar massage, AROM ankle and toes, Toe F and E stretching, grastroc and soleus runner's stretch, marble pickup, towel scrunch    Consulted and Agree with Plan of Care  Patient       Patient will benefit from skilled therapeutic intervention in order to improve the following deficits and impairments:  Abnormal gait, Decreased balance, Decreased endurance, Decreased range of motion, Decreased mobility, Decreased scar mobility, Decreased strength, Difficulty walking, Hypomobility, Increased edema, Increased fascial restricitons, Increased muscle spasms, Impaired perceived functional ability, Impaired flexibility, Improper body mechanics, Pain  Visit Diagnosis: Stiffness of left foot, not elsewhere classified  Other abnormalities of gait and mobility     Problem List Patient Active Problem List   Diagnosis Date Noted  . Well woman exam 02/20/2018  . 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 Alopecia 09/20/2013  . Menstrual cramps 11/11/2011  . History of miscarriage 02/04/2011  . ANEMIA-IRON DEFICIENCY 08/24/2010  . OTHER SPECIFIED ANEMIAS 08/24/2010  . Essential hypertension 08/24/2010  . ALLERGIC RHINITIS 08/24/2010   Janna Arch, PT,  DPT   05/31/2018, 2:55 PM  [place] MAIN [facility] SERVICES [address] [place], [place], [address] Phone: [phone]   Fax:  [phone]  Name: Jodi Ross MRN: [id number] Date of Birth: 1976-03-15  such as when wearing wedges.  Patient has tenderness of plantar foot, especially to flexor hallucis tendon to STM with multiple adhesions throughout tendon noted. Patient has improved flexion and extension secondary to adhesion STM. She willl continue to benefit from skilled  PT to improve strength and ROM of foot     Rehab Potential  Good    PT Frequency  1x / week    PT Duration  4 weeks    PT Treatment/Interventions  ADLs/Self Care Home Management;Aquatic Therapy;Biofeedback;Cryotherapy;Electrical Stimulation;Iontophoresis '4mg'$ /ml Dexamethasone;Moist Heat;Traction;Ultrasound;Parrafin;Contrast Bath;DME Instruction;Gait training;Stair training;Functional mobility training;Therapeutic activities;Therapeutic exercise;Balance training;Neuromuscular re-education;Patient/family education;Orthotic Fit/Training;Manual techniques;Compression bandaging;Scar mobilization;Passive range of motion;Dry needling;Energy conservation;Taping;Splinting    PT Next Visit Plan  continue joint mobilizations and scar massage,     PT Home Exercise Plan  Ice, scar massage, AROM ankle and toes, Toe F and E stretching, grastroc and soleus runner's stretch, marble pickup, towel scrunch    Consulted and Agree with Plan of Care  Patient       Patient will benefit from skilled therapeutic intervention in order to improve the following deficits and impairments:  Abnormal gait, Decreased balance, Decreased endurance, Decreased range of motion, Decreased mobility, Decreased scar mobility, Decreased strength, Difficulty walking, Hypomobility, Increased edema, Increased fascial restricitons, Increased muscle spasms, Impaired perceived functional ability, Impaired flexibility, Improper body mechanics, Pain  Visit Diagnosis: Stiffness of left foot, not elsewhere classified  Other abnormalities of gait and mobility     Problem List Patient Active Problem List   Diagnosis Date Noted  . Well woman exam 02/20/2018  . 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 Alopecia 09/20/2013  . Menstrual cramps 11/11/2011  . History of miscarriage 02/04/2011  . ANEMIA-IRON DEFICIENCY 08/24/2010  . OTHER SPECIFIED ANEMIAS 08/24/2010  . Essential hypertension 08/24/2010  . ALLERGIC RHINITIS 08/24/2010   Janna Arch, PT,  DPT   05/31/2018, 2:55 PM  [place] MAIN [facility] SERVICES [address] [place], [place], [address] Phone: [phone]   Fax:  [phone]  Name: Jodi Ross MRN: [id number] Date of Birth: 1976-03-15  [place] MAIN [facility] SERVICES [address] [place], [place], [address] Phone: [phone]   Fax:  [phone]  Physical Therapy Treatment  Patient Details  Name: SONOMA FIRKUS MRN: [id number] Date of Birth: 09-08-1976 Referring Provider: Garrel Ridgel   Encounter Date: 05/31/2018  PT End of Session - 05/31/18 1451    Visit Number  3    Number of Visits  4    Date for PT Re-Evaluation  06/15/18    PT Start Time  1005    PT Stop Time  1051    PT Time Calculation (min)  46 min    Activity Tolerance  Patient tolerated treatment well    Behavior During Therapy  [facility] for tasks assessed/performed       Past Medical History:  Diagnosis Date  . Allergy   . Anemia, iron deficiency   . G6PD (glucose 6 phosphatase deficiency)   . G6PD deficiency ([place])     History reviewed. No pertinent surgical history.  There were no vitals filed for this visit.  Subjective Assessment - 05/31/18 1010    Subjective  Patient got mile in 17.50 minutes. Reports second and third toe when wearing wedges. Work has been busy. Reports she will see doctor Monday,     Pertinent History  Pt is a 42 y/o F s/p L bunionectomy 02/17/18 (pin in 1st metatarsal). Pt says her big toe is stiff and pt is having trouble curling her toes.  Yesterday pt walked on the treadmill for the first time since surgery and says it took her longer to walk a mile (19 minutes) than before (45mnutes) and feels somewhat off balance. Pt feels that she doesn't get the bend in her great toe like she should when walking.  Goals: to get back to treadmill at normal pace, elliptical, decrease stiffness.  Current pain: 0/10 Worst pain: 6/10.  Has only felt pain ~4x since surgery.  Pt denies having pain before the surgery but had to keep getting bigger shoes to accommodate bunion.   Pt's first shift back at work was last night (works night shift).  Pt had been on light duty for 3 weeks prior to this.  Pt denies  numbness or tingling.  Pt denies any prior surgery or injury to ankle, foot, toes except for possible self diagnosed plantar fasciitis (unsure if it was on this side) but took an anti-inflammatory and it went away. Pt in a CAM boot for 4 weeks following surgery, then wore post op shoe for 2 weeks.  Pt has been wearing normal shoes since 6/14.      Limitations  Walking;Other (comment)   bending big toe    How long can you sit comfortably?  n/a    How long can you stand comfortably?  n/a    How long can you walk comfortably?  uphill uncomfortable.     Diagnostic tests  see chart for imaging results    Patient Stated Goals  see above    Currently in Pain?  No/denies       Manual Therapy  Intermetatarsal grade III joint mobs 5x30 seconds. Between 1st and 2nd, 2nd and 3rd, and 3rd and 4th metatarsals  Great toe AP and PA grade III joint mobs at both MTP and IP joints, 4 x 30 seconds each segment; Great toe MTP and IP distraction 30s x 3 both; Great toe MET flexion stretch 5s contract/5s hold x 5;  Distraction and abduction 15x 5 second

## 2018-06-05 ENCOUNTER — Encounter: Payer: Self-pay | Admitting: Podiatry

## 2018-06-05 ENCOUNTER — Ambulatory Visit: Payer: No Typology Code available for payment source | Admitting: Podiatry

## 2018-06-05 ENCOUNTER — Ambulatory Visit (INDEPENDENT_AMBULATORY_CARE_PROVIDER_SITE_OTHER): Payer: No Typology Code available for payment source

## 2018-06-05 DIAGNOSIS — M2012 Hallux valgus (acquired), left foot: Secondary | ICD-10-CM | POA: Diagnosis not present

## 2018-06-05 DIAGNOSIS — Z9889 Other specified postprocedural states: Secondary | ICD-10-CM

## 2018-06-05 NOTE — Progress Notes (Signed)
She presents today for follow-up of her bunionectomy to her left foot.  States that she completed physical therapy last week and states that she is doing better than she was.  States that she still has tenderness even beneath the second and third metatarsal phalangeal joints.  Objective: Vital signs are stable she is alert oriented x3 much increased range of motion of the first metatarsophalangeal joint plantar flexion is good dorsiflexion still somewhat limited.  Though she does have tenderness on sharp dorsiflexion of the second and third metatarsal phalangeal joints more than likely compensatory nature regarding the first metatarsophalangeal joint range of motion deficit.  Assessment: Well-healing surgical foot.  Plan: Increase activity which will increase range of motion over time we will follow-up with me in 3 months if necessary.

## 2018-08-03 ENCOUNTER — Ambulatory Visit (INDEPENDENT_AMBULATORY_CARE_PROVIDER_SITE_OTHER): Payer: Self-pay | Admitting: Physician Assistant

## 2018-08-03 DIAGNOSIS — Z23 Encounter for immunization: Secondary | ICD-10-CM

## 2018-08-03 NOTE — Patient Instructions (Signed)
DTaP Vaccine (Diphtheria, Tetanus, and Pertussis): What You Need to Know 1. Why get vaccinated? Diphtheria, tetanus, and pertussis are serious diseases caused by bacteria. Diphtheria and pertussis are spread from person to person. Tetanus enters the body through cuts or wounds. DIPHTHERIA causes a thick covering in the back of the throat.  It can lead to breathing problems, paralysis, heart failure, and even death.  TETANUS (Lockjaw) causes painful tightening of the muscles, usually all over the body.  It can lead to "locking" of the jaw so the victim cannot open his mouth or swallow. Tetanus leads to death in up to 2 out of 10 cases.  PERTUSSIS (Whooping Cough) causes coughing spells so bad that it is hard for infants to eat, drink, or breathe. These spells can last for weeks.  It can lead to pneumonia, seizures (jerking and staring spells), brain damage, and death.  Diphtheria, tetanus, and pertussis vaccine (DTaP) can help prevent these diseases. Most children who are vaccinated with DTaP will be protected throughout childhood. Many more children would get these diseases if we stopped vaccinating. DTaP is a safer version of an older vaccine called DTP. DTP is no longer used in the [place]. 2. Who should get DTaP vaccine and when? Children should get 5 doses of DTaP vaccine, one dose at each of the following ages:  2 months  4 months  6 months  15-18 months  4-6 years  DTaP may be given at the same time as other vaccines. 3. Some children should not get DTaP vaccine or should wait  Children with minor illnesses, such as a cold, may be vaccinated. But children who are moderately or severely ill should usually wait until they recover before getting DTaP vaccine.  Any child who had a life-threatening allergic reaction after a dose of DTaP should not get another dose.  Any child who suffered a brain or nervous system disease within 7 days after a dose of DTaP should not get  another dose.  Talk with your doctor if your child: ? had a seizure or collapsed after a dose of DTaP, ? cried non-stop for 3 hours or more after a dose of DTaP, ? had a fever over 105F after a dose of DTaP. Ask your doctor for more information. Some of these children should not get another dose of pertussis vaccine, but may get a vaccine without pertussis, called DT. 4. Older children and adults DTaP is not licensed for adolescents, adults, or children 83 years of age and older. But older people still need protection. A vaccine called Tdap is similar to DTaP. A single dose of Tdap is recommended for people 11 through 42 years of age. Another vaccine, called Td, protects against tetanus and diphtheria, but not pertussis. It is recommended every 10 years. There are separate Vaccine Information Statements for these vaccines. 5. What are the risks from DTaP vaccine? Getting diphtheria, tetanus, or pertussis disease is much riskier than getting DTaP vaccine. However, a vaccine, like any medicine, is capable of causing serious problems, such as severe allergic reactions. The risk of DTaP vaccine causing serious harm, or death, is extremely small. Mild problems (common)  Fever (up to about 1 child in 4)  Redness or swelling where the shot was given (up to about 1 child in 4)  Soreness or tenderness where the shot was given (up to about 1 child in 4) These problems occur more often after the 4th and 5th doses of the DTaP series than after  earlier doses. Sometimes the 4th or 5th dose of DTaP vaccine is followed by swelling of the entire arm or leg in which the shot was given, lasting 1-7 days (up to about 1 child in 30). Other mild problems include:  Fussiness (up to about 1 child in 3)  Tiredness or poor appetite (up to about 1 child in 10)  Vomiting (up to about 1 child in 50) These problems generally occur 1-3 days after the shot. Moderate problems (uncommon)  Seizure (jerking or staring)  (about 1 child out of 14,000)  Non-stop crying, for 3 hours or more (up to about 1 child out of 1,000)  High fever, over 105F (about 1 child out of 16,000) Severe problems (very rare)  Serious allergic reaction (less than 1 out of a million doses)  Several other severe problems have been reported after DTaP vaccine. These include: ? Long-term seizures, coma, or lowered consciousness ? Permanent brain damage. These are so rare it is hard to tell if they are caused by the vaccine. Controlling fever is especially important for children who have had seizures, for any reason. It is also important if another family member has had seizures. You can reduce fever and pain by giving your child an aspirin-free pain reliever when the shot is given, and for the next 24 hours, following the package instructions. 6. What if there is a serious reaction? What should I look for? Look for anything that concerns you, such as signs of a severe allergic reaction, very high fever, or behavior changes. Signs of a severe allergic reaction can include hives, swelling of the face and throat, difficulty breathing, a fast heartbeat, dizziness, and weakness. These would start a few minutes to a few hours after the vaccination. What should I do?  If you think it is a severe allergic reaction or other emergency that can't wait, call 9-1-1 or get the person to the nearest hospital. Otherwise, call your doctor.  Afterward, the reaction should be reported to the Vaccine Adverse Event Reporting System (VAERS). Your doctor might file this report, or you can do it yourself through the VAERS web site at www.vaers.LAgents.no, or by calling 1-(409)163-1477. ? VAERS is only for reporting reactions. They do not give medical advice. 7. The National Vaccine Injury Compensation Program The [employer] Vaccine Injury Compensation Program (VICP) is a federal program that was created to compensate people who may have been injured by certain  vaccines. Persons who believe they may have been injured by a vaccine can learn about the program and about filing a claim by calling 1-239-540-0327 or visiting the VICP website at SpiritualWord.at. 8. How can I learn more?  Ask your doctor.  Call your local or state health department.  Contact the Centers for Disease Control and Prevention (CDC): ? Call 732-868-8927 (1-800-CDC-INFO) or ? Visit CDC's website at PicCapture.uy CDC DTaP Vaccine (Diphtheria, Tetanus, and Pertussis) VIS (03/03/06) This information is not intended to replace advice given to you by your health care provider. Make sure you discuss any questions you have with your health care provider. Document Released: 08/01/2006 Document Revised: 06/24/2016 Document Reviewed: 06/24/2016 Elsevier Interactive Patient Education  2017 Elsevier Inc. Influenza (Flu) Vaccine (Inactivated or Recombinant): What You Need to Know 1. Why get vaccinated? Influenza ("flu") is a contagious disease that spreads around the [place] every year, usually between October and May. Flu is caused by influenza viruses, and is spread mainly by coughing, sneezing, and close contact. Anyone can get flu. Flu strikes  suddenly and can last several days. Symptoms vary by age, but can include:  fever/chills  sore throat  muscle aches  fatigue  cough  headache  runny or stuffy nose  Flu can also lead to pneumonia and blood infections, and cause diarrhea and seizures in children. If you have a medical condition, such as heart or lung disease, flu can make it worse. Flu is more dangerous for some people. Infants and young children, people 51 years of age and older, pregnant women, and people with certain health conditions or a weakened immune system are at greatest risk. Each year thousands of people in the [place] die from flu, and many more are hospitalized. Flu vaccine can:  keep you from getting flu,  make flu  less severe if you do get it, and  keep you from spreading flu to your family and other people. 2. Inactivated and recombinant flu vaccines A dose of flu vaccine is recommended every flu season. Children 6 months through 61 years of age may need two doses during the same flu season. Everyone else needs only one dose each flu season. Some inactivated flu vaccines contain a very small amount of a mercury-based preservative called thimerosal. Studies have not shown thimerosal in vaccines to be harmful, but flu vaccines that do not contain thimerosal are available. There is no live flu virus in flu shots. They cannot cause the flu. There are many flu viruses, and they are always changing. Each year a new flu vaccine is made to protect against three or four viruses that are likely to cause disease in the upcoming flu season. But even when the vaccine doesn't exactly match these viruses, it may still provide some protection. Flu vaccine cannot prevent:  flu that is caused by a virus not covered by the vaccine, or  illnesses that look like flu but are not.  It takes about 2 weeks for protection to develop after vaccination, and protection lasts through the flu season. 3. Some people should not get this vaccine Tell the person who is giving you the vaccine:  If you have any severe, life-threatening allergies. If you ever had a life-threatening allergic reaction after a dose of flu vaccine, or have a severe allergy to any part of this vaccine, you may be advised not to get vaccinated. Most, but not all, types of flu vaccine contain a small amount of egg protein.  If you ever had Guillain-Barr Syndrome (also called GBS). Some people with a history of GBS should not get this vaccine. This should be discussed with your doctor.  If you are not feeling well. It is usually okay to get flu vaccine when you have a mild illness, but you might be asked to come back when you feel better.  4. Risks of a vaccine  reaction With any medicine, including vaccines, there is a chance of reactions. These are usually mild and go away on their own, but serious reactions are also possible. Most people who get a flu shot do not have any problems with it. Minor problems following a flu shot include:  soreness, redness, or swelling where the shot was given  hoarseness  sore, red or itchy eyes  cough  fever  aches  headache  itching  fatigue  If these problems occur, they usually begin soon after the shot and last 1 or 2 days. More serious problems following a flu shot can include the following:  There may be a small increased risk of Guillain-Barre  Syndrome (GBS) after inactivated flu vaccine. This risk has been estimated at 1 or 2 additional cases per million people vaccinated. This is much lower than the risk of severe complications from flu, which can be prevented by flu vaccine.  Young children who get the flu shot along with pneumococcal vaccine (PCV13) and/or DTaP vaccine at the same time might be slightly more likely to have a seizure caused by fever. Ask your doctor for more information. Tell your doctor if a child who is getting flu vaccine has ever had a seizure.  Problems that could happen after any injected vaccine:  People sometimes faint after a medical procedure, including vaccination. Sitting or lying down for about 15 minutes can help prevent fainting, and injuries caused by a fall. Tell your doctor if you feel dizzy, or have vision changes or ringing in the ears.  Some people get severe pain in the shoulder and have difficulty moving the arm where a shot was given. This happens very rarely.  Any medication can cause a severe allergic reaction. Such reactions from a vaccine are very rare, estimated at about 1 in a million doses, and would happen within a few minutes to a few hours after the vaccination. As with any medicine, there is a very remote chance of a vaccine causing a serious  injury or death. The safety of vaccines is always being monitored. For more information, visit: http://floyd.org/ 5. What if there is a serious reaction? What should I look for? Look for anything that concerns you, such as signs of a severe allergic reaction, very high fever, or unusual behavior. Signs of a severe allergic reaction can include hives, swelling of the face and throat, difficulty breathing, a fast heartbeat, dizziness, and weakness. These would start a few minutes to a few hours after the vaccination. What should I do?  If you think it is a severe allergic reaction or other emergency that can't wait, call 9-1-1 and get the person to the nearest hospital. Otherwise, call your doctor.  Reactions should be reported to the Vaccine Adverse Event Reporting System (VAERS). Your doctor should file this report, or you can do it yourself through the VAERS web site at www.vaers.LAgents.no, or by calling 1-417-183-5587. ? VAERS does not give medical advice. 6. The National Vaccine Injury Compensation Program The [employer] Vaccine Injury Compensation Program (VICP) is a federal program that was created to compensate people who may have been injured by certain vaccines. Persons who believe they may have been injured by a vaccine can learn about the program and about filing a claim by calling 1-949-852-5043 or visiting the VICP website at SpiritualWord.at. There is a time limit to file a claim for compensation. 7. How can I learn more?  Ask your healthcare provider. He or she can give you the vaccine package insert or suggest other sources of information.  Call your local or state health department.  Contact the Centers for Disease Control and Prevention (CDC): ? Call (705)413-2043 (1-800-CDC-INFO) or ? Visit CDC's website at BiotechRoom.com.cy Vaccine Information Statement, Inactivated Influenza Vaccine (05/24/2014) This information is not intended to replace advice given  to you by your health care provider. Make sure you discuss any questions you have with your health care provider. Document Released: 07/29/2006 Document Revised: 06/24/2016 Document Reviewed: 06/24/2016 Elsevier Interactive Patient Education  2017 [employer].

## 2018-08-03 NOTE — Progress Notes (Signed)
Pt presents here today for visit to receive Tdap and Fluenza shot vaccine. Allergies reviewed, vaccine given Tdap given in left deltoid and Influenza given in right deltoid , vaccine information statement provided, tolerated well.  Yvonne Kendall, RMA Registered Medical Assistant [place] [phone]

## 2018-08-30 ENCOUNTER — Other Ambulatory Visit: Payer: Self-pay | Admitting: Family Medicine

## 2018-08-31 ENCOUNTER — Other Ambulatory Visit: Payer: Self-pay | Admitting: Nurse Practitioner

## 2018-08-31 DIAGNOSIS — Z1231 Encounter for screening mammogram for malignant neoplasm of breast: Secondary | ICD-10-CM

## 2018-09-19 ENCOUNTER — Ambulatory Visit
Admission: RE | Admit: 2018-09-19 | Discharge: 2018-09-19 | Disposition: A | Discharge status: Home / Self Care | Payer: No Typology Code available for payment source | Source: Ambulatory Visit | Attending: Nurse Practitioner | Admitting: Nurse Practitioner

## 2018-09-19 DIAGNOSIS — Z1231 Encounter for screening mammogram for malignant neoplasm of breast: Secondary | ICD-10-CM | POA: Diagnosis not present

## 2018-09-21 ENCOUNTER — Other Ambulatory Visit: Payer: Self-pay | Admitting: Nurse Practitioner

## 2018-09-21 DIAGNOSIS — R928 Other abnormal and inconclusive findings on diagnostic imaging of breast: Secondary | ICD-10-CM

## 2018-09-21 DIAGNOSIS — N6310 Unspecified lump in the right breast, unspecified quadrant: Secondary | ICD-10-CM

## 2018-09-28 ENCOUNTER — Ambulatory Visit
Admission: RE | Admit: 2018-09-28 | Discharge: 2018-09-28 | Disposition: A | Discharge status: Home / Self Care | Payer: No Typology Code available for payment source | Source: Ambulatory Visit | Attending: Nurse Practitioner | Admitting: Nurse Practitioner

## 2018-09-28 ENCOUNTER — Other Ambulatory Visit: Payer: Self-pay | Admitting: Nurse Practitioner

## 2018-09-28 DIAGNOSIS — N6310 Unspecified lump in the right breast, unspecified quadrant: Secondary | ICD-10-CM | POA: Diagnosis present

## 2018-09-28 DIAGNOSIS — R928 Other abnormal and inconclusive findings on diagnostic imaging of breast: Secondary | ICD-10-CM

## 2018-10-02 ENCOUNTER — Other Ambulatory Visit: Payer: Self-pay | Admitting: Nurse Practitioner

## 2018-10-02 DIAGNOSIS — N6320 Unspecified lump in the left breast, unspecified quadrant: Secondary | ICD-10-CM

## 2018-10-18 HISTORY — PX: BUNIONECTOMY: SHX129

## 2018-11-21 ENCOUNTER — Encounter: Payer: Self-pay | Admitting: Physician Assistant

## 2018-11-21 ENCOUNTER — Ambulatory Visit (INDEPENDENT_AMBULATORY_CARE_PROVIDER_SITE_OTHER): Payer: Self-pay | Admitting: Physician Assistant

## 2018-11-21 ENCOUNTER — Other Ambulatory Visit: Payer: Self-pay | Admitting: Family Medicine

## 2018-11-21 VITALS — BP 124/82 | HR 82 | Temp 98.3°F | Resp 16 | Ht 64.0 in | Wt 150.0 lb

## 2018-11-21 DIAGNOSIS — J40 Bronchitis, not specified as acute or chronic: Secondary | ICD-10-CM

## 2018-11-21 DIAGNOSIS — R0982 Postnasal drip: Secondary | ICD-10-CM

## 2018-11-21 DIAGNOSIS — R110 Nausea: Secondary | ICD-10-CM

## 2018-11-21 MED ORDER — ONDANSETRON HCL 4 MG PO TABS: 4.0000 mg | ORAL_TABLET | Freq: Three times a day (TID) | ORAL | 0 refills | Status: DC | PRN

## 2018-11-21 MED ORDER — ALBUTEROL SULFATE HFA 108 (90 BASE) MCG/ACT IN AERS: 2.0000 | INHALATION_SPRAY | RESPIRATORY_TRACT | 0 refills | Status: DC | PRN

## 2018-11-21 MED ORDER — FAMOTIDINE 20 MG PO TABS: 20.0000 mg | ORAL_TABLET | Freq: Every day | ORAL | 0 refills | Status: DC

## 2018-11-21 MED ORDER — FLUTICASONE PROPIONATE 50 MCG/ACT NA SUSP: 2.0000 | Freq: Every day | NASAL | 0 refills | Status: AC

## 2018-11-21 MED ORDER — DOXYCYCLINE HYCLATE 100 MG PO TABS: 100.0000 mg | ORAL_TABLET | Freq: Two times a day (BID) | ORAL | 0 refills | Status: AC

## 2018-11-21 MED ORDER — PREDNISONE 50 MG PO TABS: 50.0000 mg | ORAL_TABLET | Freq: Every day | ORAL | 0 refills | Status: AC

## 2018-11-21 NOTE — Progress Notes (Addendum)
Patient ID: Jodi Ross DOB: 16-Jan-1976 AGE: 43 y.o. MRN: [id number]   PCP: Dianne Dun, MD   Chief Complaint:  Chief Complaint  Patient presents with  . Nausea    x6d  . Cough    x6d  . PND    x6d     Subjective:    HPI:  Jodi Ross is a 43 y.o. female presents for evaluation  Chief Complaint  Patient presents with  . Nausea    x6d  . Cough    x6d  . PND    x88d    43 year old female presents to [facility] with one week history of cough. Began with dry cough. Associated bilateral hip pain. Then developed nasal congestion, post-nasal drip, and nausea. Describes nausea as primarily lack of appetite/anorexia. Has been eating saltine crackers and scrambled eggs. Patient states cough because coarse sounding. Associated chest congestion. States cough is not productive. Has been using OTC Sudafed (on top of her daily Claritin-D) with no improvement. Denies chest tightness, wheezing, or shortness of breath. Patient states symptoms improving with exception to fatigue/malaise and nausea. Denies fever, chills, sweats, headache, ear pain, sinus pain, sore throat, chest pain, vomiting, diarrhea, abdominal pain.   Patient reports previous episodes of acid reflux, typically with tomato sauce. Has Protonix at home. Does not take regularly. Reports mild heartburn over past few days. Has been using over the counter Tums with minimal relief.   Patient denies smoking history. No asthma history. In Epic, old prescription for Flovent Diskus, patient denies every using inhaler, including albuterol inhaler. Patient with seasonal allergies, takes Claritin-D daily; reports presents primarily as itchy/watery eyes, rhinorrhea, and sneezing. No previous history of pneumonia. 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 Recent exposure to influenza and RSV.  Patient uses NuvaRing. Denies concern for pregnancy.  Patient regularly followed by Dr. Ruthe Mannan MD with  [facility]. Treated for hypertension. Controlled on Amlodipine.   A limited review of symptoms was performed, pertinent positives and negatives as mentioned in HPI.  The following portions of the patient's history were reviewed and updated as appropriate: allergies, current medications and past medical history.  Patient Active Problem List   Diagnosis Date Noted  . Well woman exam 02/20/2018  . 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 Alopecia 09/20/2013  . Menstrual cramps 11/11/2011  . History of miscarriage 02/04/2011  . ANEMIA-IRON DEFICIENCY 08/24/2010  . OTHER SPECIFIED ANEMIAS 08/24/2010  . Essential hypertension 08/24/2010  . ALLERGIC RHINITIS 08/24/2010    Allergies  Allergen Reactions  . Aspirin   . Sulfonamide Derivatives     Current Outpatient Medications on File Prior to Visit  Medication Sig Dispense Refill  . amLODipine (NORVASC) 10 MG tablet TAKE 1 TABLET (10 MG TOTAL) BY MOUTH DAILY. **NEED OFFICE VISIT** 90 tablet 3  . etonogestrel-ethinyl estradiol (NUVARING) 0.12-0.015 MG/24HR vaginal ring Place 1 each vaginally every 28 (twenty-eight) days. Insert vaginally and leave in place for 3 consecutive weeks, then remove for 1 week.    . loratadine-pseudoephedrine (ALLERGY RELIEF/NASAL DECONGEST) 10-240 MG 24 hr tablet Take 1 tablet by mouth daily. 30 tablet 0   No current facility-administered medications on file prior to visit.        Objective:   Vitals:   11/21/18 1123  BP: 124/82  Pulse: 82  Resp: 16  Temp: 98.3 F (36.8 C)  SpO2: 100%     Wt Readings from Last 3 Encounters:  11/21/18  150 lb (68 kg)  02/20/18 147 lb 3.2 oz (66.8 kg)  09/19/17 148 lb 12.8 oz (67.5 kg)    Physical Exam:   General Appearance:  Patient sitting comfortably on examination table. Conversational. Peri [place] self-historian. In no acute distress. Afebrile. Patient appears mildly ill/tired.  Head:  Normocephalic, without obvious abnormality, atraumatic   Eyes:  PERRL, conjunctiva/corneas clear, EOM's intact  Ears:  Bilateral ear canals WNL. No erythema or edema. No discharge/drainage. Bilateral TMs WNL. No erythema, injection, or serous effusion. No scar tissue.  Nose: Nares normal, septum midline. Nasal mucosa with bogginess and bilateral edema. No visible rhinorrhea. No sinus tenderness with percussion/palpation.  Throat: Lips, mucosa, and tongue normal; teeth and gums normal. Posterior pharynx with moderate postnasal drip visible. Throat reveals no erythema. Tonsils with no enlargement or exudate.  Neck: Supple, symmetrical, trachea midline, no adenopathy  Lungs:   Clear to auscultation bilaterally, respirations unlabored. Possibly diffusely diminished breath sounds, could be patient effort. No rales, rhonchi, crackles or wheezing. No wheezing with forced expiration. No cough elicited with deep inspiration or forced expiration. Tight/harsh cough few times during examination.  Heart:  Regular rate and rhythm, S1 and S2 normal, no murmur, rub, or gallop  Abdomen:   Soft, bowel sounds active all four quadrants, no tenderness.  Extremities: Extremities normal, atraumatic, no cyanosis or edema  Pulses: 2+ and symmetric  Skin: Skin color, texture, turgor normal, no rashes or lesions  Lymph nodes: Cervical, supraclavicular, and axillary nodes normal  Neurologic: Normal    Assessment & Plan:    Exam findings, diagnosis etiology and medication use and indications reviewed with patient. Follow-Up and discharge instructions provided. No emergent/urgent issues found on exam.  Patient education was provided.   Patient verbalized understanding of information provided and agrees with plan of care (POC), all questions answered. The patient is advised to call or return to clinic if condition does not see an improvement in symptoms, or to seek the care of the closest emergency department if condition worsens with the below plan.    1. Bronchitis -  predniSONE (DELTASONE) 50 MG tablet; Take 1 tablet (50 mg total) by mouth daily with breakfast for 5 days.  Dispense: 5 tablet; Refill: 0 - albuterol (PROVENTIL HFA;VENTOLIN HFA) 108 (90 Base) MCG/ACT inhaler; Inhale 2 puffs into the lungs every 4 (four) hours as needed.  Dispense: 1 Inhaler; Refill: 0 - doxycycline (VIBRA-TABS) 100 MG tablet; Take 1 tablet (100 mg total) by mouth 2 (two) times daily for 7 days.  Dispense: 14 tablet; Refill: 0  2. PND (post-nasal drip) - fluticasone (FLONASE) 50 MCG/ACT nasal spray; Place 2 sprays into both nostrils daily.  Dispense: 16 g; Refill: 0  3. Nausea - famotidine (PEPCID) 20 MG tablet; Take 1 tablet (20 mg total) by mouth daily for 7 days.  Dispense: 7 tablet; Refill: 0 - ondansetron (ZOFRAN) 4 MG tablet; Take 1 tablet (4 mg total) by mouth every 8 (eight) hours as needed for nausea or vomiting.  Dispense: 12 tablet; Refill: 0   Patient with one week history of cough. Associated nasal congestion, PND, and nausea. Cough initially dry, now coarse with chest congestion. No productivity. Patient afebrile, VSS, in no acute distress, clear lung sounds, pulse ox of 100%. Suspect viral URI, which led to bronchitis. Will treat with 5-day course of prednisone blast 50mg  qd and albuterol inhaler. Suspect nausea from PND and frequent coughing; prescribed Flonase for PND (advised patient may continue Claritin-D; however, informed patient not to take Sudafed  in addition to her 24HR Claritin-D, is double-dosing, may cause issues with her HTN and is unsafe), Zofran for nausea, and Pepcid to treat gastritis. If patient had influenza or RSV, should be improving by now, and does not rule out secondary bronchitis or pneumonia. Do not believe rapid flu or RSV test indicated. Discussed with patient, possibility of secondary pneumonia, given level of malaise. Gave wait & hold Doxycycline script. However, discussed with patient, preference would be patient go to PCP or urgent care in  a few days if not feeling better for further evaluation, including possibly a CXR. Patient agreed with plan.   Janalyn Harder, MHS, PA-C Rulon Sera, MHS, PA-C Advanced Practice Provider [facility]  [address] [facility], [address] [place], [place] [address] (p):  [phone] [email or web address]

## 2018-11-21 NOTE — Patient Instructions (Addendum)
Thank you for choosing [facility] for your health care needs.  You have been diagnosed with bronchitis (a chest cold).  Prescribed prednisone 50mg . Take 1 tab once a day x 5 days. Take with food. Take in the morning. You have been prescribed an albuterol inhaler, will help with cough.  Recommend increase fluids; water, Gatorade, hot tea with lemon/honey, or diluted juice (1/2 juice and 1/2 water). Rest. Take over the counter Tylenol or ibuprofen for pain/discomfort.  May use cool mist humidifier in bedroom at night, may help with cough. May prop self up with several pillows at night or sleep in recliner, may help with cough.  May continue over the counter Claritin-D. You have been prescribed Flonase nasal spray.  For nausea, may use prescribed Zofran. Have also been prescribed 1-week course of Pepcid for stomach protection.  May start antibiotic in 3-4 days if symptoms not improving - though, prefer you would see your family physician or go to urgent care.  Follow-up with family physician or urgent care in 3-4 days if symptoms not improving. Feel at that time, a chest x-ray would be warranted. Follow-up sooner with any worsening symptoms, including chest pain, shortness of breath, difficulty breathing.  Hope you feel better soon!   Acute Bronchitis, Adult Acute bronchitis is when air tubes (bronchi) in the lungs suddenly get swollen. The condition can make it hard to breathe. It can also cause these symptoms:  A cough.  Coughing up clear, yellow, or green mucus.  Wheezing.  Chest congestion.  Shortness of breath.  A fever.  Body aches.  Chills.  A sore throat. Follow these instructions at home:  Medicines  Take over-the-counter and prescription medicines only as told by your doctor.  If you were prescribed an antibiotic medicine, take it as told by your doctor. Do not stop taking the antibiotic even if you start to feel better. General  instructions  Rest.  Drink enough fluids to keep your pee (urine) pale yellow.  Avoid smoking and secondhand smoke. If you smoke and you need help quitting, ask your doctor. Quitting will help your lungs heal faster.  Use an inhaler, cool mist vaporizer, or humidifier as told by your doctor.  Keep all follow-up visits as told by your doctor. This is important. How is this prevented? To lower your risk of getting this condition again:  Wash your hands often with soap and water. If you cannot use soap and water, use hand sanitizer.  Avoid contact with people who have cold symptoms.  Try not to touch your hands to your mouth, nose, or eyes.  Make sure to get the flu shot every year. Contact a doctor if:  Your symptoms do not get better in 2 weeks. Get help right away if:  You cough up blood.  You have chest pain.  You have very bad shortness of breath.  You become dehydrated.  You faint (pass out) or keep feeling like you are going to pass out.  You keep throwing up (vomiting).  You have a very bad headache.  Your fever or chills gets worse. This information is not intended to replace advice given to you by your health care provider. Make sure you discuss any questions you have with your health care provider. Document Released: 03/22/2008 Document Revised: 05/18/2017 Document Reviewed: 03/24/2016 Elsevier Interactive Patient Education  2019 [employer].

## 2018-11-23 ENCOUNTER — Telehealth: Payer: Self-pay | Admitting: Emergency Medicine

## 2018-11-23 NOTE — Telephone Encounter (Signed)
Left message following up on visit with [facility] 

## 2019-02-19 ENCOUNTER — Telehealth: Payer: Self-pay

## 2019-02-19 NOTE — Telephone Encounter (Signed)
LMOVM asking pt to call the office and schedule an appointment for a Virtual Visit with Dr. Shelly Coss it has been a year since she has been seen here a visit must be made to justify for insurance/advised that we have appt's available as soon as [id number] dmf

## 2019-02-19 NOTE — Telephone Encounter (Signed)
Copied from CRM [id number]. Topic: Referral - Request for Referral >> Feb 19, 2019 12:43 PM Reggie Pile, NT wrote: Has patient seen PCP for this complaint? Yes.  *If NO, is insurance requiring patient see PCP for this issue before PCP can refer them? Referral for which specialty: pediatrist  Preferred provider/office: Dr.Hyatt/ [facility]  Reason for referral: Pain in foot again

## 2019-02-20 ENCOUNTER — Ambulatory Visit (INDEPENDENT_AMBULATORY_CARE_PROVIDER_SITE_OTHER): Payer: No Typology Code available for payment source | Admitting: Family Medicine

## 2019-02-20 ENCOUNTER — Encounter: Payer: Self-pay | Admitting: Family Medicine

## 2019-02-20 VITALS — Temp 98.6°F | Ht 64.0 in | Wt 149.0 lb

## 2019-02-20 DIAGNOSIS — M21611 Bunion of right foot: Secondary | ICD-10-CM | POA: Diagnosis not present

## 2019-02-20 DIAGNOSIS — M79671 Pain in right foot: Secondary | ICD-10-CM

## 2019-02-20 DIAGNOSIS — M21612 Bunion of left foot: Secondary | ICD-10-CM | POA: Diagnosis not present

## 2019-02-20 DIAGNOSIS — M79672 Pain in left foot: Secondary | ICD-10-CM

## 2019-02-20 MED ORDER — AMLODIPINE BESYLATE 10 MG PO TABS: 10.0000 mg | ORAL_TABLET | Freq: Every day | ORAL | 0 refills | Status: DC

## 2019-02-20 MED ORDER — LORATADINE-PSEUDOEPHEDRINE ER 10-240 MG PO TB24: 1.0000 | ORAL_TABLET | Freq: Every day | ORAL | 3 refills | Status: DC

## 2019-02-20 NOTE — Progress Notes (Signed)
Virtual Visit via Video   Due to the COVID-19 pandemic, this visit was completed with telemedicine (audio/video) technology to reduce patient and provider exposure as well as to preserve personal protective equipment.   I connected with Marval RegalMarcel A Lynds by a video enabled telemedicine application and verified that I am speaking with the correct person using two identifiers. Location patient: Home Location provider: [facility], Office Persons participating in the virtual visit: Bertram DenverMarcel A Demchak, Christabel Camire, MD   I discussed the limitations of evaluation and management by telemedicine and the availability of in person appointments. The patient expressed understanding and agreed to proceed.  Care Team   Patient Care Team: Dianne DunAron, Charmel Pronovost M, MD as PCP - General  Subjective:   HPI:  Left foot pain- located in middle top side. Worse when stands without shoes. She is needing a referral to [facility] to see Dr. Al CorpusHyatt. Who has treated her for foot issues in the past. Her and her husband walk a total of 4-6 miles in a day.  No redness or warmth.  No loss of sensation.  Review of Systems  Constitutional: Negative.   HENT: Negative.   Eyes: Negative.   Respiratory: Negative.   Cardiovascular: Negative.   Gastrointestinal: Negative.   Genitourinary: Negative.   Musculoskeletal: Positive for joint pain.  Skin: Negative.   Neurological: Negative.   Endo/Heme/Allergies: Negative.   All other systems reviewed and are negative.    Patient Active Problem List   Diagnosis Date Noted  . Well woman exam 02/20/2018  . 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 Alopecia 09/20/2013  . Menstrual cramps 11/11/2011  . History of miscarriage 02/04/2011  . ANEMIA-IRON DEFICIENCY 08/24/2010  . OTHER SPECIFIED ANEMIAS 08/24/2010  . Essential hypertension 08/24/2010  . ALLERGIC RHINITIS 08/24/2010    Social History   Tobacco Use  . Smoking status: Never Smoker  . Smokeless tobacco: Never Used   Substance Use Topics  . Alcohol use: No    Current Outpatient Medications:  .  amLODipine (NORVASC) 10 MG tablet, Take 1 tablet (10 mg total) by mouth daily., Disp: 90 tablet, Rfl: 0 .  etonogestrel-ethinyl estradiol (NUVARING) 0.12-0.015 MG/24HR vaginal ring, Place 1 each vaginally every 28 (twenty-eight) days. Insert vaginally and leave in place for 3 consecutive weeks, then remove for 1 week., Disp: , Rfl:  .  fluticasone (FLONASE) 50 MCG/ACT nasal spray, Place 2 sprays into both nostrils daily., Disp: 16 g, Rfl: 0 .  loratadine-pseudoephedrine (ALLERGY RELIEF/NASAL DECONGEST) 10-240 MG 24 hr tablet, Take 1 tablet by mouth daily., Disp: 90 tablet, Rfl: 3 .  ondansetron (ZOFRAN) 4 MG tablet, Take 1 tablet (4 mg total) by mouth every 8 (eight) hours as needed for nausea or vomiting. (Patient not taking: Reported on 02/20/2019), Disp: 12 tablet, Rfl: 0  Allergies  Allergen Reactions  . Aspirin   . Sulfonamide Derivatives     Objective:  Temp 98.6 F (37 C) (Oral)   Ht 5\' 4"  (1.626 m)   Wt 149 lb (67.6 kg)   BMI 25.58 kg/m   VITALS: Per patient if applicable, see vitals. GENERAL: Alert, appears well and in no acute distress. HEENT: Atraumatic, conjunctiva clear, no obvious abnormalities on inspection of external nose and ears. NECK: Normal movements of the head and neck. CARDIOPULMONARY: No increased WOB. Speaking in clear sentences. I:E ratio WNL.  MS: Moves all visible extremities without noticeable abnormality. PSYCH: Pleasant and cooperative, well-groomed. Speech normal rate and rhythm. Affect is appropriate. Insight and judgement are  appropriate. Attention is focused, linear, and appropriate.  NEURO: CN grossly intact. Oriented as arrived to appointment on time with no prompting. Moves both UE equally.  SKIN: No obvious lesions, wounds, erythema, or cyanosis noted on face or hands.  Depression screen [facility] 2/9 02/20/2018 09/19/2017  Decreased Interest 0 0  Down, Depressed, Hopeless  0 0  PHQ - 2 Score 0 0    Assessment and Plan:   There are no diagnoses linked to this encounter.  Marland Kitchen COVID-19 Education: The signs and symptoms of COVID-19 were discussed with the patient and how to seek care for testing if needed. The importance of social distancing was discussed today. . Reviewed expectations re: course of current medical issues. . Discussed self-management of symptoms. . Outlined signs and symptoms indicating need for more acute intervention. . Patient verbalized understanding and all questions were answered. Marland Kitchen Health Maintenance issues including appropriate healthy diet, exercise, and smoking avoidance were discussed with patient. . See orders for this visit as documented in the electronic medical record.  Ruthe Mannan, MD  Records requested if needed. Time spent: 5 minutes, of which >50% was spent in obtaining information about her symptoms, reviewing her previous labs, evaluations, and treatments, counseling her about her condition (please see the discussed topics above), and developing a plan to further investigate it; she had a number of questions which I addressed.

## 2019-02-20 NOTE — Assessment & Plan Note (Signed)
She is asking for a referral to see Dr. Al Corpus as he has seen her in the past for foot pain and bunions.  Referral placed. Orders Placed This Encounter  Procedures  . Ambulatory referral to Podiatry

## 2019-03-01 ENCOUNTER — Encounter: Payer: Self-pay | Admitting: Podiatry

## 2019-03-01 ENCOUNTER — Ambulatory Visit: Payer: No Typology Code available for payment source | Admitting: Podiatry

## 2019-03-01 ENCOUNTER — Other Ambulatory Visit: Payer: Self-pay

## 2019-03-01 ENCOUNTER — Ambulatory Visit (INDEPENDENT_AMBULATORY_CARE_PROVIDER_SITE_OTHER): Payer: No Typology Code available for payment source

## 2019-03-01 VITALS — Temp 98.1°F

## 2019-03-01 DIAGNOSIS — T847XXA Infection and inflammatory reaction due to other internal orthopedic prosthetic devices, implants and grafts, initial encounter: Secondary | ICD-10-CM

## 2019-03-01 DIAGNOSIS — M778 Other enthesopathies, not elsewhere classified: Secondary | ICD-10-CM

## 2019-03-01 DIAGNOSIS — M779 Enthesopathy, unspecified: Secondary | ICD-10-CM

## 2019-03-01 DIAGNOSIS — M722 Plantar fascial fibromatosis: Secondary | ICD-10-CM

## 2019-03-01 NOTE — Patient Instructions (Signed)
Pre-Operative Instructions  Congratulations, you have decided to take an important step towards improving your quality of life.  You can be assured that the doctors and staff at [facility] will be with you every step of the way.  Here are some important things you should know:  1. Plan to be at the surgery center/hospital at least 1 (one) hour prior to your scheduled time, unless otherwise directed by the surgical center/hospital staff.  You must have a responsible adult accompany you, remain during the surgery and drive you home.  Make sure you have directions to the surgical center/hospital to ensure you arrive on time. 2. If you are having surgery at [facility] or [facility] hospitals, you will need a copy of your medical history and physical form from your family physician within one month prior to the date of surgery. We will give you a form for your primary physician to complete.  3. We make every effort to accommodate the date you request for surgery.  However, there are times where surgery dates or times have to be moved.  We will contact you as soon as possible if a change in schedule is required.   4. No aspirin/ibuprofen for one week before surgery.  If you are on aspirin, any non-steroidal anti-inflammatory medications (Mobic, Aleve, Ibuprofen) should not be taken seven (7) days prior to your surgery.  You make take Tylenol for pain prior to surgery.  5. Medications - If you are taking daily heart and blood pressure medications, seizure, reflux, allergy, asthma, anxiety, pain or diabetes medications, make sure you notify the surgery center/hospital before the day of surgery so they can tell you which medications you should take or avoid the day of surgery. 6. No food or drink after midnight the night before surgery unless directed otherwise by surgical center/hospital staff. 7. No alcoholic beverages 24-hours prior to surgery.  No smoking 24-hours prior or 24-hours after  surgery. 8. Wear loose pants or shorts. They should be loose enough to fit over bandages, boots, and casts. 9. Don't wear slip-on shoes. Sneakers are preferred. 10. Bring your boot with you to the surgery center/hospital.  Also bring crutches or a walker if your physician has prescribed it for you.  If you do not have this equipment, it will be provided for you after surgery. 11. If you have not been contacted by the surgery center/hospital by the day before your surgery, call to confirm the date and time of your surgery. 12. Leave-time from work may vary depending on the type of surgery you have.  Appropriate arrangements should be made prior to surgery with your employer. 13. Prescriptions will be provided immediately following surgery by your doctor.  Fill these as soon as possible after surgery and take the medication as directed. Pain medications will not be refilled on weekends and must be approved by the doctor. 14. Remove nail polish on the operative foot and avoid getting pedicures prior to surgery. 15. Wash the night before surgery.  The night before surgery wash the foot and leg well with water and the antibacterial soap provided. Be sure to pay special attention to beneath the toenails and in between the toes.  Wash for at least three (3) minutes. Rinse thoroughly with water and dry well with a towel.  Perform this wash unless told not to do so by your physician.  Enclosed: 1 Ice pack (please put in freezer the night before surgery)   1 Hibiclens skin cleaner     Pre-op instructions  If you have any questions regarding the instructions, please do not hesitate to call our office.  [place]: [address], [place], [place] [address] -- [phone]  [place]: [address] [place], [place] [address] -- [phone]  [place]: [address]  [place], [place] [address] -- [phone]  [place]: [address], [address], [place], [place] [address] -- [phone]  Website:  [email or web address] 

## 2019-03-01 NOTE — Progress Notes (Signed)
She presents today previous surgery date 02/17/2018 status post Eliberto Ivory bunionectomy left states that I been having pain in the middle of the foot and around the first metatarsal phalangeal joint.  She states that it hurts with or without shoes.  Objective: Vital signs are stable she is alert and oriented x3.  Pulses are palpable.  No change in her past medical history medications or allergies.  She has limited range of motion with tenderness on palpation and range of motion of the first metatarsophalangeal joint left.  Radiographs taken today demonstrate no significant acute abnormalities though she does have some regression of the screw from the first metatarsal.  Assessment: Pain in limb secondary to lack of motion of the first metatarsophalangeal joint or contraction.  Also painful internal fixation.  Plan: Discussed etiology pathology conservative surgical therapies this point time went ahead and consented her for surgery consisting of a removal of internal fixation and release of the first metatarsal phalangeal joint.  We did discuss the possible postop complications which may include but not limited to postop pain bleeding swelling infection recurrence need for further surgery overcorrection under correction loss of digit loss of limb loss of life.  I will follow-up with her in the near future for surgery.

## 2019-03-09 ENCOUNTER — Telehealth: Payer: Self-pay | Admitting: *Deleted

## 2019-03-09 NOTE — Telephone Encounter (Signed)
"  I was at Dr. Geryl Rankins office.  I was supposed to receive a call from you about scheduling my surgery.  Please give me a call when you get a chance."  I'm returning your call.  Dr. Al Corpus can do your surgery on 03/30/2019.  "That date will be fine."  You need to go online and register with the surgical center.  "Do you call and get my FMLA papers?"  You need to contact your Recruitment consultant.

## 2019-03-22 ENCOUNTER — Telehealth: Payer: Self-pay | Admitting: *Deleted

## 2019-03-22 NOTE — Telephone Encounter (Signed)
DOS 03/30/2019, CPT CODE: 90240 -REMOVAL FIXATION DEEP KWIRE/SCREW LEFT FOOT  FOCUS: Effective - 10/18/2017  Deductible: $500 Facility co-pay Co-Insurance: 100% professional Max out of pocket: $2500  AUTHORIZATION IS REQUIRED:  Please fax clinicals to [phone].  Reference number for the call is [id number].  ------------------------------------------------------------------------------------------------------------------------------------------------------------ I faxed clinicals to Fannie Knee.

## 2019-03-25 ENCOUNTER — Other Ambulatory Visit: Payer: Self-pay

## 2019-03-25 ENCOUNTER — Ambulatory Visit (INDEPENDENT_AMBULATORY_CARE_PROVIDER_SITE_OTHER): Payer: Self-pay | Admitting: Nurse Practitioner

## 2019-03-25 VITALS — BP 115/75 | HR 96 | Temp 98.3°F | Resp 16 | Wt 159.0 lb

## 2019-03-25 DIAGNOSIS — W540XXA Bitten by dog, initial encounter: Secondary | ICD-10-CM

## 2019-03-25 DIAGNOSIS — S61451A Open bite of right hand, initial encounter: Secondary | ICD-10-CM

## 2019-03-25 MED ORDER — MUPIROCIN 2 % EX OINT: 1.0000 "application " | TOPICAL_OINTMENT | Freq: Two times a day (BID) | CUTANEOUS | 0 refills | Status: DC

## 2019-03-25 MED ORDER — MUPIROCIN 2 % EX OINT: 1.0000 "application " | TOPICAL_OINTMENT | Freq: Two times a day (BID) | CUTANEOUS | 0 refills | Status: AC

## 2019-03-25 MED ORDER — AMOXICILLIN-POT CLAVULANATE 875-125 MG PO TABS: 1.0000 | ORAL_TABLET | Freq: Two times a day (BID) | ORAL | 0 refills | Status: AC

## 2019-03-25 NOTE — Patient Instructions (Addendum)
Animal Bite, Adult -Take medication as prescribed. -Ibuprofen or Tylenol for pain, fever, or general discomfort. -Clean the affected area twice daily with Hibiclens soap until symptoms improve. -Ice and elevation to help with swelling.  I would apply ice 3-4 times per day for 10 to 15-minute increments. -Keep the area clean and dry.  You need to keep it wrapped and covered to prevent further risk of infection.  When possible, try to leave the area open to air to help promote healing. -Monitor the site for signs of infection to include increased swelling, redness or streaking up the arm or down the hand, foul-smelling drainage, numbness/ tingling in the hand, or if you develop fever, chills, malaise, or other concerns. -Follow-up in our [place] office if possible within the next 48 hours for a wound recheck. -Work note provided for today, will allow patient to return on 03/26/2019.   Animal bites range from mild to serious. An animal bite can result in any of these injuries:  A scratch.  A deep, open cut.  A puncture of the skin.  A crush injury.  Tearing away of the skin or a body part.  A bone injury. A small bite from a house pet is usually less serious than a bite from a stray or wild animal, such as a raccoon, fox, skunk, or bat. That is because stray and wild animals have a higher risk of carrying a serious infection called rabies, which can be passed to humans through a bite. What increases the risk? You are more likely to be bitten by an animal if:  You are around unfamiliar pets.  You disturb an animal when it is eating, sleeping, or caring for its babies.  You are outdoors in a place where small, wild animals roam freely. What are the signs or symptoms? Common symptoms of an animal bite include:  Pain.  Bleeding.  Swelling.  Bruising. How is this diagnosed? This condition may be diagnosed based on a physical exam and medical history. Your health care provider  will examine your wound and ask for details about the animal and how the bite happened. You may also have tests, such as:  Blood tests to check for infection.  X-rays to check for damage to bones or joints.  Taking a fluid sample from your wound and checking it for infection (culture test). How is this treated? Treatment varies depending on the type of animal, where the bite is on your body, and your medical history. Treatment may include:  Caring for the wound. This often includes cleaning the wound, rinsing out (flushing) the wound with saline solution, and applying a bandage (dressing). In some cases, the wound may be closed with stitches (sutures), staples, skin glue, or adhesive strips.  Antibiotic medicine to prevent or treat infection. This medicine may be prescribed in pill or ointment form. If the bite area becomes infected, the medicine may be given through an IV.  A tetanus shot to prevent tetanus infection.  Rabies treatment to prevent rabies infection. This will be done if the animal could have rabies.  Surgery. This may be done if a bite gets infected or if there is damage that needs to be repaired. Follow these instructions at home: Wound care   Follow instructions from your health care provider about how to take care of your wound. Make sure you: ? Wash your hands with soap and water before you change your dressing. If soap and water are not available, use hand sanitizer. ?  Change your dressing as told by your health care provider. ? Leave sutures, skin glue, or adhesive strips in place. These skin closures may need to stay in place for 2 weeks or longer. If adhesive strip edges start to loosen and curl up, you may trim the loose edges. Do not remove adhesive strips completely unless your health care provider tells you to do that.  Check your wound every day for signs of infection. Check for: ? More redness, swelling, or pain. ? More fluid or blood. ? Warmth. ? Pus or  a bad smell. Medicines  Take or apply over-the-counter and prescription medicines only as told by your health care provider.  If you were prescribed an antibiotic, take or apply it as told by your health care provider. Do not stop using the antibiotic even if your condition improves. General instructions   Keep the injured area raised (elevated) above the level of your heart while you are sitting or lying down, if this is possible.  If directed, put ice on the injured area. ? Put ice in a plastic bag. ? Place a towel between your skin and the bag. ? Leave the ice on for 20 minutes, 2-3 times per day.  Keep all follow-up visits as told by your health care provider. This is important. Contact a health care provider if:  You have more redness, swelling, or pain around your wound.  Your wound feels warm to the touch.  You have a fever or chills.  You have a general feeling of sickness (malaise).  You feel nauseous or you vomit.  You have pain that does not get better. Get help right away if:  You have a red streak that leads away from your wound.  You have non-clear fluid or more blood coming from your wound.  There is pus or a bad smell coming from your wound.  You have trouble moving your injured area.  You have numbness or tingling that extends beyond the wound. Summary  Animal bites can range from mild to serious. An animal bite can cause a scratch on the skin, a deep open cut, a puncture of the skin, a crush injury, tearing away of the skin or a body part, or a bone injury.  Your health care provider will examine your wound and ask for details about the animal and how the bite happened.  You may also have tests such as a blood test, X-ray, or testing of a fluid sample from your wound (culture test).  Treatment may include wound care, antibiotic medicine, a tetanus shot, and rabies treatment if the animal could have rabies. This information is not intended to replace  advice given to you by your health care provider. Make sure you discuss any questions you have with your health care provider. Document Released: 06/22/2011 Document Revised: 04/14/2017 Document Reviewed: 04/14/2017 Elsevier Interactive Patient Education  2019 [employer]  Wound Care, Adult Taking care of your wound properly can help to prevent pain, infection, and scarring. It can also help your wound to heal more quickly. How to care for your wound Wound care      Follow instructions from your health care provider about how to take care of your wound. Make sure you: ? Wash your hands with soap and water before you change the bandage (dressing). If soap and water are not available, use hand sanitizer. ? Change your dressing as told by your health care provider. ? Leave stitches (sutures), skin glue, or adhesive strips  in place. These skin closures may need to stay in place for 2 weeks or longer. If adhesive strip edges start to loosen and curl up, you may trim the loose edges. Do not remove adhesive strips completely unless your health care provider tells you to do that.  Check your wound area every day for signs of infection. Check for: ? Redness, swelling, or pain. ? Fluid or blood. ? Warmth. ? Pus or a bad smell.  Ask your health care provider if you should clean the wound with mild soap and water. Doing this may include: ? Using a clean towel to pat the wound dry after cleaning it. Do not rub or scrub the wound. ? Applying a cream or ointment. Do this only as told by your health care provider. ? Covering the incision with a clean dressing.  Ask your health care provider when you can leave the wound uncovered.  Keep the dressing dry until your health care provider says it can be removed. Do not take baths, swim, use a hot tub, or do anything that would put the wound underwater until your health care provider approves. Ask your health care provider if you can take showers. You may  only be allowed to take sponge baths. Medicines   If you were prescribed an antibiotic medicine, cream, or ointment, take or use the antibiotic as told by your health care provider. Do not stop taking or using the antibiotic even if your condition improves.  Take over-the-counter and prescription medicines only as told by your health care provider. If you were prescribed pain medicine, take it 30 or more minutes before you do any wound care or as told by your health care provider. General instructions  Return to your normal activities as told by your health care provider. Ask your health care provider what activities are safe.  Do not scratch or pick at the wound.  Do not use any products that contain nicotine or tobacco, such as cigarettes and e-cigarettes. These may delay wound healing. If you need help quitting, ask your health care provider.  Keep all follow-up visits as told by your health care provider. This is important.  Eat a diet that includes protein, vitamin A, vitamin C, and other nutrient-rich foods to help the wound heal. ? Foods rich in protein include meat, dairy, beans, nuts, and other sources. ? Foods rich in vitamin A include carrots and dark green, leafy vegetables. ? Foods rich in vitamin C include citrus, tomatoes, and other fruits and vegetables. ? Nutrient-rich foods have protein, carbohydrates, fat, vitamins, or minerals. Eat a variety of healthy foods including vegetables, fruits, and whole grains. Contact a health care provider if:  You received a tetanus shot and you have swelling, severe pain, redness, or bleeding at the injection site.  Your pain is not controlled with medicine.  You have redness, swelling, or pain around the wound.  You have fluid or blood coming from the wound.  Your wound feels warm to the touch.  You have pus or a bad smell coming from the wound.  You have a fever or chills.  You are nauseous or you vomit.  You are dizzy.  Get help right away if:  You have a red streak going away from your wound.  The edges of the wound open up and separate.  Your wound is bleeding, and the bleeding does not stop with gentle pressure.  You have a rash.  You faint.  You have trouble breathing. Summary  Always wash your hands with soap and water before changing your bandage (dressing).  To help with healing, eat foods that are rich in protein, vitamin A, vitamin C, and other nutrients.  Check your wound every day for signs of infection. Contact your health care provider if you suspect that your wound is infected. This information is not intended to replace advice given to you by your health care provider. Make sure you discuss any questions you have with your health care provider. Document Released: 07/13/2008 Document Revised: 11/15/2017 Document Reviewed: 04/20/2016 Elsevier Interactive Patient Education  2019 [employer].

## 2019-03-25 NOTE — Progress Notes (Addendum)
Subjective:    Patient ID: Jodi Ross, female    DOB: September 21, 1976, 43 y.o.   MRN: [id number]  Patient is a 43 year old female who presents today for complaints of a dog bite to the palm of her right hand.  Patient informs her pit bull accidentally bit her when she was putting his leash on.  The patient suffered a puncture wound to the thenar region of the right hand.  Bleeding is controlled upon patient arrival.  The patient informs the dog's shots are up-to-date and she owns the dog.  The patient informs she had her last tetanus shot last year.  Rates current pain 5/10 at present. Patient denies fever, chills, abdominal pain, malaise, numbness or weakness in the hand, tingling in the hand, or decreased sensation.  Patient states she cleaned the area and applied Neosporin prior to arrival.  Patient also informs she took ibuprofen immediately after the accident happened.  The patient denies any past medical history of diabetes and is not immunosuppressed.    Animal Bite   The incident occurred just prior to arrival. The incident occurred at home. Finger injury location: Palm of right hand. The pain is mild. It is unlikely that a foreign body is present. Pertinent negatives include no nausea, no vomiting, no decreased responsiveness, no light-headedness, no tingling and no weakness. She is right-handed. Her tetanus status is UTD. She has been behaving normally. There were no sick contacts.    Past Medical History:  Diagnosis Date  . Allergy   . Anemia, iron deficiency   . G6PD (glucose 6 phosphatase deficiency)   . G6PD deficiency     Review of Systems  Constitutional: Negative.  Negative for decreased responsiveness.  HENT: Negative.   Eyes: Negative.   Respiratory: Negative.   Cardiovascular: Negative.   Gastrointestinal: Negative.  Negative for nausea and vomiting.  Skin: Positive for wound ( Right hand).  Allergic/Immunologic: Positive for environmental allergies.  Neurological:  Negative.  Negative for tingling, weakness and light-headedness.      Objective: Blood pressure 115/75, pulse 96, temperature 98.3 F (36.8 C), temperature source Oral, resp. rate 16, weight 159 lb (72.1 kg), SpO2 98 %.   Physical Exam Constitutional:      General: She is not in acute distress. HENT:     Head: Normocephalic.     Nose: Nose normal.     Mouth/Throat:     Mouth: Mucous membranes are moist.  Eyes:     Pupils: Pupils are equal, round, and reactive to light.  Cardiovascular:     Rate and Rhythm: Normal rate and regular rhythm.     Pulses: Normal pulses.     Heart sounds: Normal heart sounds.  Pulmonary:     Effort: Pulmonary effort is normal.     Breath sounds: Normal breath sounds.  Musculoskeletal:       Arms:     Right hand: She exhibits tenderness, laceration (puncture wound to thenar eminence of right hand) and swelling. She exhibits normal range of motion and normal capillary refill. Normal sensation noted. Normal strength noted.     Comments: Approximately 0.5c to 1cm puncture wound to thenar eminence of right hand.  Damage to epidermis noted only.  Bleeding is controlled at this time.  Exposed tissue protruding from wound.  FROM to right hand.  +2 radial pulse. Tenderness to palpation of thenar region of right hand.  There is a moderate amount of swelling present to the thenar region.  Cap refill >2  sec. Skin is warm and dry.  Skin:    General: Skin is warm and dry.     Capillary Refill: Capillary refill takes less than 2 seconds.  Neurological:     General: No focal deficit present.     Mental Status: She is alert and oriented to person, place, and time.          Local anesthesia Lidocaine 1% without epi 2ml to wound of palm of right hand (thenar region), right hand cleaned with Hibiclens soap.  No sutures placed at this time.  Exposed tissue was removed.  Patient tolerated well. Wound cleaned with normal saline, antibiotic ointment applied with gauze and  coban dressing.     Assessment & Plan:   Exam findings, diagnosis etiology and medication use and indications reviewed with patient. Follow- Up and discharge instructions provided. No emergent/urgent issues found on exam.  Patient presented with puncture wound as result of a dog bite to the right hand.  Discussed with patient that wounds of this nature are not sutured or closed to prevent secondary infection.  We will go ahead and start the patient on Augmentin prophylactically along with topical mupirocin ointment to use daily.  I feel the patient does have significant swelling to the right hand as a result of the sustained trauma.  Patient will continue using ice and elevation to help decrease swelling.  The patient is well-appearing at this time as her vital signs are stable and she is in no acute distress.  I would like for the patient to follow-up in the [facility] office within the next 48 hours for a wound recheck. Additional instructions were provided to include cleansing the area with Hibiclens soap, ibuprofen or Tylenol for pain, fever, general discomfort, and wound care instructions.    Patient education was provided. Patient verbalized understanding of information provided and agrees with plan of care (POC), all questions answered. The patient is advised to call or return to clinic if condition does not see an improvement in symptoms, or to seek the care of the closest emergency department if condition worsens with the above plan.   1. Dog bite of right hand, initial encounter  - amoxicillin-clavulanate (AUGMENTIN) 875-125 MG tablet; Take 1 tablet by mouth 2 (two) times daily for 10 days.  Dispense: 20 tablet; Refill: 0 - mupirocin ointment (BACTROBAN) 2 %; Apply 1 application topically 2 (two) times daily for 14 days.   Dispense: 28 g; Refill: 0 -Take medication as prescribed. -Ibuprofen or Tylenol for pain, fever, or general discomfort. -Clean the affected area twice daily with Hibiclens  soap until symptoms improve. -Ice and elevation to help with swelling.  I would apply ice 3-4 times per day for 10 to 15-minute increments. -Keep the area clean and dry.  You need to keep it wrapped and covered to prevent further risk of infection.  When possible, try to leave the area open to air to help promote healing. -Monitor the site for signs of infection to include increased swelling, redness or streaking up the arm or down the hand, foul-smelling drainage, numbness/ tingling in the hand, or if you develop fever, chills, malaise, or other concerns. -Follow-up in our [place] office if possible within the next 48 hours for a wound recheck. -Work note provided for today, will allow patient to return on 03/26/2019.

## 2019-03-27 ENCOUNTER — Telehealth: Payer: Self-pay

## 2019-03-27 NOTE — Telephone Encounter (Signed)
Patient states she is feeling good.

## 2019-03-28 ENCOUNTER — Other Ambulatory Visit: Payer: Self-pay | Admitting: Podiatry

## 2019-03-28 MED ORDER — CEPHALEXIN 500 MG PO CAPS: 500.0000 mg | ORAL_CAPSULE | Freq: Three times a day (TID) | ORAL | 0 refills | Status: DC

## 2019-03-28 MED ORDER — ONDANSETRON HCL 4 MG PO TABS: 4.0000 mg | ORAL_TABLET | Freq: Three times a day (TID) | ORAL | 0 refills | Status: DC | PRN

## 2019-03-28 MED ORDER — OXYCODONE-ACETAMINOPHEN 10-325 MG PO TABS: 1.0000 | ORAL_TABLET | Freq: Four times a day (QID) | ORAL | 0 refills | Status: AC | PRN

## 2019-03-29 NOTE — Telephone Encounter (Signed)
I am calling to check on the status of an authorization request for surgery for Johnasia Liese.  "What are the procedure codes?"  The procedure codes are 20680 and 28270.  "I see an authorization for 20680.  The authorization number is [id number] and it is valid from 03/30/2019 until 04/27/2019.  Okay, what about the other code?  "What was the other code?"  It is 28270.  "I don't see anything for that code.  Would you like me to add this and check with the nurse now to see if she will authorize it?"  Yes, I would.  "Would you like me to call you back or do you want to hold?"  I'll wait.  "I'm going to put you on hold."  "The procedure has been added and it has been authorized."  Is it the same authorization number?  "Yes, it is the same number, [id number]."

## 2019-03-30 DIAGNOSIS — M7752 Other enthesopathy of left foot: Secondary | ICD-10-CM | POA: Diagnosis not present

## 2019-03-30 DIAGNOSIS — Z4889 Encounter for other specified surgical aftercare: Secondary | ICD-10-CM | POA: Diagnosis not present

## 2019-03-30 NOTE — Telephone Encounter (Signed)
AUTHORIZATION # [id number], SERVICE DATES April 21, 2019 - 19-May-2019 FOR CPT CODES 71595 AND 39672

## 2019-04-05 ENCOUNTER — Ambulatory Visit (INDEPENDENT_AMBULATORY_CARE_PROVIDER_SITE_OTHER): Payer: No Typology Code available for payment source

## 2019-04-05 ENCOUNTER — Ambulatory Visit (INDEPENDENT_AMBULATORY_CARE_PROVIDER_SITE_OTHER): Payer: No Typology Code available for payment source | Admitting: Podiatry

## 2019-04-05 ENCOUNTER — Other Ambulatory Visit: Payer: Self-pay

## 2019-04-05 VITALS — Temp 97.9°F

## 2019-04-05 DIAGNOSIS — Z9889 Other specified postprocedural states: Secondary | ICD-10-CM

## 2019-04-05 DIAGNOSIS — M2012 Hallux valgus (acquired), left foot: Secondary | ICD-10-CM | POA: Diagnosis not present

## 2019-04-05 NOTE — Progress Notes (Signed)
She presents today for postop visit date of surgery 03/30/2019 removal painful fixation and release of the metatarsal phalangeal joint.  She states my foot feels fine he just has a tight feeling over the incision.  Objective: Vital signs are stable she is alert and oriented x3 presents in her Cam walker today once removed demonstrates dry sterile dressing intact once removed demonstrates severely edematous forefoot left.  She has good range of motion dorsiflexion and plantarflexion.  Sutures are intact margins are well coapted at this point.  Radiographs confirm complete removal of internal fixation.  Assessment: Well-healing surgical foot moderate edema postoperatively however.  Plan: Encouraged her to continue ice and elevation and staying off the foot and encourage range of motion.  I will follow-up with her in 1 week at which time hopefully we will get the sutures out and get her into a [place] shoe.

## 2019-04-12 ENCOUNTER — Other Ambulatory Visit: Payer: Self-pay

## 2019-04-12 ENCOUNTER — Ambulatory Visit (INDEPENDENT_AMBULATORY_CARE_PROVIDER_SITE_OTHER): Payer: No Typology Code available for payment source | Admitting: Podiatry

## 2019-04-12 ENCOUNTER — Encounter: Payer: Self-pay | Admitting: Podiatry

## 2019-04-12 DIAGNOSIS — Z9889 Other specified postprocedural states: Secondary | ICD-10-CM

## 2019-04-12 NOTE — Progress Notes (Signed)
She presents today for follow-up of her removal of painful internal fixation metatarsal 1 left and release of the first metatarsal phalangeal joint.  She states that left foot is feeling fine.  Objective: Vital signs are stable she is alert oriented x3 there is mild edema about the first and metatarsal space distally she is great range of motion on plantarflexion and little bit limited on dorsiflexion but this should come along.  Sutures are intact margins well coapted.  Assessment: Well-healing surgical foot left.  Plan: Remove sutures today put her in a compression anklet and regular shoes have her follow-up with me in 1 month.

## 2019-05-01 ENCOUNTER — Other Ambulatory Visit: Payer: Self-pay

## 2019-05-01 ENCOUNTER — Encounter: Payer: Self-pay | Admitting: Podiatry

## 2019-05-01 ENCOUNTER — Ambulatory Visit (INDEPENDENT_AMBULATORY_CARE_PROVIDER_SITE_OTHER): Payer: No Typology Code available for payment source | Admitting: Podiatry

## 2019-05-01 ENCOUNTER — Telehealth: Payer: Self-pay | Admitting: *Deleted

## 2019-05-01 ENCOUNTER — Ambulatory Visit (INDEPENDENT_AMBULATORY_CARE_PROVIDER_SITE_OTHER): Payer: No Typology Code available for payment source

## 2019-05-01 DIAGNOSIS — Z9889 Other specified postprocedural states: Secondary | ICD-10-CM

## 2019-05-01 DIAGNOSIS — M2012 Hallux valgus (acquired), left foot: Secondary | ICD-10-CM

## 2019-05-01 NOTE — Progress Notes (Signed)
She presents today date of surgery 03/30/2019 removal painful fixation first metatarsal with capsulotomy and release of the joint manipulation under anesthesia left first MTPJ.  States my foot is sore and stiff in the big toe joint.  Objective: Vital signs are stable alert and oriented x3.  Pulses are palpable.  She has good plantar flexion with stiff dorsiflexion was likely associated with scar tissue.  Assessment: Well-healing surgical foot limited dorsiflexion first metatarsophalangeal joint.  Plan: At this point we will send her to physical therapy and regain some motion of that big toe joint.

## 2019-05-01 NOTE — Telephone Encounter (Signed)
Faxed to [facility].

## 2019-05-01 NOTE — Telephone Encounter (Signed)
-----   Message from Rip Harbour, [facility] sent at 05/01/2019 10:27 AM EDT ----- Regarding: PT referral PT - [facility] in [place] on [address]   S/p removal painful hardware left foot - increase ROM 1st MPJ left   Duration: 3 x week x 4 weeks

## 2019-05-10 ENCOUNTER — Other Ambulatory Visit: Payer: Self-pay

## 2019-05-10 ENCOUNTER — Ambulatory Visit: Payer: No Typology Code available for payment source | Attending: Podiatry | Admitting: Physical Therapy

## 2019-05-10 ENCOUNTER — Encounter: Payer: Self-pay | Admitting: Physical Therapy

## 2019-05-10 DIAGNOSIS — M25572 Pain in left ankle and joints of left foot: Secondary | ICD-10-CM | POA: Insufficient documentation

## 2019-05-10 DIAGNOSIS — R2689 Other abnormalities of gait and mobility: Secondary | ICD-10-CM | POA: Insufficient documentation

## 2019-05-10 DIAGNOSIS — M25675 Stiffness of left foot, not elsewhere classified: Secondary | ICD-10-CM | POA: Diagnosis present

## 2019-05-10 DIAGNOSIS — M79675 Pain in left toe(s): Secondary | ICD-10-CM | POA: Insufficient documentation

## 2019-05-10 NOTE — Therapy (Addendum)
[facility] PHYS[facility] MEDICINE [address] [place], [address] Phone: [phone]   Fax:  [phone]  Physical Therapy Evaluation  Patient Details  Name: DEVONN SUGAWARA MRN: [id number] Date of Birth: 1976-04-01 Referring Provider (PT): [place], [place], [place]   Encounter Date: 05/10/2019    Past Medical History:  Diagnosis Date   Allergy    Anemia, iron deficiency    G6PD (glucose 6 phosphatase deficiency)    G6PD deficiency     History reviewed. No pertinent surgical history.  There were no vitals filed for this visit.         Objective measurements completed on examination: See above findings.    Therapeutic Exercise to improve her tolerance, reduce scar adhesions, and improve overall ROM.  Left foot scar massage - 5 min, 1-2x/day Left 1st MCP joint mobilizations (PA/AP) grades 1-2 - 5 second bouts totaling 1-2 minutes per day Left dynamic PROM (standing or half-kneeling) 2x10  Pt reported no exacerbation of sx or increase in pain.           PT Short Term Goals - 05/10/19 1844      PT SHORT TERM GOAL #1   Title  Pt will be independent and compliant with her HEP.    Time  2    Period  Weeks    Status  New    Target Date  05/24/19        PT Long Term Goals - 05/10/19 1844      PT LONG TERM GOAL #1   Title  Pt will score a 80/80 on the LEFS to demonstrate an improvement in disability and function.    Baseline  73/80    Time  6    Period  Weeks    Status  New    Target Date  06/21/19      PT LONG TERM GOAL #2   Title  Pt will be able to ambulate greater than 2 miles with 0/10 pain exacerbation to demonstrate improved function and quality of life.    Baseline  1/10    Time  6    Period  Weeks    Status  New    Target Date  06/21/19      PT LONG TERM GOAL #3   Title  Pt will demonstrate equal to or less than 10% difference in passive great toe extension side-to-side to demonstrate  significantly improved ROM.    Baseline  20 degrees left, 50 degrees right    Time  6    Period  Weeks    Status  New    Target Date  06/21/19               Patient will benefit from skilled therapeutic intervention in order to improve the following deficits and impairments:  Abnormal gait, Decreased range of motion, Difficulty walking, Decreased activity tolerance, Decreased balance, Decreased strength, Decreased mobility, Improper body mechanics, Hypomobility, Decreased scar mobility  Visit Diagnosis: 1. Pain in left toe(s)   2. Pain in left ankle and joints of left foot        Problem List Patient Active Problem List   Diagnosis Date Noted   Left foot pain 02/20/2019   Bilateral bunions 09/19/2017   Alopecia 09/20/2013   Menstrual cramps 11/11/2011   History of miscarriage 02/04/2011   ANEMIA-IRON DEFICIENCY 08/24/2010   OTHER SPECIFIED ANEMIAS 08/24/2010   Essential hypertension 08/24/2010   ALLERGIC RHINITIS 08/24/2010  Staci Acosta PT, DPT Staci Acosta, SPT 05/17/2019, 10:32 AM  [place] [facility] PHYSICAL AND SPORTS MEDICINE [address], [place], [address] Phone: [phone]   Fax:  [phone]  Name: CORAH WURZEL MRN: [id number] Date of Birth: 1975-11-06

## 2019-05-11 ENCOUNTER — Encounter: Payer: Self-pay | Admitting: Physical Therapy

## 2019-05-14 ENCOUNTER — Encounter: Payer: No Typology Code available for payment source | Admitting: Physical Therapy

## 2019-05-15 ENCOUNTER — Ambulatory Visit: Payer: No Typology Code available for payment source

## 2019-05-15 ENCOUNTER — Other Ambulatory Visit: Payer: Self-pay

## 2019-05-15 ENCOUNTER — Other Ambulatory Visit: Payer: No Typology Code available for payment source

## 2019-05-15 DIAGNOSIS — R2689 Other abnormalities of gait and mobility: Secondary | ICD-10-CM

## 2019-05-15 DIAGNOSIS — M25572 Pain in left ankle and joints of left foot: Secondary | ICD-10-CM

## 2019-05-15 DIAGNOSIS — M79675 Pain in left toe(s): Secondary | ICD-10-CM | POA: Diagnosis not present

## 2019-05-15 DIAGNOSIS — M25675 Stiffness of left foot, not elsewhere classified: Secondary | ICD-10-CM

## 2019-05-15 NOTE — Therapy (Signed)
[facility] [address] [place], [place], [address] Phone: [phone]   Fax:  [phone]  Physical Therapy Treatment  Patient Details  Name: Jodi RegalMarcel A Ross MRN: [id number] Date of Birth: 12/25/1975 Referring Provider (PT): [place], [place], [place]   Encounter Date: 05/15/2019  PT End of Session - 05/15/19 1542    Visit Number  2    Number of Visits  13    Date for PT Re-Evaluation  06/21/19    PT Start Time  1521   Pt arrived late   PT Stop Time  1559    PT Time Calculation (min)  38 min    Activity Tolerance  Patient tolerated treatment well;No increased pain    Behavior During Therapy  WFL for tasks assessed/performed       Past Medical History:  Diagnosis Date  . Allergy   . Anemia, iron deficiency   . G6PD (glucose 6 phosphatase deficiency)   . G6PD deficiency     No past surgical history on file.  There were no vitals filed for this visit.  Subjective Assessment - 05/15/19 1540    Subjective  Pt reports no pain, has been working on HEP, but not sure if she is mobilizing the joint correctly. Pt has been walking a fair amount.    Pertinent History  Pt is a 43 y/o F who presents to PT with a primary medical dx of hallux valgus of the left foot s/p removal of fixator. Pt's main complaint is stiffness and lack of ROM in her left great toe. Pt's worst pain was 1/10 when walking greater than 2 miles or walking for prolonged time without her shoes and a current 0/10 pain. Pt did not state any other aggravating factors than walking greater than 2 miles. Pt's pertinent PMH includes controlled HTN, as her pertinent surgical hx includes a L bunionectomy that occurred last year, which she also reporting having PT for afterwards. 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 Pt has 2-3 STE and right handrail for her home entrance. No reported falls within the  last 6 months. Pt's hobbies include waking with her husband for 4-6 miles, to which she has not been able to do lately 2/2 to pain. Pt's goals are to get full motion in her left great toe.    Currently in Pain?  No/denies      INTERVENTION THIS DATE   Manual Therapy: -first ray plantar glide of 1st phalange on metatarsal 3x30sec in open chain, 3x30sec in closed chair, heel elevated.  -first ray toe pull 3x60sec -Scar massage x 5minutes - Ankle posterior glides 3x30sec Grade IV   Therex:  -gross toe flexion 15x3secH, seated -seated heel raises on Left with 1st MTP contact 15x3secH -standing heel raises  Calf streching wall push 3x30sec bilat          PT Short Term Goals - 05/10/19 1844      PT SHORT TERM GOAL #1   Title  Pt will be independent and compliant with her HEP.    Time  2    Period  Weeks    Status  New    Target Date  05/24/19        PT Long Term Goals - 05/10/19 1844      PT LONG TERM GOAL #1   Title  Pt will score a 80/80  on the LEFS to demonstrate an improvement in disability and function.    Baseline  73/80    Time  6    Period  Weeks    Status  New    Target Date  06/21/19      PT LONG TERM GOAL #2   Title  Pt will be able to ambulate greater than 2 miles with 0/10 pain exacerbation to demonstrate improved function and quality of life.    Baseline  1/10    Time  6    Period  Weeks    Status  New    Target Date  06/21/19      PT LONG TERM GOAL #3   Title  Pt will demonstrate equal to or less than 10% difference in passive great toe extension side-to-side to demonstrate significantly improved ROM.    Baseline  20 degrees left, 50 degrees right    Time  6    Period  Weeks    Status  New    Target Date  06/21/19            Plan - 05/15/19 1552    Clinical Impression Statement  Reviewed HEP. Heavy manual therapy to the dorsal first ray soft tissue structures. Stretching of hallux, and plantar glides of phalange on metatarsal. Pt has no  pain to speak of. Toe remains rigid with both capsular and plantarfascial restirction to movement.    Rehab Potential  Good    PT Frequency  2x / week    PT Duration  6 weeks    PT Treatment/Interventions  ADLs/Self Care Home Management;Moist Heat;Gait training;Stair training;Functional mobility training;Patient/family education;Therapeutic activities;Therapeutic exercise;Balance training;Neuromuscular re-education;Manual techniques;Scar mobilization;Passive range of motion;Joint Manipulations    PT Next Visit Plan  continued with manual therapy as tolerated.    PT Home Exercise Plan  no updates this session.    Consulted and Agree with Plan of Care  Patient       Patient will benefit from skilled therapeutic intervention in order to improve the following deficits and impairments:  Abnormal gait, Decreased range of motion, Difficulty walking, Decreased activity tolerance, Decreased balance, Decreased strength, Decreased mobility, Improper body mechanics, Hypomobility, Decreased scar mobility  Visit Diagnosis: 1. Pain in left toe(s)   2. Pain in left ankle and joints of left foot   3. Stiffness of left foot, not elsewhere classified   4. Other abnormalities of gait and mobility        Problem List Patient Active Problem List   Diagnosis Date Noted  . Left foot pain 02/20/2019  . 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 Alopecia 09/20/2013  . Menstrual cramps 11/11/2011  . History of miscarriage 02/04/2011  . ANEMIA-IRON DEFICIENCY 08/24/2010  . OTHER SPECIFIED ANEMIAS 08/24/2010  . Essential hypertension 08/24/2010  . ALLERGIC RHINITIS 08/24/2010   4:01 PM, 05/15/19 Etta Grandchild, PT, DPT Physical Therapist - [place] [phone] (Office)    Cliffard Hair C 05/15/2019, 4:00 PM  [place] PHYSICAL AND SPORTS MEDICINE [address], [place], [address] Phone: [phone]   Fax:  [phone]  Name: Jodi Ross MRN:  [id number] Date of Birth: 23-Apr-1976

## 2019-05-16 ENCOUNTER — Ambulatory Visit: Payer: No Typology Code available for payment source

## 2019-05-16 DIAGNOSIS — M79675 Pain in left toe(s): Secondary | ICD-10-CM | POA: Diagnosis not present

## 2019-05-16 DIAGNOSIS — M25675 Stiffness of left foot, not elsewhere classified: Secondary | ICD-10-CM

## 2019-05-16 DIAGNOSIS — M25572 Pain in left ankle and joints of left foot: Secondary | ICD-10-CM

## 2019-05-16 DIAGNOSIS — R2689 Other abnormalities of gait and mobility: Secondary | ICD-10-CM

## 2019-05-16 NOTE — Therapy (Signed)
ALLERGIC RHINITIS 08/24/2010   8:27 AM, 05/16/19 Etta Grandchild, PT, DPT Physical Therapist - [place] [phone] (Office)    Buccola,Allan C 05/16/2019, 8:20 AM  [place] PHYSICAL AND SPORTS MEDICINE [address], [place], [address] Phone: [phone]   Fax:  [phone]  Name: CIPRIANA BILLER MRN: [id number] Date of Birth: 12-04-1975  [facility] PHYSICAL [facility] SP[facility]TS MEDICINE [address] [place], [place], [address] Phone: [phone]   Fax:  [phone]  Physical Therapy Treatment  Patient Details  Name: Marval RegalMarcel A Huebsch MRN: [id number] Date of Birth: 07/14/1976 Referring Provider (PT): [place], [place], [place]   Encounter Date: 05/16/2019  PT End of Session - 05/16/19 0809    Visit Number  3    Number of Visits  13    Date for PT Re-Evaluation  06/21/19    PT Start Time  0731    PT Stop Time  0811    PT Time Calculation (min)  40 min    Activity Tolerance  Patient tolerated treatment well;No increased pain    Behavior During Therapy  WFL for tasks assessed/performed       Past Medical History:  Diagnosis Date  . Allergy   . Anemia, iron deficiency   . G6PD (glucose 6 phosphatase deficiency)   . G6PD deficiency     No past surgical history on file.  There were no vitals filed for this visit.  Subjective Assessment - 05/16/19 0806    Subjective  Pt was here yesterday for PT treatment, no pain issues yesterday. Pt feeling good today in general.    Pertinent History  Pt is a 43 y/o F who presents to PT with a primary medical dx of hallux valgus of the left foot s/p removal of fixator. Pt's main complaint is stiffness and lack of ROM in her left great toe. Pt's worst pain was 1/10 when walking greater than 2 miles or walking for prolonged time without her shoes and a current 0/10 pain. Pt did not state any other aggravating factors than walking greater than 2 miles. Pt's pertinent PMH includes controlled HTN, as her pertinent surgical hx includes a L bunionectomy that occurred last year, which she also reporting having PT for afterwards. 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 Pt has 2-3 STE and right handrail for her home entrance. No reported falls within the last 6 months. Pt's hobbies include waking with her  husband for 4-6 miles, to which she has not been able to do lately 2/2 to pain. Pt's goals are to get full motion in her left great toe.    Currently in Pain?  No/denies       INTERVENTION THIS DATE:  Therapeutic Ultrasound -3.3 MHz, 2.2 W/cm^2, 5 minutes to dorsal and dorsomedial MTP capsule; 5 minutes to plantar MTP capsule and distal plantarfascia along the 1st and 2nd ray.  *some manual therapies worked in between US treatments  Manual Therapy -Left hallux long axis distraction 3x60sec -Left hallux dorsal glide phalange on metatarsal 3x30sec Grade 4 -hallux extension stretch with ART to plantarfascia and plantar capsule 10x10sec  Therapeutic Exercise -Heel hang PF/calf stretch on stairs in SLS 3x30sec bilat -bilat heel raises 2x20s, wide-ish stance with emphasis on pronation and full MTP contact  -bilat ankle dorsiflexion 2x15, heels on half foam roll, forefoot on floor  -SLS 3x30sec bilat (to engage foot intrinsic muscles)      PT Short Term Goals - 05/10/19 1844      PT SHORT TERM GOAL #1   Title  Pt will be independent and compliant with her HEP.    Time  2    Period  Weeks    Status  New    Target Date  05/24/19  ALLERGIC RHINITIS 08/24/2010   8:27 AM, 05/16/19 Etta Grandchild, PT, DPT Physical Therapist - [place] [phone] (Office)    Buccola,Allan C 05/16/2019, 8:20 AM  [place] [facility] [address], [place], [address] Phone: [phone]   Fax:  [phone]  Name: CIPRIANA BILLER MRN: [id number] Date of Birth: 12-04-1975

## 2019-05-18 ENCOUNTER — Encounter: Payer: Self-pay | Admitting: Podiatry

## 2019-05-21 ENCOUNTER — Other Ambulatory Visit: Payer: Self-pay

## 2019-05-21 ENCOUNTER — Encounter: Payer: Self-pay | Admitting: Physical Therapy

## 2019-05-21 ENCOUNTER — Ambulatory Visit: Payer: No Typology Code available for payment source | Attending: Podiatry | Admitting: Physical Therapy

## 2019-05-21 DIAGNOSIS — M25675 Stiffness of left foot, not elsewhere classified: Secondary | ICD-10-CM | POA: Insufficient documentation

## 2019-05-21 DIAGNOSIS — R2689 Other abnormalities of gait and mobility: Secondary | ICD-10-CM | POA: Insufficient documentation

## 2019-05-21 DIAGNOSIS — M25572 Pain in left ankle and joints of left foot: Secondary | ICD-10-CM | POA: Insufficient documentation

## 2019-05-21 DIAGNOSIS — M79675 Pain in left toe(s): Secondary | ICD-10-CM | POA: Diagnosis not present

## 2019-05-21 NOTE — Therapy (Signed)
deficits and impairments:  Abnormal gait, Decreased range of motion, Difficulty walking, Decreased activity tolerance, Decreased balance, Decreased strength, Decreased mobility, Improper body mechanics, Hypomobility, Decreased scar mobility  Visit Diagnosis: 1. Pain in left toe(s)   2. Pain in left ankle and joints of left foot   3. Stiffness of left foot, not elsewhere classified   4. Other abnormalities of gait and mobility        Problem List Patient Active Problem List   Diagnosis Date Noted  . Left foot pain 02/20/2019  . 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 Alopecia 09/20/2013  . Menstrual cramps 11/11/2011  . History of miscarriage 02/04/2011  . ANEMIA-IRON DEFICIENCY 08/24/2010  . OTHER SPECIFIED ANEMIAS 08/24/2010  . Essential hypertension 08/24/2010  . ALLERGIC RHINITIS 08/24/2010   Staci Acostahelsea Miller PT, DPT Staci Acostahelsea Miller 05/21/2019, 5:25 PM  [facility]IC[facility] [address] [place], [place], [address] Phone: [phone]   Fax:  [phone]  Name: Marval RegalMarcel A Puchalski MRN: [id number] Date of Birth: 01/07/1976  [place] PHYSICAL AND SPORTS MEDICINE [address] [place], [address] Phone: [phone]   Fax:  [phone]  Physical Therapy Treatment  Patient Details  Name: Jodi Ross MRN: [id number] Date of Birth: October 07, 1976 Referring Provider (PT): [place], [place], [place]   Encounter Date: 05/21/2019  PT End of Session - 05/21/19 1718    Visit Number  4    Number of Visits  13    Date for PT Re-Evaluation  06/21/19    PT Start Time  0315    PT Stop Time  0400    PT Time Calculation (min)  45 min    Activity Tolerance  Patient tolerated treatment well;No increased pain    Behavior During Therapy  WFL for tasks assessed/performed       Past Medical History:  Diagnosis Date  . Allergy   . Anemia, iron deficiency   . G6PD (glucose 6 phosphatase deficiency)   . G6PD deficiency     History reviewed. No pertinent surgical history.  There were no vitals filed for this visit.  Subjective Assessment - 05/21/19 1518    Subjective  Pt continues to report no pain, thinks motion is improving.    Pertinent History  Pt is a 43 y/o F who presents to PT with a primary medical dx of hallux valgus of the left foot s/p removal of fixator. Pt's main complaint is stiffness and lack of ROM in her left great toe. Pt's worst pain was 1/10 when walking greater than 2 miles or walking for prolonged time without her shoes and a current 0/10 pain. Pt did not state any other aggravating factors than walking greater than 2 miles. Pt's pertinent PMH includes controlled HTN, as her pertinent surgical hx includes a L bunionectomy that occurred last year, which she also reporting having PT for afterwards. 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 Pt has 2-3 STE and right handrail for her home entrance. No reported falls within the last 6 months. Pt's hobbies include waking with her husband for 4-6 miles, to  which she has not been able to do lately 2/2 to pain. Pt's goals are to get full motion in her left great toe.    Limitations  Walking    How long can you sit comfortably?  n/a    How long can you stand comfortably?  n/a    How long can you walk comfortably?  ~2 miles before onset of pain    Diagnostic tests  see chart for imaging results    Patient Stated Goals  To attain full left great toe ROM    Multiple Pain Sites  No        Therapeutic Ultrasound -3.3 MHz, 2.2 W/cm^2, 5 minutes to dorsal and dorsomedial MTP capsule; 5 minutes to plantar MTP capsule and distal plantarfascia along the 1st and 2nd ray.  *some manual therapies worked in between [place] treatments  Manual Therapy -Left hallux long axis distraction 3x60sec -Left hallux dorsal glide phalange on metatarsal 3x30sec Grade 4 -hallux extension stretch with ART to plantarfascia and plantar capsule 10x10sec  Therapeutic Exercise - Seated heel raise against therapist resistance for concentric cand eccentric control  - Standing heel raise x10; off step 2x 10 with cuing for eccentric lowering -SLS on foam pad 3x30sec bilat (to engage foot intrinsic muscles)  - Lunge 3x 8 bilat with emphasis on  deficits and impairments:  Abnormal gait, Decreased range of motion, Difficulty walking, Decreased activity tolerance, Decreased balance, Decreased strength, Decreased mobility, Improper body mechanics, Hypomobility, Decreased scar mobility  Visit Diagnosis: 1. Pain in left toe(s)   2. Pain in left ankle and joints of left foot   3. Stiffness of left foot, not elsewhere classified   4. Other abnormalities of gait and mobility        Problem List Patient Active Problem List   Diagnosis Date Noted  . Left foot pain 02/20/2019  . 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 Alopecia 09/20/2013  . Menstrual cramps 11/11/2011  . History of miscarriage 02/04/2011  . ANEMIA-IRON DEFICIENCY 08/24/2010  . OTHER SPECIFIED ANEMIAS 08/24/2010  . Essential hypertension 08/24/2010  . ALLERGIC RHINITIS 08/24/2010   Staci Acostahelsea Miller PT, DPT Staci Acostahelsea Miller 05/21/2019, 5:25 PM  [place] [facility] PH[facility]IC[facility] [address] [place], [place], [address] Phone: [phone]   Fax:  [phone]  Name: Marval RegalMarcel A Puchalski MRN: [id number] Date of Birth: 01/07/1976

## 2019-05-23 ENCOUNTER — Other Ambulatory Visit: Payer: Self-pay

## 2019-05-23 ENCOUNTER — Ambulatory Visit: Payer: No Typology Code available for payment source | Admitting: Physical Therapy

## 2019-05-23 ENCOUNTER — Encounter: Payer: Self-pay | Admitting: Physical Therapy

## 2019-05-23 DIAGNOSIS — M79675 Pain in left toe(s): Secondary | ICD-10-CM

## 2019-05-23 DIAGNOSIS — M25675 Stiffness of left foot, not elsewhere classified: Secondary | ICD-10-CM

## 2019-05-23 DIAGNOSIS — M25572 Pain in left ankle and joints of left foot: Secondary | ICD-10-CM

## 2019-05-23 NOTE — Therapy (Signed)
[facility] [facility] [address], [place], [address] Phone: [phone]   Fax:  [phone]  Physical Therapy Treatment  Patient Details  Name: Jodi Ross MRN: [id number] Date of Birth: [birth year] Referring Provider (PT): [place], [place] T, [place]   Encounter Date: 05/23/2019  PT End of Session - 05/23/19 1558    Visit Number  5    Number of Visits  13    Date for PT Re-Evaluation  06/21/19    PT Start Time  0315    PT Stop Time  0400    PT Time Calculation (min)  45 min    Activity Tolerance  Patient tolerated treatment well;No increased pain    Behavior During Therapy  WFL for tasks assessed/performed       Past Medical History:  Diagnosis Date  . Allergy   . Anemia, iron deficiency   . G6PD (glucose 6 phosphatase deficiency)   . G6PD deficiency     History reviewed. No pertinent surgical history.  There were no vitals filed for this visit.  Subjective Assessment - 05/23/19 1553    Subjective  Pt continues to report no pain, minimal HEP compliance yesterday d/t working yesterday.    Pertinent History  Pt is a 43 y/o F who presents to PT with a primary medical dx of hallux valgus of the left foot s/p removal of fixator. Pt's main complaint is stiffness and lack of ROM in her left great toe. Pt's worst pain was 1/10 when walking greater than 2 miles or walking for prolonged time without her shoes and a current 0/10 pain. Pt did not state any other aggravating factors than walking greater than 2 miles. Pt's pertinent PMH includes controlled HTN, as her pertinent surgical hx includes a L bunionectomy that occurred last year, which she also reporting having PT for afterwards. 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 Pt has 2-3 STE and right handrail for her home entrance. No reported falls within the last 6 months. Pt's hobbies include waking with her  husband for 4-6 miles, to which she has not been able to do lately 2/2 to pain. Pt's goals are to get full motion in her left great toe.    Limitations  Walking    How long can you sit comfortably?  n/a    How long can you stand comfortably?  n/a    How long can you walk comfortably?  ~2 miles before onset of pain    Diagnostic tests  see chart for imaging results    Patient Stated Goals  To attain full left great toe ROM       Therapeutic Ultrasound -3.3 MHz, 2.2 W/cm^2, 5 minutes to dorsal and dorsomedial MTP capsule; 5 minutes to plantar MTP capsule and distal plantarfascia along the 1st and 2nd ray.  *some manual therapies worked in between [place] treatments  Manual Therapy -Left hallux long axis distraction 3x60sec -Left hallux dorsal glide phalange on metatarsal 3x30sec Grade 4 -hallux extension stretch with ART to plantarfascia and plantar capsule 10x10sec  Therapeutic Exercise - Seated heel raise against therapist resistance for concentric cand eccentric control  - Baps board L3 AND L4 x10 clockwise and counterclockwise - Bosu lateral shifting x10; forward/back shifting x10  - Bosu squat 3x 10 with max cuing initially for proper form with good carry over following - Standing heel raise  on bosu ball 3 x10; with cuing to maintain met head contact contact - Standing on foam 2x 10sec; with alt nose touches 2x 10sec                         PT Education - 05/23/19 1556    Education Details  therex form, [place]    Person(s) Educated  Patient    Methods  Explanation;Demonstration;Verbal cues    Comprehension  Verbalized understanding;Returned demonstration;Verbal cues required       PT Short Term Goals - 05/10/19 1844      PT SHORT TERM GOAL #1   Title  Pt will be independent and compliant with her HEP.    Time  2    Period  Weeks    Status  New    Target Date  05/24/19        PT Long Term Goals - 05/10/19 1844      PT LONG TERM GOAL #1   Title  Pt  will score a 80/80 on the LEFS to demonstrate an improvement in disability and function.    Baseline  73/80    Time  6    Period  Weeks    Status  New    Target Date  06/21/19      PT LONG TERM GOAL #2   Title  Pt will be able to ambulate greater than 2 miles with 0/10 pain exacerbation to demonstrate improved function and quality of life.    Baseline  1/10    Time  6    Period  Weeks    Status  New    Target Date  06/21/19      PT LONG TERM GOAL #3   Title  Pt will demonstrate equal to or less than 10% difference in passive great toe extension side-to-side to demonstrate significantly improved ROM.    Baseline  20 degrees left, 50 degrees right    Time  6    Period  Weeks    Status  New    Target Date  06/21/19            Plan - 05/23/19 1603    Clinical Impression Statement  Pt continues to respond well to [place] treatment with increased motion following, and subjective report of "less stiffness". PT continued therex progression for strenghteing and balance with foot intrinsic activation, with good carry over following demo and cuing.    Personal Factors and Comorbidities  Comorbidity 1;Past/Current Experience    Comorbidities  HTN, L bunionectomy    Examination-Activity Limitations  Locomotion Level;Squat    Stability/Clinical Decision Making  Stable/Uncomplicated    Clinical Decision Making  Moderate    Rehab Potential  Fair    PT Frequency  2x / week    PT Duration  6 weeks    PT Treatment/Interventions  ADLs/Self Care Home Management;Moist Heat;Gait training;Stair training;Functional mobility training;Patient/family education;Therapeutic activities;Therapeutic exercise;Balance training;Neuromuscular re-education;Manual techniques;Scar mobilization;Passive range of motion;Joint Manipulations    PT Next Visit Plan  MTP joint mobilization, therapeutic [place] to MTP capsule and plantar fascia prior to stretching; foot intrinsics, ankle DF ROM and strength    PT Home Exercise Plan   No updates this date; conside adding in more SLS or other foot intrinsics in the future.    Consulted and Agree with Plan of Care  Patient       Patient will benefit from skilled therapeutic intervention in order to improve the following  deficits and impairments:  Abnormal gait, Decreased range of motion, Difficulty walking, Decreased activity tolerance, Decreased balance, Decreased strength, Decreased mobility, Improper body mechanics, Hypomobility, Decreased scar mobility  Visit Diagnosis: 1. Pain in left toe(s)   2. Pain in left ankle and joints of left foot   3. Stiffness of left foot, not elsewhere classified        Problem List Patient Active Problem List   Diagnosis Date Noted  . Left foot pain 02/20/2019  . 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 Alopecia 09/20/2013  . Menstrual cramps 11/11/2011  . History of miscarriage 02/04/2011  . ANEMIA-IRON DEFICIENCY 08/24/2010  . OTHER SPECIFIED ANEMIAS 08/24/2010  . Essential hypertension 08/24/2010  . ALLERGIC RHINITIS 08/24/2010   Staci Acosta PT, DPT Staci Acosta 05/23/2019, 4:05 PM  [facility] PH[facility] SPORTS MEDICINE [address] [place], [address] Phone: [phone]   Fax:  [phone]  Name: Jodi Ross MRN: [id number] Date of Birth: Feb 26, 1976

## 2019-05-29 ENCOUNTER — Encounter: Payer: Self-pay | Admitting: Physical Therapy

## 2019-05-29 ENCOUNTER — Other Ambulatory Visit: Payer: Self-pay

## 2019-05-29 ENCOUNTER — Ambulatory Visit: Payer: No Typology Code available for payment source | Admitting: Physical Therapy

## 2019-05-29 DIAGNOSIS — M79675 Pain in left toe(s): Secondary | ICD-10-CM

## 2019-05-29 DIAGNOSIS — R2689 Other abnormalities of gait and mobility: Secondary | ICD-10-CM

## 2019-05-29 DIAGNOSIS — M25572 Pain in left ankle and joints of left foot: Secondary | ICD-10-CM

## 2019-05-29 DIAGNOSIS — M25675 Stiffness of left foot, not elsewhere classified: Secondary | ICD-10-CM

## 2019-05-29 NOTE — Therapy (Signed)
[place] [facility] [address], [place], [address] Phone: [phone]   Fax:  [phone]  Physical Therapy Treatment  Patient Details  Name: Jodi Ross MRN: [id number] Date of Birth: [birth year] Referring Provider (PT): [place], [place] T, [place]   Encounter Date: 05/29/2019  PT End of Session - 05/29/19 1705    Visit Number  6    Number of Visits  13    Date for PT Re-Evaluation  06/21/19    PT Start Time  0145    PT Stop Time  0230    PT Time Calculation (min)  45 min    Activity Tolerance  Patient tolerated treatment well;No increased pain    Behavior During Therapy  WFL for tasks assessed/performed       Past Medical History:  Diagnosis Date  . Allergy   . Anemia, iron deficiency   . G6PD (glucose 6 phosphatase deficiency)   . G6PD deficiency     History reviewed. No pertinent surgical history.  There were no vitals filed for this visit.  Subjective Assessment - 05/29/19 1348    Subjective  No pain, reports she was able to complete lunges which she is happy with.    Pertinent History  Pt is a 43 y/o F who presents to PT with a primary medical dx of hallux valgus of the left foot s/p removal of fixator. Pt's main complaint is stiffness and lack of ROM in her left great toe. Pt's worst pain was 1/10 when walking greater than 2 miles or walking for prolonged time without her shoes and a current 0/10 pain. Pt did not state any other aggravating factors than walking greater than 2 miles. Pt's pertinent PMH includes controlled HTN, as her pertinent surgical hx includes a L bunionectomy that occurred last year, which she also reporting having PT for afterwards. 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 Pt has 2-3 STE and right handrail for her home entrance. No reported falls within the last 6 months. Pt's hobbies include waking with her husband for  4-6 miles, to which she has not been able to do lately 2/2 to pain. Pt's goals are to get full motion in her left great toe.    Limitations  Walking    How long can you sit comfortably?  n/a    How long can you stand comfortably?  n/a    How long can you walk comfortably?  ~2 miles before onset of pain    Diagnostic tests  see chart for imaging results    Patient Stated Goals  To attain full left great toe ROM    Currently in Pain?  No/denies      Therapeutic Ultrasound 3.3 MHz, 2.2 W/cm^2, 5 minutes to dorsal and dorsomedial MTP capsule; 5 minutes to plantar MTP capsule and distal plantarfascia along the 1st and 2nd ray.  *some manual therapies worked in between [place] treatments  Manual Therapy -Left hallux long axis distraction 3x60sec -Left hallux dorsal glide phalange on metatarsal 3x30sec Grade 4 -hallux extension stretch with ART to plantarfascia and plantar capsule 10x10sec  Therapeutic Exercise -Seated heel raise against therapist resistance for concentric cand eccentric control 2x 10 - Standing on foam ball toss 2x 10 tossing ball outside BOS for increased wt shifting - SL heel raise 2x 10  - Lunge 2x 10 each side with emphasis on  DF when LLE is front and GT ext when LLE is back - LLE forward step down 2x 10 with cuing initially for proper hip/knee/ankle alignment and to maintain heel contact, good carry over following  Education on inserts for medial arch support and demonstration on how this helps knee alignment in wt bearing (patient is demonstrating midfoot pronation with knee valgus bilat, with associated R knee pain)                          PT Education - 05/29/19 1705    Education Details  therex form    Person(s) Educated  Patient    Methods  Explanation;Demonstration;Verbal cues;Tactile cues    Comprehension  Verbalized understanding;Returned demonstration;Verbal cues required;Tactile cues required       PT Short Term Goals - 05/10/19 1844       PT SHORT TERM GOAL #1   Title  Pt will be independent and compliant with her HEP.    Time  2    Period  Weeks    Status  New    Target Date  05/24/19        PT Long Term Goals - 05/10/19 1844      PT LONG TERM GOAL #1   Title  Pt will score a 80/80 on the LEFS to demonstrate an improvement in disability and function.    Baseline  73/80    Time  6    Period  Weeks    Status  New    Target Date  06/21/19      PT LONG TERM GOAL #2   Title  Pt will be able to ambulate greater than 2 miles with 0/10 pain exacerbation to demonstrate improved function and quality of life.    Baseline  1/10    Time  6    Period  Weeks    Status  New    Target Date  06/21/19      PT LONG TERM GOAL #3   Title  Pt will demonstrate equal to or less than 10% difference in passive great toe extension side-to-side to demonstrate significantly improved ROM.    Baseline  20 degrees left, 50 degrees right    Time  6    Period  Weeks    Status  New    Target Date  06/21/19            Plan - 05/30/19 1124    Clinical Impression Statement  PT continued [place] and manual techniques for increased motion and decreased sensitivity, and therex progression for ankle/foot strengthening and stability. Pt is reporting some R knee pain in loaded wt bearing, with noted midfoot pronation and knee valgus. PT educated patient on medial arch supports to aid in alignment and preventing medial stress on the knee. PT will continue progression as able.    Personal Factors and Comorbidities  Comorbidity 1;Past/Current Experience    Comorbidities  HTN, L bunionectomy    Examination-Activity Limitations  Locomotion Level;Squat    Stability/Clinical Decision Making  Evolving/Moderate complexity    Clinical Decision Making  Moderate    Rehab Potential  Fair    PT Frequency  2x / week    PT Duration  6 weeks    PT Treatment/Interventions  ADLs/Self Care Home Management;Moist Heat;Gait training;Stair training;Functional  mobility training;Patient/family education;Therapeutic activities;Therapeutic exercise;Balance training;Neuromuscular re-education;Manual techniques;Scar mobilization;Passive range of motion;Joint Manipulations    PT Next Visit Plan  MTP joint mobilization, therapeutic [place]  to MTP capsule and plantar fascia prior to stretching; foot intrinsics, ankle DF ROM and strength    PT Home Exercise Plan  No updates this date; conside adding in more SLS or other foot intrinsics in the future.    Consulted and Agree with Plan of Care  Patient       Patient will benefit from skilled therapeutic intervention in order to improve the following deficits and impairments:  Abnormal gait, Decreased range of motion, Difficulty walking, Decreased activity tolerance, Decreased balance, Decreased strength, Decreased mobility, Improper body mechanics, Hypomobility, Decreased scar mobility  Visit Diagnosis: 1. Pain in left toe(s)   2. Pain in left ankle and joints of left foot   3. Stiffness of left foot, not elsewhere classified   4. Other abnormalities of gait and mobility        Problem List Patient Active Problem List   Diagnosis Date Noted  . Left foot pain 02/20/2019  . 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 Alopecia 09/20/2013  . Menstrual cramps 11/11/2011  . History of miscarriage 02/04/2011  . ANEMIA-IRON DEFICIENCY 08/24/2010  . OTHER SPECIFIED ANEMIAS 08/24/2010  . Essential hypertension 08/24/2010  . ALLERGIC RHINITIS 08/24/2010   Staci Acosta PT, DPT Staci Acosta 05/30/2019, 11:26 AM  [place] [facility] PHYSICAL AND SPORTS MEDICINE [address] [place], [address] Phone: [phone]   Fax:  [phone]  Name: TANITRA KOEHLER MRN: [id number] Date of Birth: October 08, 1976

## 2019-05-31 ENCOUNTER — Other Ambulatory Visit: Payer: Self-pay

## 2019-05-31 ENCOUNTER — Ambulatory Visit: Payer: No Typology Code available for payment source | Admitting: Physical Therapy

## 2019-05-31 ENCOUNTER — Encounter: Payer: Self-pay | Admitting: Physical Therapy

## 2019-05-31 DIAGNOSIS — R2689 Other abnormalities of gait and mobility: Secondary | ICD-10-CM

## 2019-05-31 DIAGNOSIS — M79675 Pain in left toe(s): Secondary | ICD-10-CM

## 2019-05-31 DIAGNOSIS — M25572 Pain in left ankle and joints of left foot: Secondary | ICD-10-CM

## 2019-05-31 DIAGNOSIS — M25675 Stiffness of left foot, not elsewhere classified: Secondary | ICD-10-CM

## 2019-05-31 NOTE — Therapy (Signed)
[place] [facility] SPORTS MEDICINE [address] [place], [address] Phone: [phone]   Fax:  [phone]  Physical Therapy Treatment  Patient Details  Name: Jodi Ross MRN: [id number] Date of Birth: 08/01/1976 Referring Provider (PT): [place], [place] T, [place]   Encounter Date: 05/31/2019  PT End of Session - 05/31/19 1728    Visit Number  7    Number of Visits  13    Date for PT Re-Evaluation  06/21/19    PT Start Time  0500    PT Stop Time  0540    PT Time Calculation (min)  40 min    Activity Tolerance  Patient tolerated treatment well;No increased pain    Behavior During Therapy  WFL for tasks assessed/performed       Past Medical History:  Diagnosis Date  . Allergy   . Anemia, iron deficiency   . G6PD (glucose 6 phosphatase deficiency)   . G6PD deficiency     History reviewed. No pertinent surgical history.  There were no vitals filed for this visit.  Subjective Assessment - 05/31/19 1722    Subjective  No pain, doing well with being on her feet at work.    Pertinent History  Pt is a 43 y/o F who presents to PT with a primary medical dx of hallux valgus of the left foot s/p removal of fixator. Pt's main complaint is stiffness and lack of ROM in her left great toe. Pt's worst pain was 1/10 when walking greater than 2 miles or walking for prolonged time without her shoes and a current 0/10 pain. Pt did not state any other aggravating factors than walking greater than 2 miles. Pt's pertinent PMH includes controlled HTN, as her pertinent surgical hx includes a L bunionectomy that occurred last year, which she also reporting having PT for afterwards. 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 Pt has 2-3 STE and right handrail for her home entrance. No reported falls within the last 6 months. Pt's hobbies include waking with her husband for 4-6 miles, to which  she has not been able to do lately 2/2 to pain. Pt's goals are to get full motion in her left great toe.    Limitations  Walking    How long can you sit comfortably?  n/a    How long can you stand comfortably?  n/a    How long can you walk comfortably?  ~2 miles before onset of pain    Diagnostic tests  see chart for imaging results    Patient Stated Goals  To attain full left great toe ROM       Therapeutic Ultrasound 3.3 MHz, 2.2 W/cm^2, 5 minutes to dorsal and dorsomedial MTP capsule; 5 minutes to plantar MTP capsule and distal plantarfascia along the 1st and 2nd ray.  *some manual therapies worked in between [place] treatments  Manual Therapy -Left hallux long axis distraction 3x60sec -Left hallux dorsal glide phalange on metatarsal 3x30sec Grade 4 -hallux extension stretch with ART to plantarfascia and plantar capsule 10x10sec  Therapeutic Exercise -Hip hike with contralateral hip circles standing on dina disc 3x 10 cuing for eccentric control, min UE support occasionally to prevent LOB - SLS ball toss/catch at rebounder x10; standing on foam 3x 10 some LE touch down for balance - Heel raise from step 3x 10 with cuing for eccentric control with good  Care  Patient       Patient will benefit from skilled therapeutic intervention in order to improve the following deficits and impairments:  Abnormal gait, Decreased range of motion, Difficulty walking, Decreased activity tolerance, Decreased balance, Decreased strength, Decreased mobility, Improper body mechanics, Hypomobility, Decreased scar mobility  Visit Diagnosis: 1. Pain in left toe(s)   2. Pain in left ankle and joints of left foot   3. Stiffness of left foot, not elsewhere classified   4. Other abnormalities of gait and mobility        Problem List Patient Active Problem List   Diagnosis Date Noted  . Left foot pain 02/20/2019  . 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 Alopecia 09/20/2013  . Menstrual cramps 11/11/2011  . History of miscarriage 02/04/2011  . ANEMIA-IRON DEFICIENCY 08/24/2010  . OTHER SPECIFIED ANEMIAS 08/24/2010  . Essential hypertension 08/24/2010  . ALLERGIC RHINITIS 08/24/2010   Staci Acostahelsea Miller PT, DPT Staci Acostahelsea Miller 05/31/2019, 6:33 PM  [facility] [address] [place], [place], [id number] Phone: [phone]   Fax:  [phone]  Name: Jodi Ross MRN: [id number] Date of Birth: 05/07/1976  Care  Patient       Patient will benefit from skilled therapeutic intervention in order to improve the following deficits and impairments:  Abnormal gait, Decreased range of motion, Difficulty walking, Decreased activity tolerance, Decreased balance, Decreased strength, Decreased mobility, Improper body mechanics, Hypomobility, Decreased scar mobility  Visit Diagnosis: 1. Pain in left toe(s)   2. Pain in left ankle and joints of left foot   3. Stiffness of left foot, not elsewhere classified   4. Other abnormalities of gait and mobility        Problem List Patient Active Problem List   Diagnosis Date Noted  . Left foot pain 02/20/2019  . 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 Alopecia 09/20/2013  . Menstrual cramps 11/11/2011  . History of miscarriage 02/04/2011  . ANEMIA-IRON DEFICIENCY 08/24/2010  . OTHER SPECIFIED ANEMIAS 08/24/2010  . Essential hypertension 08/24/2010  . ALLERGIC RHINITIS 08/24/2010   Staci Acostahelsea Miller PT, DPT Staci Acostahelsea Miller 05/31/2019, 6:33 PM  [facility] [address] [place], [place], [id number] Phone: [phone]   Fax:  [phone]  Name: Jodi Ross MRN: [id number] Date of Birth: 05/07/1976

## 2019-06-05 ENCOUNTER — Encounter: Payer: No Typology Code available for payment source | Admitting: Physical Therapy

## 2019-06-12 ENCOUNTER — Encounter: Payer: Self-pay | Admitting: Physical Therapy

## 2019-06-12 ENCOUNTER — Ambulatory Visit: Payer: No Typology Code available for payment source | Admitting: Physical Therapy

## 2019-06-12 ENCOUNTER — Other Ambulatory Visit: Payer: Self-pay

## 2019-06-12 DIAGNOSIS — M79675 Pain in left toe(s): Secondary | ICD-10-CM

## 2019-06-12 DIAGNOSIS — R2689 Other abnormalities of gait and mobility: Secondary | ICD-10-CM

## 2019-06-12 DIAGNOSIS — M25572 Pain in left ankle and joints of left foot: Secondary | ICD-10-CM

## 2019-06-12 DIAGNOSIS — M25675 Stiffness of left foot, not elsewhere classified: Secondary | ICD-10-CM

## 2019-06-12 NOTE — Therapy (Signed)
PT Home Exercise Plan  No updates this date; conside adding in more SLS or other foot intrinsics in the future.    Consulted and Agree with Plan of Care  Patient       Patient will benefit from skilled therapeutic intervention in order to improve the following deficits and impairments:  Abnormal gait, Decreased range of motion, Difficulty walking, Decreased activity tolerance, Decreased balance, Decreased strength, Decreased mobility, Improper body mechanics, Hypomobility, Decreased scar mobility  Visit Diagnosis: Pain in left toe(s)  Pain in left ankle and joints of left foot  Stiffness of left foot, not elsewhere classified  Other abnormalities of gait and mobility     Problem List Patient Active Problem List   Diagnosis Date Noted  . Left foot pain 02/20/2019  . 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 Alopecia 09/20/2013  . Menstrual cramps 11/11/2011  . History of miscarriage 02/04/2011  . ANEMIA-IRON DEFICIENCY 08/24/2010  . OTHER SPECIFIED ANEMIAS 08/24/2010  . Essential hypertension 08/24/2010  . ALLERGIC RHINITIS 08/24/2010   Staci Acosta PT, DPT Staci Acosta 43/25/2020, 4:48 PM  [facility] PHYSICAL AND SPORTS MEDICINE [address], [place], [address] Phone: [phone]   Fax:  [phone]  Name: Jodi Ross MRN: [id number] Date of Birth: 05-09-1976  [facility]YSICAL AND SPORTS MEDICINE [address] [place], [address] Phone: [phone]   Fax:  [phone]  Physical Therapy Treatment  Patient Details  Name: Jodi Ross MRN: [id number] Date of Birth: 1976-03-08 Referring Provider (PT): [place], [place] T, [place]   Encounter Date: 06/12/2019  PT End of Session - 06/12/19 1623    Visit Number  8    Number of Visits  13    Date for PT Re-Evaluation  06/21/19    PT Start Time  0400    PT Stop Time  9518    PT Time Calculation (min)  45 min    Activity Tolerance  Patient tolerated treatment well;No increased pain    Behavior During Therapy  WFL for tasks assessed/performed       Past Medical History:  Diagnosis Date  . Allergy   . Anemia, iron deficiency   . G6PD (glucose 6 phosphatase deficiency)   . G6PD deficiency     History reviewed. No pertinent surgical history.  There were no vitals filed for this visit.  Subjective Assessment - 06/12/19 1621    Subjective  NO pain today. Reports she thinks her motion is getting better, and she has been wokring on gait mechanics.    Pertinent History  Pt is a 43 y/o F who presents to PT with a primary medical dx of hallux valgus of the left foot s/p removal of fixator. Pt's main complaint is stiffness and lack of ROM in her left great toe. Pt's worst pain was 1/10 when walking greater than 2 miles or walking for prolonged time without her shoes and a current 0/10 pain. Pt did not state any other aggravating factors than walking greater than 2 miles. Pt's pertinent PMH includes controlled HTN, as her pertinent surgical hx includes a L bunionectomy that occurred last year, which she also reporting having PT for afterwards. 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 Pt has 2-3 STE and right handrail for her home entrance. No reported falls within the last 6 months. Pt's hobbies  include waking with her husband for 4-6 miles, to which she has not been able to do lately 2/2 to pain. Pt's goals are to get full motion in her left great toe.    Limitations  Walking    How long can you sit comfortably?  n/a    How long can you stand comfortably?  n/a    How long can you walk comfortably?  ~2 miles before onset of pain    Diagnostic tests  see chart for imaging results    Patient Stated Goals  To attain full left great toe ROM            Therapeutic Ultrasound 3.3 MHz, 1.2 W/cm^2, 5 minutes to dorsal and dorsomedial MTP capsule; 5 minutes to plantar MTP capsule and distal plantarfascia along the 1st and 2nd ray.  *some manual therapies worked in between [place] treatments  Manual Therapy -Left hallux long axis distraction 3x60sec -Left hallux dorsal glide phalange on metatarsal 3x30sec Grade 4 -hallux extension stretch with ART to plantarfascia and plantar capsule 10x10sec  Therapeutic Exercise - Slider lunge 3x 10 bilat with cuing for 2sec hold for max GT ext with LLE sliding good carry over - Slider circles in mini squat 3x 10 each side with PF/DF focus on sliding foot  -Lateral step down 3x  [facility]TS MEDICINE [address] [place], [address] Phone: [phone]   Fax:  [phone]  Physical Therapy Treatment  Patient Details  Name: Jodi Ross MRN: [id number] Date of Birth: 1976-03-08 Referring Provider (PT): [place], [place] T, [place]   Encounter Date: 06/12/2019  PT End of Session - 06/12/19 1623    Visit Number  8    Number of Visits  13    Date for PT Re-Evaluation  06/21/19    PT Start Time  0400    PT Stop Time  9518    PT Time Calculation (min)  45 min    Activity Tolerance  Patient tolerated treatment well;No increased pain    Behavior During Therapy  WFL for tasks assessed/performed       Past Medical History:  Diagnosis Date  . Allergy   . Anemia, iron deficiency   . G6PD (glucose 6 phosphatase deficiency)   . G6PD deficiency     History reviewed. No pertinent surgical history.  There were no vitals filed for this visit.  Subjective Assessment - 06/12/19 1621    Subjective  NO pain today. Reports she thinks her motion is getting better, and she has been wokring on gait mechanics.    Pertinent History  Pt is a 43 y/o F who presents to PT with a primary medical dx of hallux valgus of the left foot s/p removal of fixator. Pt's main complaint is stiffness and lack of ROM in her left great toe. Pt's worst pain was 1/10 when walking greater than 2 miles or walking for prolonged time without her shoes and a current 0/10 pain. Pt did not state any other aggravating factors than walking greater than 2 miles. Pt's pertinent PMH includes controlled HTN, as her pertinent surgical hx includes a L bunionectomy that occurred last year, which she also reporting having PT for afterwards. 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 Pt has 2-3 STE and right handrail for her home entrance. No reported falls within the last 6 months. Pt's hobbies  include waking with her husband for 4-6 miles, to which she has not been able to do lately 2/2 to pain. Pt's goals are to get full motion in her left great toe.    Limitations  Walking    How long can you sit comfortably?  n/a    How long can you stand comfortably?  n/a    How long can you walk comfortably?  ~2 miles before onset of pain    Diagnostic tests  see chart for imaging results    Patient Stated Goals  To attain full left great toe ROM            Therapeutic Ultrasound 3.3 MHz, 1.2 W/cm^2, 5 minutes to dorsal and dorsomedial MTP capsule; 5 minutes to plantar MTP capsule and distal plantarfascia along the 1st and 2nd ray.  *some manual therapies worked in between [place] treatments  Manual Therapy -Left hallux long axis distraction 3x60sec -Left hallux dorsal glide phalange on metatarsal 3x30sec Grade 4 -hallux extension stretch with ART to plantarfascia and plantar capsule 10x10sec  Therapeutic Exercise - Slider lunge 3x 10 bilat with cuing for 2sec hold for max GT ext with LLE sliding good carry over - Slider circles in mini squat 3x 10 each side with PF/DF focus on sliding foot  -Lateral step down 3x

## 2019-06-14 ENCOUNTER — Encounter: Payer: Self-pay | Admitting: Physical Therapy

## 2019-06-14 ENCOUNTER — Other Ambulatory Visit: Payer: Self-pay

## 2019-06-14 ENCOUNTER — Ambulatory Visit: Payer: No Typology Code available for payment source | Admitting: Physical Therapy

## 2019-06-14 DIAGNOSIS — M25675 Stiffness of left foot, not elsewhere classified: Secondary | ICD-10-CM

## 2019-06-14 DIAGNOSIS — M25572 Pain in left ankle and joints of left foot: Secondary | ICD-10-CM

## 2019-06-14 DIAGNOSIS — M79675 Pain in left toe(s): Secondary | ICD-10-CM

## 2019-06-14 DIAGNOSIS — R2689 Other abnormalities of gait and mobility: Secondary | ICD-10-CM

## 2019-06-14 NOTE — Therapy (Signed)
the future.    Consulted and Agree with Plan of Care  Patient       Patient will benefit from skilled therapeutic intervention in order to improve the following deficits and impairments:  Abnormal gait, Decreased range of motion, Difficulty walking, Decreased activity tolerance, Decreased balance, Decreased strength, Decreased mobility, Improper body mechanics, Hypomobility, Decreased scar mobility  Visit Diagnosis: Pain in left toe(s)  Pain in left ankle and joints of left foot  Stiffness of left foot, not elsewhere classified  Other abnormalities of gait and mobility     Problem List Patient Active Problem List   Diagnosis Date Noted  . Left foot pain 02/20/2019  . 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 Alopecia 09/20/2013  . Menstrual cramps 11/11/2011  . History of miscarriage 02/04/2011  . ANEMIA-IRON DEFICIENCY 08/24/2010  . OTHER SPECIFIED ANEMIAS 08/24/2010  . Essential hypertension 08/24/2010  . ALLERGIC RHINITIS 08/24/2010   Staci Acostahelsea Miller PT, DPT Staci Acostahelsea Miller 06/14/2019, 6:17 PM  [facility] [address] [place], [place], [id number] Phone: [phone]   Fax:  [phone]  Name: Jodi RegalMarcel A Ross MRN: [id number] Date of Birth: 04/14/1976  [place] [facility] PHYSICAL AND SPORTS MEDICINE [address] [place], [address] Phone: [phone]   Fax:  [phone]  Physical Therapy Treatment  Patient Details  Name: Jodi Ross MRN: [id number] Date of Birth: September 02, 1976 Referring Provider (PT): Jodi Ross, [place], [place]   Encounter Date: 06/14/2019  PT End of Session - 06/14/19 1803    Visit Number  9    Number of Visits  13    Date for PT Re-Evaluation  06/21/19    PT Start Time  0525    PT Stop Time  0610    PT Time Calculation (min)  45 min    Activity Tolerance  Patient tolerated treatment well;No increased pain    Behavior During Therapy  WFL for tasks assessed/performed       Past Medical History:  Diagnosis Date  . Allergy   . Anemia, iron deficiency   . G6PD (glucose 6 phosphatase deficiency)   . G6PD deficiency     History reviewed. No pertinent surgical history.  There were no vitals filed for this visit.  Subjective Assessment - 06/14/19 1726    Subjective  No pain today, reports her toe does not feel as stiff.    Pertinent History  Pt is a 43 y/o F who presents to PT with a primary medical dx of hallux valgus of the left foot s/p removal of fixator. Pt's main complaint is stiffness and lack of ROM in her left great toe. Pt's worst pain was 1/10 when walking greater than 2 miles or walking for prolonged time without her shoes and a current 0/10 pain. Pt did not state any other aggravating factors than walking greater than 2 miles. Pt's pertinent PMH includes controlled HTN, as her pertinent surgical hx includes a L bunionectomy that occurred last year, which she also reporting having PT for afterwards. 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 Pt has 2-3 STE and right handrail for her home entrance. No reported falls within the last 6 months. Pt's hobbies include waking with her husband for 4-6 miles, to which  she has not been able to do lately 2/2 to pain. Pt's goals are to get full motion in her left great toe.    How long can you sit comfortably?  n/a    How long can you stand comfortably?  n/a    How long can you walk comfortably?  ~2 miles before onset of pain    Diagnostic tests  see chart for imaging results    Patient Stated Goals  To attain full left great toe ROM       Therapeutic Ultrasound 3.3 MHz, 1.2 W/cm^2, 5 minutes to dorsal and dorsomedial MTP capsule; 5 minutes to plantar MTP capsule and distal plantarfascia along the 1st and 2nd ray.  *some manual therapies worked in between [place] treatments  Manual Therapy -Left hallux long axis distraction 3x60sec -Left hallux dorsal glide phalange on metatarsal 3x30sec Grade 4 -hallux extension stretch with ART to plantarfascia and plantar capsule 10x10sec  Therapeutic Exercise - [place] split squat 3x 10 with demo and cuing initially for proper form with good carry over following - SLS on foam pad with semi circle cone taps 2x 5 around and back - SLS on foam pad ball toss with PT tossing ball outside BOS - Slider circles in mini squat with LLE on foam pad3x 10 each side  the future.    Consulted and Agree with Plan of Care  Patient       Patient will benefit from skilled therapeutic intervention in order to improve the following deficits and impairments:  Abnormal gait, Decreased range of motion, Difficulty walking, Decreased activity tolerance, Decreased balance, Decreased strength, Decreased mobility, Improper body mechanics, Hypomobility, Decreased scar mobility  Visit Diagnosis: Pain in left toe(s)  Pain in left ankle and joints of left foot  Stiffness of left foot, not elsewhere classified  Other abnormalities of gait and mobility     Problem List Patient Active Problem List   Diagnosis Date Noted  . Left foot pain 02/20/2019  . 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 Alopecia 09/20/2013  . Menstrual cramps 11/11/2011  . History of miscarriage 02/04/2011  . ANEMIA-IRON DEFICIENCY 08/24/2010  . OTHER SPECIFIED ANEMIAS 08/24/2010  . Essential hypertension 08/24/2010  . ALLERGIC RHINITIS 08/24/2010   Staci Acostahelsea Miller PT, DPT Staci Acostahelsea Miller 06/14/2019, 6:17 PM  [facility] PH[facility] MEDICINE [address] [place], [place], [id number] Phone: [phone]   Fax:  [phone]  Name: Jodi RegalMarcel A Ross MRN: [id number] Date of Birth: 04/14/1976

## 2019-06-18 ENCOUNTER — Other Ambulatory Visit: Payer: Self-pay

## 2019-06-18 ENCOUNTER — Ambulatory Visit: Payer: No Typology Code available for payment source | Admitting: Physical Therapy

## 2019-06-18 ENCOUNTER — Encounter: Payer: Self-pay | Admitting: Physical Therapy

## 2019-06-18 DIAGNOSIS — R2689 Other abnormalities of gait and mobility: Secondary | ICD-10-CM

## 2019-06-18 DIAGNOSIS — M25572 Pain in left ankle and joints of left foot: Secondary | ICD-10-CM

## 2019-06-18 DIAGNOSIS — M25675 Stiffness of left foot, not elsewhere classified: Secondary | ICD-10-CM

## 2019-06-18 DIAGNOSIS — M79675 Pain in left toe(s): Secondary | ICD-10-CM | POA: Diagnosis not present

## 2019-06-18 NOTE — Therapy (Signed)
[facility] PHYSICAL AND SPORTS MEDICINE [address], [place], [address] Phone: [phone]   Fax:  [phone]  Physical Therapy Treatment  Patient Details  Name: Jodi Ross MRN: [id number] Date of Birth: Dec 26, 1975 Referring Provider (PT): [place], [place], [place]   Encounter Date: 06/18/2019  PT End of Session - 06/18/19 1507    Visit Number  10    Number of Visits  13    Date for PT Re-Evaluation  06/21/19    PT Start Time  0230    PT Stop Time  0315    PT Time Calculation (min)  45 min    Activity Tolerance  Patient tolerated treatment well;No increased pain    Behavior During Therapy  WFL for tasks assessed/performed       Past Medical History:  Diagnosis Date  . Allergy   . Anemia, iron deficiency   . G6PD (glucose 6 phosphatase deficiency)   . G6PD deficiency     History reviewed. No pertinent surgical history.  There were no vitals filed for this visit.  Subjective Assessment - 06/18/19 1433    Subjective  Reports she has no pain, reports she can tell motion is better with walking.    Pertinent History  Pt is a 43 y/o F who presents to PT with a primary medical dx of hallux valgus of the left foot s/p removal of fixator. Pt's main complaint is stiffness and lack of ROM in her left great toe. Pt's worst pain was 1/10 when walking greater than 2 miles or walking for prolonged time without her shoes and a current 0/10 pain. Pt did not state any other aggravating factors than walking greater than 2 miles. Pt's pertinent PMH includes controlled HTN, as her pertinent surgical hx includes a L bunionectomy that occurred last year, which she also reporting having PT for afterwards. 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 Pt has 2-3 STE and right handrail for her home entrance. No reported falls within the last 6 months. Pt's hobbies include waking with her husband  for 4-6 miles, to which she has not been able to do lately 2/2 to pain. Pt's goals are to get full motion in her left great toe.    How long can you sit comfortably?  n/a    How long can you stand comfortably?  n/a    How long can you walk comfortably?  ~2 miles before onset of pain    Patient Stated Goals  To attain full left great toe ROM       Therapeutic Ultrasound 3.3 MHz,1.2 W/cm^2, 5 minutes to dorsal and dorsomedial MTP capsule; 5 minutes to plantar MTP capsule and distal plantarfascia along the 1st and 2nd ray.  *some manual therapies worked in between [place] treatments  Manual Therapy -Left hallux long axis distraction 3x60sec -Left hallux dorsal glide phalange on metatarsal 3x30sec Grade 4 -hallux extension stretch with ART to plantarfascia and plantar capsule 10x10sec  Therapeutic Exercise -Forward lunge onto dina disc with RTB giving resistance for abd throughout 3x 10 with cuing for proper form for initial set up with good carry over following - Slider circles in mini squat with LLE on large dina disc 3x 10 each side with cuing to maintain LLE  - RLE hip flex LLE stance on bosu ball (hard side) 3x 10 with cuing for eccentric control and "tapping  RLE) - Standing on LLE on bosu ball large alphabet with bilat UE  - SL stance on foam pad 5lb ball toss to rebounder 2x 10              - Heel raise from step 3x 10 with cuing for eccentric control with good carry over; increased eel height, still not to max availible                         PT Education - 06/18/19 1503    Education Details  therex form    Person(s) Educated  Patient    Methods  Explanation;Demonstration;Tactile cues;Verbal cues    Comprehension  Verbalized understanding;Returned demonstration;Verbal cues required       PT Short Term Goals - 05/10/19 1844      PT SHORT TERM GOAL #1   Title  Pt will be independent and compliant with her HEP.    Time  2    Period  Weeks    Status  New     Target Date  05/24/19        PT Long Term Goals - 05/10/19 1844      PT LONG TERM GOAL #1   Title  Pt will score a 80/80 on the LEFS to demonstrate an improvement in disability and function.    Baseline  73/80    Time  6    Period  Weeks    Status  New    Target Date  06/21/19      PT LONG TERM GOAL #2   Title  Pt will be able to ambulate greater than 2 miles with 0/10 pain exacerbation to demonstrate improved function and quality of life.    Baseline  1/10    Time  6    Period  Weeks    Status  New    Target Date  06/21/19      PT LONG TERM GOAL #3   Title  Pt will demonstrate equal to or less than 10% difference in passive great toe extension side-to-side to demonstrate significantly improved ROM.    Baseline  20 degrees left, 50 degrees right    Time  6    Period  Weeks    Status  New    Target Date  06/21/19            Plan - 06/18/19 1600    Clinical Impression Statement  Patient continues to repond well to modality with manual techniques with increased GT motion following. PT continued therex progression for ankle/foot stability and strengthening and GT motion with good carry over following demo and cuing. PT will continue progression as able.    Personal Factors and Comorbidities  Comorbidity 1;Past/Current Experience    Comorbidities  HTN, L bunionectomy    Examination-Activity Limitations  Locomotion Level;Squat    Examination-Participation Restrictions  Community Activity    Stability/Clinical Decision Making  Evolving/Moderate complexity    Clinical Decision Making  Moderate    Rehab Potential  Fair    PT Frequency  2x / week    PT Duration  6 weeks    PT Treatment/Interventions  ADLs/Self Care Home Management;Moist Heat;Gait training;Stair training;Functional mobility training;Patient/family education;Therapeutic activities;Therapeutic exercise;Balance training;Neuromuscular re-education;Manual techniques;Scar mobilization;Passive range of motion;Joint  Manipulations    PT Next Visit Plan  MTP joint mobilization, therapeutic [place] to MTP capsule and plantar fascia prior to stretching; foot intrinsics, ankle DF ROM and strength    PT  Home Exercise Plan  No updates this date; conside adding in more SLS or other foot intrinsics in the future.    Consulted and Agree with Plan of Care  Patient       Patient will benefit from skilled therapeutic intervention in order to improve the following deficits and impairments:  Abnormal gait, Decreased range of motion, Difficulty walking, Decreased activity tolerance, Decreased balance, Decreased strength, Decreased mobility, Improper body mechanics, Hypomobility, Decreased scar mobility  Visit Diagnosis: No diagnosis found.     Problem List Patient Active Problem List   Diagnosis Date Noted  . Left foot pain 02/20/2019  . 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 Alopecia 09/20/2013  . Menstrual cramps 11/11/2011  . History of miscarriage 02/04/2011  . ANEMIA-IRON DEFICIENCY 08/24/2010  . OTHER SPECIFIED ANEMIAS 08/24/2010  . Essential hypertension 08/24/2010  . ALLERGIC RHINITIS 08/24/2010   Staci Acosta PT, DPT Staci Acosta 06/18/2019, 4:01 PM  [facility] PHYSICAL AND SPORTS MEDICINE [address], [place], [address] Phone: [phone]   Fax:  [phone]  Name: Jodi Ross MRN: [id number] Date of Birth: [birth year]

## 2019-06-19 ENCOUNTER — Ambulatory Visit: Payer: No Typology Code available for payment source | Admitting: Physical Therapy

## 2019-06-21 ENCOUNTER — Encounter: Payer: No Typology Code available for payment source | Admitting: Physical Therapy

## 2019-06-26 ENCOUNTER — Other Ambulatory Visit: Payer: Self-pay

## 2019-06-26 ENCOUNTER — Encounter: Payer: Self-pay | Admitting: Physical Therapy

## 2019-06-26 ENCOUNTER — Ambulatory Visit: Payer: No Typology Code available for payment source | Attending: Podiatry | Admitting: Physical Therapy

## 2019-06-26 DIAGNOSIS — R2689 Other abnormalities of gait and mobility: Secondary | ICD-10-CM | POA: Insufficient documentation

## 2019-06-26 DIAGNOSIS — M25572 Pain in left ankle and joints of left foot: Secondary | ICD-10-CM | POA: Insufficient documentation

## 2019-06-26 DIAGNOSIS — M25675 Stiffness of left foot, not elsewhere classified: Secondary | ICD-10-CM | POA: Insufficient documentation

## 2019-06-26 DIAGNOSIS — M79675 Pain in left toe(s): Secondary | ICD-10-CM | POA: Insufficient documentation

## 2019-06-26 NOTE — Addendum Note (Signed)
Addended by: Shelton Silvas on: 06/26/2019 03:11 PM   Modules accepted: Orders

## 2019-06-26 NOTE — Therapy (Signed)
Evolving/Moderate complexity    Clinical Decision Making  Moderate    Rehab Potential  Fair    PT Frequency  2x / week    PT Duration  6 weeks    PT Treatment/Interventions  ADLs/Self Care Home Management;Moist Heat;Gait training;Stair training;Functional mobility training;Patient/family education;Therapeutic activities;Therapeutic exercise;Balance training;Neuromuscular re-education;Manual techniques;Scar mobilization;Passive range of motion;Joint Manipulations    PT Next Visit Plan  MTP joint mobilization, therapeutic [place] to MTP capsule and plantar fascia prior to stretching; foot intrinsics, ankle DF ROM and strength    PT Home Exercise Plan  No updates this date; conside adding in more SLS or other foot intrinsics in the future.    Consulted and Agree with Plan of Care  Patient       Patient will benefit from skilled therapeutic intervention in order to improve the following deficits and impairments:  Abnormal gait, Decreased range of motion, Difficulty walking, Decreased activity tolerance, Decreased balance, Decreased strength, Decreased mobility, Improper body mechanics, Hypomobility, Decreased scar mobility  Visit Diagnosis: Pain in left toe(s)  Pain in left ankle and joints of left foot  Stiffness of left foot, not elsewhere classified  Other abnormalities of gait and mobility     Problem List Patient Active Problem List   Diagnosis Date Noted  . Left foot pain 02/20/2019  . 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 Alopecia 09/20/2013  . Menstrual cramps 11/11/2011  . History of miscarriage 02/04/2011  . ANEMIA-IRON DEFICIENCY 08/24/2010  . OTHER SPECIFIED ANEMIAS 08/24/2010  . Essential hypertension 08/24/2010  . ALLERGIC RHINITIS 08/24/2010   Staci Acosta PT, DPT Staci Acosta 06/26/2019, 2:17 PM  [facility] PHYSICAL AND SPORTS  MEDICINE [address], [place], [address] Phone: [phone]   Fax:  [phone]  Name: Jodi Ross MRN: [id number] Date of Birth: [birth year]  Evolving/Moderate complexity    Clinical Decision Making  Moderate    Rehab Potential  Fair    PT Frequency  2x / week    PT Duration  6 weeks    PT Treatment/Interventions  ADLs/Self Care Home Management;Moist Heat;Gait training;Stair training;Functional mobility training;Patient/family education;Therapeutic activities;Therapeutic exercise;Balance training;Neuromuscular re-education;Manual techniques;Scar mobilization;Passive range of motion;Joint Manipulations    PT Next Visit Plan  MTP joint mobilization, therapeutic [place] to MTP capsule and plantar fascia prior to stretching; foot intrinsics, ankle DF ROM and strength    PT Home Exercise Plan  No updates this date; conside adding in more SLS or other foot intrinsics in the future.    Consulted and Agree with Plan of Care  Patient       Patient will benefit from skilled therapeutic intervention in order to improve the following deficits and impairments:  Abnormal gait, Decreased range of motion, Difficulty walking, Decreased activity tolerance, Decreased balance, Decreased strength, Decreased mobility, Improper body mechanics, Hypomobility, Decreased scar mobility  Visit Diagnosis: Pain in left toe(s)  Pain in left ankle and joints of left foot  Stiffness of left foot, not elsewhere classified  Other abnormalities of gait and mobility     Problem List Patient Active Problem List   Diagnosis Date Noted  . Left foot pain 02/20/2019  . 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 Alopecia 09/20/2013  . Menstrual cramps 11/11/2011  . History of miscarriage 02/04/2011  . ANEMIA-IRON DEFICIENCY 08/24/2010  . OTHER SPECIFIED ANEMIAS 08/24/2010  . Essential hypertension 08/24/2010  . ALLERGIC RHINITIS 08/24/2010   Staci Acosta PT, DPT Staci Acosta 06/26/2019, 2:17 PM  [facility] PHYSICAL AND SPORTS  MEDICINE [address], [place], [address] Phone: [phone]   Fax:  [phone]  Name: Jodi Ross MRN: [id number] Date of Birth: [birth year]  [facility] PHYSICAL AND SPORTS MEDICINE [address] [place], [place], [id number] Phone: [phone]   Fax:  [phone]  Physical Therapy Treatment/Progress Note  Patient Details  Name: Jodi Ross MRN: [id number] Date of Birth: 04/04/1976 Referring Provider (PT): [place], [place], [place]   Encounter Date: 06/26/2019  PT End of Session - 06/26/19 1326    Visit Number  11    Number of Visits  17    Date for PT Re-Evaluation  07/24/19    PT Start Time  0100    PT Stop Time  0145    PT Time Calculation (min)  45 min    Activity Tolerance  Patient tolerated treatment well;No increased pain    Behavior During Therapy  WFL for tasks assessed/performed       Past Medical History:  Diagnosis Date  . Allergy   . Anemia, iron deficiency   . G6PD (glucose 6 phosphatase deficiency)   . G6PD deficiency     History reviewed. No pertinent surgical history.  There were no vitals filed for this visit.  Subjective Assessment - 06/26/19 1304    Subjective  Patinet reports motion is getting better with walking, she was busy at work yesterday, and did well on her feet 12hours    Pertinent History  Pt is a 43 y/o F who presents to PT with a primary medical dx of hallux valgus of the left foot s/p removal of fixator. Pt's main complaint is stiffness and lack of ROM in her left great toe. Pt's worst pain was 1/10 when walking greater than 2 miles or walking for prolonged time without her shoes and a current 0/10 pain. Pt did not state any other aggravating factors than walking greater than 2 miles. Pt's pertinent PMH includes controlled HTN, as her pertinent surgical hx includes a L bunionectomy that occurred last year, which she also reporting having PT for afterwards. 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 Pt has 2-3 STE and right handrail for her home entrance. No reported falls within the last  6 months. Pt's hobbies include waking with her husband for 4-6 miles, to which she has not been able to do lately 2/2 to pain. Pt's goals are to get full motion in her left great toe.    Limitations  Walking    How long can you sit comfortably?  n/a    How long can you stand comfortably?  n/a    How long can you walk comfortably?  ~2 miles before onset of pain    Diagnostic tests  see chart for imaging results    Patient Stated Goals  To attain full left great toe ROM       Therapeutic Ultrasound 3.3 MHz,1.2 W/cm^2, 5 minutes to dorsal and dorsomedial MTP capsule; 5 minutes to plantar MTP capsule and distal plantarfascia along the 1st and 2nd ray.  *some manual therapies worked in between US treatments  Manual Therapy -Left hallux long axis distraction 3x60sec -Left hallux dorsal glide phalange on metatarsal 3x30sec Grade 4 -hallux extension stretch with ART to plantarfascia and plantar capsule 10x10sec  Therapeutic Exercise  - SL RDL BW x10 with good carry over of proper form following demo and  cuing; on foam pad 2x 10 with increased lateral postural sway  - SLS on foam trials R: 60sec L: 32sec with patient reporting increased effort  on

## 2019-06-28 ENCOUNTER — Ambulatory Visit: Payer: No Typology Code available for payment source | Admitting: Physical Therapy

## 2019-07-04 ENCOUNTER — Other Ambulatory Visit: Payer: Self-pay

## 2019-07-04 ENCOUNTER — Ambulatory Visit: Payer: No Typology Code available for payment source | Admitting: Physical Therapy

## 2019-07-04 ENCOUNTER — Encounter: Payer: Self-pay | Admitting: Physical Therapy

## 2019-07-04 DIAGNOSIS — M79675 Pain in left toe(s): Secondary | ICD-10-CM

## 2019-07-04 DIAGNOSIS — R2689 Other abnormalities of gait and mobility: Secondary | ICD-10-CM

## 2019-07-04 DIAGNOSIS — M25572 Pain in left ankle and joints of left foot: Secondary | ICD-10-CM

## 2019-07-04 DIAGNOSIS — M25675 Stiffness of left foot, not elsewhere classified: Secondary | ICD-10-CM

## 2019-07-04 NOTE — Therapy (Signed)
Visit Plan  MTP joint mobilization, therapeutic US to MTP capsule and plantar fascia prior to stretching; foot intrinsics, ankle DF ROM and strength    PT Home Exercise Plan  No updates this date; conside adding in more SLS or other foot intrinsics in the future.    Consulted and Agree with Plan of Care  Patient       Patient will benefit from skilled therapeutic intervention in order to improve the following deficits and impairments:  Abnormal gait, Decreased range of motion, Difficulty walking, Decreased activity tolerance, Decreased balance, Decreased strength, Decreased mobility, Improper body mechanics, Hypomobility, Decreased scar mobility  Visit Diagnosis: Pain in left toe(s)  Pain in left ankle and joints of left foot  Stiffness of left foot, not elsewhere classified  Other abnormalities of gait and mobility     Problem List Patient Active Problem List   Diagnosis Date Noted  . Left foot pain 02/20/2019  . 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 Alopecia 09/20/2013  . Menstrual cramps 11/11/2011  . History of miscarriage 02/04/2011  . ANEMIA-IRON DEFICIENCY 08/24/2010  . OTHER SPECIFIED ANEMIAS 08/24/2010  . Essential hypertension 08/24/2010  . ALLERGIC RHINITIS 08/24/2010   Staci Acostahelsea Miller PT, DPT Staci Acostahelsea Miller 07/04/2019, 3:23 PM  [facility] PHYSICAL AND SPORTS MEDICINE [address] [place], [place], [id number] Phone: [phone]   Fax:  [phone]  Name: Jodi Ross MRN: [id number] Date of Birth: 10/17/1976  [facility] PHYSICAL AND SPORTS MEDICINE [address], [place], [address] Phone: [phone]   Fax:  [phone]  Physical Therapy Treatment  Patient Details  Name: Jodi Ross MRN: [id number] Date of Birth: [birth year] Referring Provider (PT): [place], [place] T, [place]   Encounter Date: 07/04/2019  PT End of Session - 07/04/19 1400    Visit Number  12    Number of Visits  17    Date for PT Re-Evaluation  07/24/19    PT Start Time  0145    PT Stop Time  0225    PT Time Calculation (min)  40 min    Activity Tolerance  Patient tolerated treatment well;No increased pain    Behavior During Therapy  WFL for tasks assessed/performed       Past Medical History:  Diagnosis Date  . Allergy   . Anemia, iron deficiency   . G6PD (glucose 6 phosphatase deficiency)   . G6PD deficiency     History reviewed. No pertinent surgical history.  There were no vitals filed for this visit.  Subjective Assessment - 07/04/19 1345    Subjective  Reports she is feeling good, motion is improving.    Pertinent History  Pt is a 43 y/o F who presents to PT with a primary medical dx of hallux valgus of the left foot s/p removal of fixator. Pt's main complaint is stiffness and lack of ROM in her left great toe. Pt's worst pain was 1/10 when walking greater than 2 miles or walking for prolonged time without her shoes and a current 0/10 pain. Pt did not state any other aggravating factors than walking greater than 2 miles. Pt's pertinent PMH includes controlled HTN, as her pertinent surgical hx includes a L bunionectomy that occurred last year, which she also reporting having PT for afterwards. 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 Pt has 2-3 STE and right handrail for her home entrance. No reported falls within the last 6 months. Pt's hobbies include waking with her husband for 4-6 miles, to which she  has not been able to do lately 2/2 to pain. Pt's goals are to get full motion in her left great toe.    Limitations  Walking    How long can you sit comfortably?  n/a    How long can you stand comfortably?  n/a    How long can you walk comfortably?  ~2 miles before onset of pain    Diagnostic tests  see chart for imaging results    Patient Stated Goals  To attain full left great toe ROM          Gait Training: Gait training on treadmill with emphasis on heel strike and toe push off bilat. Patient is able to correct this near 100% bilat. Education provided on walking mechanics and implications up the chain on poor mechanics.   Manual Therapy -Left hallux long axis distraction 3x60sec -Left hallux dorsal glide phalange on metatarsal 3x30sec Grade 4 -hallux extension stretch with ART to plantarfascia and plantar capsule 10x10sec  Therapeutic Exercise             - Heel raises 3x 10 with patient reporting stretch on "bottom of foot on L" good form  - SL RDL BW on foam pad 2x 10 with increased lateral postural sway  - Standing on foam LLE ball tosses/catch with PT  Visit Plan  MTP joint mobilization, therapeutic US to MTP capsule and plantar fascia prior to stretching; foot intrinsics, ankle DF ROM and strength    PT Home Exercise Plan  No updates this date; conside adding in more SLS or other foot intrinsics in the future.    Consulted and Agree with Plan of Care  Patient       Patient will benefit from skilled therapeutic intervention in order to improve the following deficits and impairments:  Abnormal gait, Decreased range of motion, Difficulty walking, Decreased activity tolerance, Decreased balance, Decreased strength, Decreased mobility, Improper body mechanics, Hypomobility, Decreased scar mobility  Visit Diagnosis: Pain in left toe(s)  Pain in left ankle and joints of left foot  Stiffness of left foot, not elsewhere classified  Other abnormalities of gait and mobility     Problem List Patient Active Problem List   Diagnosis Date Noted  . Left foot pain 02/20/2019  . 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 Alopecia 09/20/2013  . Menstrual cramps 11/11/2011  . History of miscarriage 02/04/2011  . ANEMIA-IRON DEFICIENCY 08/24/2010  . OTHER SPECIFIED ANEMIAS 08/24/2010  . Essential hypertension 08/24/2010  . ALLERGIC RHINITIS 08/24/2010   Staci Acostahelsea Miller PT, DPT Staci Acostahelsea Miller 07/04/2019, 3:23 PM  [facility] PHYSICAL AND SPORTS MEDICINE [address] [place], [place], [id number] Phone: [phone]   Fax:  [phone]  Name: Jodi Ross MRN: [id number] Date of Birth: 10/17/1976

## 2019-07-11 ENCOUNTER — Other Ambulatory Visit: Payer: Self-pay

## 2019-07-11 ENCOUNTER — Ambulatory Visit: Payer: No Typology Code available for payment source | Admitting: Physical Therapy

## 2019-07-12 ENCOUNTER — Ambulatory Visit: Payer: No Typology Code available for payment source | Admitting: Physical Therapy

## 2019-07-12 DIAGNOSIS — M25675 Stiffness of left foot, not elsewhere classified: Secondary | ICD-10-CM

## 2019-07-12 DIAGNOSIS — M25572 Pain in left ankle and joints of left foot: Secondary | ICD-10-CM

## 2019-07-12 DIAGNOSIS — M79675 Pain in left toe(s): Secondary | ICD-10-CM | POA: Diagnosis not present

## 2019-07-12 DIAGNOSIS — R2689 Other abnormalities of gait and mobility: Secondary | ICD-10-CM

## 2019-07-12 NOTE — Therapy (Signed)
[facility] PHYSICAL AND SPORTS MEDICINE [address] [place], [address] Phone: [phone]   Fax:  [phone]  Physical Therapy Treatment  Patient Details  Name: Jodi Ross MRN: [id number] Date of Birth: Nov 12, 1975 Referring Provider (PT): [place], [place] T, [place]   Encounter Date: 07/12/2019    Past Medical History:  Diagnosis Date  . Allergy   . Anemia, iron deficiency   . G6PD (glucose 6 phosphatase deficiency)   . G6PD deficiency     No past surgical history on file.  There were no vitals filed for this visit.   Therapeutic Exercise -Toe walks (knees ext) 3x 35ft with min cuing for full ROM with good carry over following - Heel raises from step with bilat knee flex 3x 10 with min cuing initially for proper technique with good cary over following             - SL RDL w/ 5# DB on foam pad 3x 8 with min cuing to prevent rotation with good carry over following             - Standing on foam LLE ball tosses/catch with PT with PT tossing ball outside BOS to encourage bilat reaching, occasional foot down for balance             - Backward lunge to hip flex on foam pad 3x 10 bilat for LLE SL and L foot push into max available GT ext SLS trials, best time 60sec each side  Gait Training: discussion about proper gait mechanics with education on compensation from other digits when she does not utilize GT ext for push off with demonstration. Education that this pain is okay, and on pain avoidance                             PT Short Term Goals - 06/26/19 1326      PT SHORT TERM GOAL #1   Title  Pt will be independent and compliant with her HEP.    Baseline  reports doing HEP no questions or concerns    Period  Weeks    Status  Achieved        PT Long Term Goals - 07/12/19 1752      PT LONG TERM GOAL #1   Title  Pt will score a 80/80 on the LEFS to demonstrate an improvement in disability and  function.    Baseline  06/26/19 74/80 (reports she "does not run", these are the only questions she put less tan "no difficuly"    Time  6    Period  Weeks    Status  On-going      PT LONG TERM GOAL #2   Title  Pt will be able to ambulate greater than 2 miles with 0/10 pain exacerbation to demonstrate improved function and quality of life.    Baseline  06/26/19 Able to walk as much as she wants without pain    Time  6    Period  Weeks    Status  Achieved      PT LONG TERM GOAL #3   Title  Pt will demonstrate equal to or less than 10% difference in passive great toe extension side-to-side to demonstrate significantly improved ROM.    Baseline  60 degrees left, 65 degrees right    Time  6    Period  Weeks    Status  Achieved  PT LONG TERM GOAL #4   Title  Patinet will demonstrate LLE balance for 54min on foam to demonstrate symmetrical  balance an ankle stability bilat    Baseline  60 bilat with ease    Time  4    Period  Weeks    Status  Achieved              Patient will benefit from skilled therapeutic intervention in order to improve the following deficits and impairments:     Visit Diagnosis: No diagnosis found.     Problem List Patient Active Problem List   Diagnosis Date Noted  . Left foot pain 02/20/2019  . 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 Alopecia 09/20/2013  . Menstrual cramps 11/11/2011  . History of miscarriage 02/04/2011  . ANEMIA-IRON DEFICIENCY 08/24/2010  . OTHER SPECIFIED ANEMIAS 08/24/2010  . Essential hypertension 08/24/2010  . ALLERGIC RHINITIS 08/24/2010   Shelton Silvas PT, DPT Shelton Silvas 07/12/2019, 5:55 PM  [facility] PHYSICAL AND SPORTS MEDICINE [address], [place], [address] Phone: [phone]   Fax:  [phone]  Name: Jodi Ross MRN: [id number] Date of Birth: [birth year]

## 2019-07-16 ENCOUNTER — Other Ambulatory Visit: Payer: Self-pay

## 2019-07-16 ENCOUNTER — Ambulatory Visit: Payer: No Typology Code available for payment source

## 2019-07-16 DIAGNOSIS — R2689 Other abnormalities of gait and mobility: Secondary | ICD-10-CM

## 2019-07-16 DIAGNOSIS — M79675 Pain in left toe(s): Secondary | ICD-10-CM

## 2019-07-16 DIAGNOSIS — M25572 Pain in left ankle and joints of left foot: Secondary | ICD-10-CM

## 2019-07-16 DIAGNOSIS — M25675 Stiffness of left foot, not elsewhere classified: Secondary | ICD-10-CM

## 2019-07-16 NOTE — Therapy (Signed)
[facility] [address] [place], [address] Phone: [phone]   Fax:  [phone]  Physical Therapy Treatment  Patient Details  Name: Jodi Ross MRN: [id number] Date of Birth: 1975/12/06 Referring Provider (PT): [place], [place] T, [place]   Encounter Date: 07/16/2019  PT End of Session - 07/16/19 1507    Visit Number  14    Number of Visits  20    Date for PT Re-Evaluation  07/24/19    PT Start Time  1455   PT starting late   PT Stop Time  1530    PT Time Calculation (min)  35 min    Activity Tolerance  Patient tolerated treatment well;No increased pain    Behavior During Therapy  WFL for tasks assessed/performed       Past Medical History:  Diagnosis Date  . Allergy   . Anemia, iron deficiency   . G6PD (glucose 6 phosphatase deficiency)   . G6PD deficiency     No past surgical history on file.  There were no vitals filed for this visit.  Subjective Assessment - 07/16/19 1455    Subjective  Pt reports things are going well in general.Still not having pain. She continues to attend to gait recommendations from prior session.    Pertinent History  Pt is a 43 y/o F who presents to PT with a primary medical dx of hallux valgus of the left foot s/p removal of fixator. Pt's main complaint is stiffness and lack of ROM in her left great toe. Pt's worst pain was 1/10 when walking greater than 2 miles or walking for prolonged time without her shoes and a current 0/10 pain. Pt did not state any other aggravating factors than walking greater than 2 miles. Pt's pertinent PMH includes controlled HTN, as her pertinent surgical hx includes a L bunionectomy that occurred last year, which she also reporting having PT for afterwards. 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 Pt has 2-3 STE and right handrail for her home entrance. No reported falls within  the last 6 months. Pt's hobbies include waking with her husband for 4-6 miles, to which she has not been able to do lately 2/2 to pain. Pt's goals are to get full motion in her left great toe.    Limitations  Walking    Currently in Pain?  No/denies         INTERVENTION THIS DATE:  -SLS bilat 10x30sec, VC to avoid leg contact -seated toe extension isometric BLE 15x5secH -cued same task with isolated arch activation and toe relaxation (pt unable to perceive abductor hallucis activity, unable to teach)  -SLS with medial tripod contact and long toe relaxation 20x15secH bilat -SLS contralateral cone taps 3x3 bilat -SLS on airex pad with soccer ball tossing hand to hand 2x16 bilat        PT Education - 07/16/19 1507    Education Details  Explained arch and windlass mechanism.    Person(s) Educated  Patient    Methods  Explanation;Demonstration;Tactile cues    Comprehension  Verbalized understanding;Returned demonstration;Verbal cues required;Tactile cues required;Need further instruction       PT Short Term Goals - 06/26/19 1326      PT SHORT TERM GOAL #1   Title  Pt will be independent and compliant with her HEP.    Baseline  reports doing HEP no  questions or concerns    Period  Weeks    Status  Achieved        PT Long Term Goals - 07/12/19 1752      PT LONG TERM GOAL #1   Title  Pt will score a 80/80 on the LEFS to demonstrate an improvement in disability and function.    Baseline  06/26/19 74/80 (reports she "does not run", these are the only questions she put less tan "no difficuly"    Time  6    Period  Weeks    Status  On-going      PT LONG TERM GOAL #2   Title  Pt will be able to ambulate greater than 2 miles with 0/10 pain exacerbation to demonstrate improved function and quality of life.    Baseline  06/26/19 Able to walk as much as she wants without pain    Time  6    Period  Weeks    Status  Achieved      PT LONG TERM GOAL #3   Title  Pt will demonstrate  equal to or less than 10% difference in passive great toe extension side-to-side to demonstrate significantly improved ROM.    Baseline  60 degrees left, 65 degrees right    Time  6    Period  Weeks    Status  Achieved      PT LONG TERM GOAL #4   Title  Patinet will demonstrate LLE balance for on foam to demonstrate symmetrical  balance an ankle stability bilat    Baseline  60 bilat with ease    Time  4    Period  Weeks    Status  Achieved            Plan - 07/16/19 1517    Clinical Impression Statement  Trying to engage patient regarding isolated arch activation, short foot strengthing, and moving away from ankle strategy. Pt reluctant to be barefoot in session and at home. Pt struggles with proprioception of arch. Noted difficulty dropping first MTP on left, likely related to hallux extension ROM deficit. Posting used in session during SLS balance under 1st MTP and hallux. Asked pt to bring work shoes next session.    Rehab Potential  Fair    PT Frequency  2x / week    PT Duration  6 weeks    PT Treatment/Interventions  ADLs/Self Care Home Management;Moist Heat;Gait training;Stair training;Functional mobility training;Patient/family education;Therapeutic activities;Therapeutic exercise;Balance training;Neuromuscular re-education;Manual techniques;Scar mobilization;Passive range of motion;Joint Manipulations    PT Next Visit Plan  MTP joint mobilization, therapeutic [place] to MTP capsule and plantar fascia prior to stretching; foot intrinsics, ankle DF ROM and strength    PT Home Exercise Plan  No updates this date; conside adding in more SLS or other foot intrinsics in the future.    Consulted and Agree with Plan of Care  Patient       Patient will benefit from skilled therapeutic intervention in order to improve the following deficits and impairments:  Abnormal gait, Decreased range of motion, Difficulty walking, Decreased activity tolerance, Decreased balance, Decreased strength,  Decreased mobility, Improper body mechanics, Hypomobility, Decreased scar mobility, Impaired vision/preception  Visit Diagnosis: Pain in left toe(s)  Pain in left ankle and joints of left foot  Stiffness of left foot, not elsewhere classified  Other abnormalities of gait and mobility     Problem List Patient Active Problem List   Diagnosis Date Noted  . Left foot pain  02/20/2019  . 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 Alopecia 09/20/2013  . Menstrual cramps 11/11/2011  . History of miscarriage 02/04/2011  . ANEMIA-IRON DEFICIENCY 08/24/2010  . OTHER SPECIFIED ANEMIAS 08/24/2010  . Essential hypertension 08/24/2010  . ALLERGIC RHINITIS 08/24/2010   3:32 PM, 07/16/19 Rosamaria Lints, PT, DPT Physical Therapist - [place] [phone] (Office)    Rayman Petrosian C 07/16/2019, 3:23 PM  [facility] PH[facility] AND SPORTS MEDICINE [address], [place], [address] Phone: [phone]   Fax:  [phone]  Name: LASHANTE GIBBON MRN: [id number] Date of Birth: Jul 17, 1976

## 2019-07-17 ENCOUNTER — Other Ambulatory Visit (HOSPITAL_COMMUNITY)
Admission: RE | Admit: 2019-07-17 | Discharge: 2019-07-17 | Disposition: A | Discharge status: Home / Self Care | Payer: No Typology Code available for payment source | Source: Ambulatory Visit | Attending: Nurse Practitioner | Admitting: Nurse Practitioner

## 2019-07-17 DIAGNOSIS — Z124 Encounter for screening for malignant neoplasm of cervix: Secondary | ICD-10-CM | POA: Diagnosis present

## 2019-07-18 ENCOUNTER — Encounter: Payer: No Typology Code available for payment source | Admitting: Physical Therapy

## 2019-07-23 ENCOUNTER — Ambulatory Visit: Payer: No Typology Code available for payment source | Attending: Podiatry

## 2019-07-23 ENCOUNTER — Encounter: Payer: Self-pay | Admitting: Physical Therapy

## 2019-07-23 ENCOUNTER — Other Ambulatory Visit: Payer: Self-pay

## 2019-07-23 DIAGNOSIS — M79675 Pain in left toe(s): Secondary | ICD-10-CM | POA: Diagnosis not present

## 2019-07-23 DIAGNOSIS — M25675 Stiffness of left foot, not elsewhere classified: Secondary | ICD-10-CM | POA: Insufficient documentation

## 2019-07-23 DIAGNOSIS — R2689 Other abnormalities of gait and mobility: Secondary | ICD-10-CM | POA: Diagnosis present

## 2019-07-23 DIAGNOSIS — M25572 Pain in left ankle and joints of left foot: Secondary | ICD-10-CM | POA: Diagnosis present

## 2019-07-23 NOTE — Therapy (Signed)
[facility] [address] [place], [address] Phone: [phone]   Fax:  [phone]  Physical Therapy Treatment  Patient Details  Name: Jodi Ross MRN: [id number] Date of Birth: December 07, 1975 Referring Provider (PT): [place], [place], [place]   Encounter Date: 07/23/2019  PT End of Session - 07/23/19 1522    Visit Number  15    Number of Visits  20    Date for PT Re-Evaluation  07/24/19    PT Start Time  1431    PT Stop Time  1516    PT Time Calculation (min)  45 min    Activity Tolerance  Patient tolerated treatment well;No increased pain    Behavior During Therapy  WFL for tasks assessed/performed       Past Medical History:  Diagnosis Date  . Allergy   . Anemia, iron deficiency   . G6PD (glucose 6 phosphatase deficiency)   . G6PD deficiency     History reviewed. No pertinent surgical history.  There were no vitals filed for this visit.  Subjective Assessment - 07/23/19 1436    Subjective  Pt reports she is generally pleased with her progress but notes her hallux mobility remains limited. Pt reports she conitnues to not have any pain limitations.    Pertinent History  Pt is a 43 y/o F who presents to PT with a primary medical dx of hallux valgus of the left foot s/p removal of fixator. Pt's main complaint is stiffness and lack of ROM in her left great toe. Pt's worst pain was 1/10 when walking greater than 2 miles or walking for prolonged time without her shoes and a current 0/10 pain. Pt did not state any other aggravating factors than walking greater than 2 miles. Pt's pertinent PMH includes controlled HTN, as her pertinent surgical hx includes a L bunionectomy that occurred last year, which she also reporting having PT for afterwards. 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 Pt has 2-3 STE and right handrail for her home entrance. No  reported falls within the last 6 months. Pt's hobbies include waking with her husband for 4-6 miles, to which she has not been able to do lately 2/2 to pain. Pt's goals are to get full motion in her left great toe.    Limitations  Walking    How long can you sit comfortably?  n/a    How long can you stand comfortably?  n/a    How long can you walk comfortably?  Not limited; has not been AMB extensively    Diagnostic tests  see chart for imaging results    Patient Stated Goals  To attain full left great toe ROM    Currently in Pain?  No/denies       ROM Assessment this date:  Right Ankle DF: 8 degrees  Right Ank;le PF: 58 degrees  Left ankle DF: 14 degrees Left ankle PF: 54 degrees  Right hallux extension: 60 degrees Left hallux extension:  40 degrees  Right Hallux MTP:  45 degrees Left Hallux MTP: 30 degrees  SLS on Foam: >60sec bilat barefoot on airex pad. SLS firm: >60sec bilat, improved navicular control with pt's orthotics.   *education on shoe sizing and continued HEP independence.       PT Short Term Goals - 07/23/19 1453      PT SHORT TERM  GOAL #1   Title  Pt will be independent and compliant with her HEP.    Baseline  reports doing HEP no questions or concerns    Time  2    Period  Weeks    Status  Achieved    Target Date  05/24/19        PT Long Term Goals - 07/23/19 1453      PT LONG TERM GOAL #1   Title  Pt will score a 80/80 on the LEFS to demonstrate an improvement in disability and function.    Baseline  06/26/19 74/80 (reports she "does not run", these are the only questions she put less tan "no difficuly"    Time  6    Period  Weeks    Status  On-going    Target Date  06/21/19      PT LONG TERM GOAL #2   Title  Pt will be able to ambulate greater than 2 miles with 0/10 pain exacerbation to demonstrate improved function and quality of life.    Baseline  06/26/19 Able to walk as much as she wants without pain    Time  6    Period  Weeks    Status   Achieved    Target Date  06/21/19      PT LONG TERM GOAL #3   Title  Pt will demonstrate equal to or less than 10% difference in passive great toe extension side-to-side to demonstrate significantly improved ROM.    Baseline  ~30% difference on 10/5    Time  6    Period  Weeks    Status  On-going    Target Date  06/21/19      PT LONG TERM GOAL #4   Title  Patinet will demonstrate LLE balance for on foam to demonstrate symmetrical  balance an ankle stability bilat    Baseline  60 bilat with ease    Time  4    Period  Weeks    Status  Achieved    Target Date  07/24/19            Plan - 07/23/19 1522    Clinical Impression Statement  Reassessment done this visit at end of 4 weeks certification. Pt has met or made progress toward all treatment goals, but remains limited in 1st MTP mobility, with demonstrated compensation in IP joint extension. Pt conitnues to deny any incidence of pain. Function for leisure and work remain unlimited and unihibited by Left foot. ROM improvement in Left MTP flexion extension have largely been unchanged since evaluation, author suspects some component of arthrofibrosis. Pt educated on proper footwear selection and sizing emphasizing sizing based on arch length not total foot length, and seeking shoes that allow sufficient toe box width and height. also recommending some toe spring component in shoes to allow for assistance with toe rocker in gait. Chartered loss adjuster educates patient on curren tfindings and low liklihood of further ROM improvements with PT. Pt is well versed in stretches and foot strength and encouraged to continue with mobility work independently after DC.    Personal Factors and Comorbidities  Comorbidity 1;Past/Current Experience    Comorbidities  HTN, L bunionectomy    Examination-Activity Limitations  Locomotion Level;Squat    Examination-Participation Restrictions  Community Activity    Stability/Clinical Decision Making  Evolving/Moderate  complexity    Rehab Potential  Fair    PT Frequency  2x / week    PT Duration  6  weeks    PT Treatment/Interventions  ADLs/Self Care Home Management;Moist Heat;Gait training;Stair training;Functional mobility training;Patient/family education;Therapeutic activities;Therapeutic exercise;Balance training;Neuromuscular re-education;Manual techniques;Scar mobilization;Passive range of motion;Joint Manipulations    PT Next Visit Plan  Pt DC from PT services this date.    PT Home Exercise Plan  No updates this date; conside adding in more SLS or other foot intrinsics in the future.    Consulted and Agree with Plan of Care  Patient       Patient will benefit from skilled therapeutic intervention in order to improve the following deficits and impairments:  Abnormal gait, Decreased range of motion, Difficulty walking, Decreased activity tolerance, Decreased balance, Decreased strength, Decreased mobility, Improper body mechanics, Hypomobility, Decreased scar mobility, Impaired vision/preception  Visit Diagnosis: Pain in left toe(s)  Pain in left ankle and joints of left foot  Stiffness of left foot, not elsewhere classified  Other abnormalities of gait and mobility     Problem List Patient Active Problem List   Diagnosis Date Noted  . Left foot pain 02/20/2019  . 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 Alopecia 09/20/2013  . Menstrual cramps 11/11/2011  . History of miscarriage 02/04/2011  . ANEMIA-IRON DEFICIENCY 08/24/2010  . OTHER SPECIFIED ANEMIAS 08/24/2010  . Essential hypertension 08/24/2010  . ALLERGIC RHINITIS 08/24/2010    3:30 PM, 07/23/19 Rosamaria Lints, PT, DPT Physical Therapist - [place] [phone] (Office)    Rosamaria Lints 07/23/2019, 3:28 PM  [place] [facility] PHYSICAL AND SPORTS MEDICINE [address] [place], [address] Phone: [phone]   Fax:  [phone]  Name: Jodi Ross MRN: [id number] Date of Birth:  January 19, 1976

## 2019-07-30 ENCOUNTER — Ambulatory Visit: Payer: No Typology Code available for payment source | Admitting: Physical Therapy

## 2019-09-24 ENCOUNTER — Encounter: Payer: Self-pay | Admitting: Family Medicine

## 2019-09-24 ENCOUNTER — Other Ambulatory Visit: Payer: Self-pay | Admitting: Family Medicine

## 2019-09-24 MED ORDER — PANTOPRAZOLE SODIUM 40 MG PO TBEC: 40.0000 mg | DELAYED_RELEASE_TABLET | Freq: Every day | ORAL | 3 refills | Status: DC

## 2020-02-09 IMAGING — MG DIGITAL SCREENING BILATERAL MAMMOGRAM WITH TOMO AND CAD
8 series · 8 of 24 positions shown · non-contrast
Comparison: Previous exam(s).

CLINICAL DATA: Screening.

EXAM:
DIGITAL SCREENING BILATERAL MAMMOGRAM WITH TOMO AND CAD

[R MLO synth-2D]
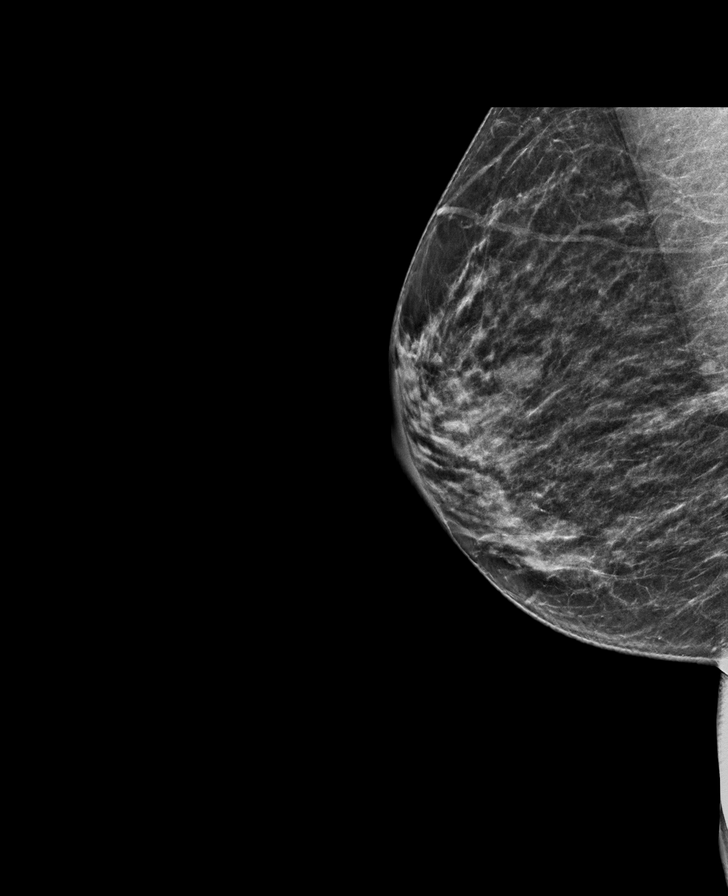

[L MLO synth-2D]
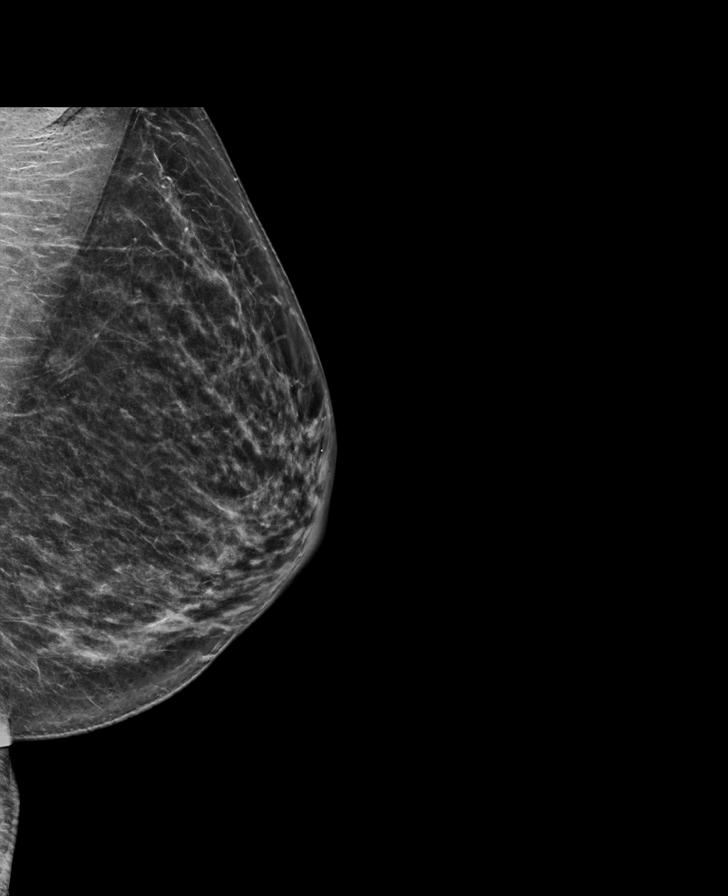

[R CC synth-2D]
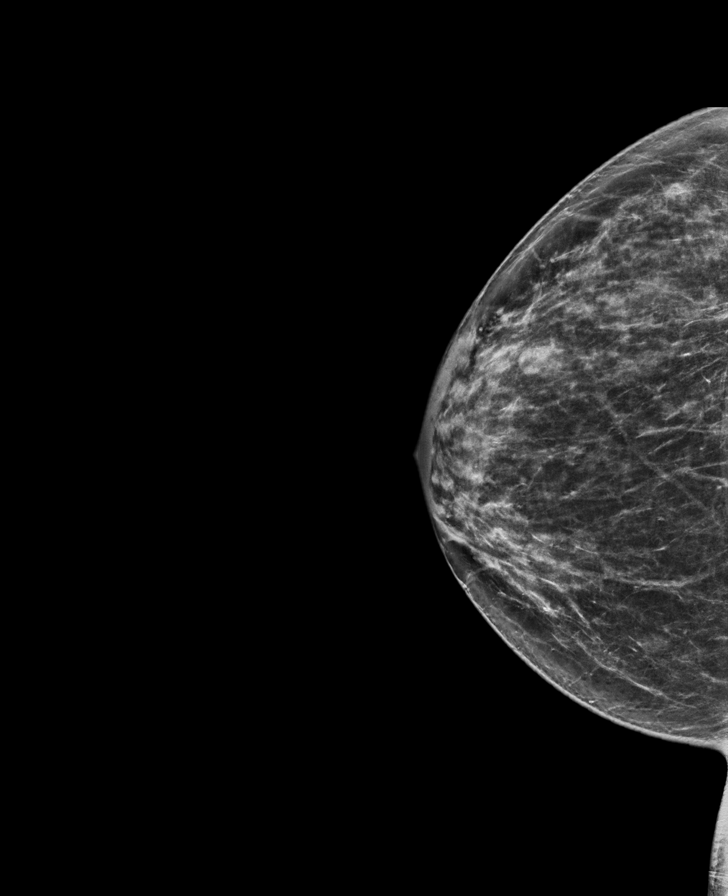

[L CC synth-2D]
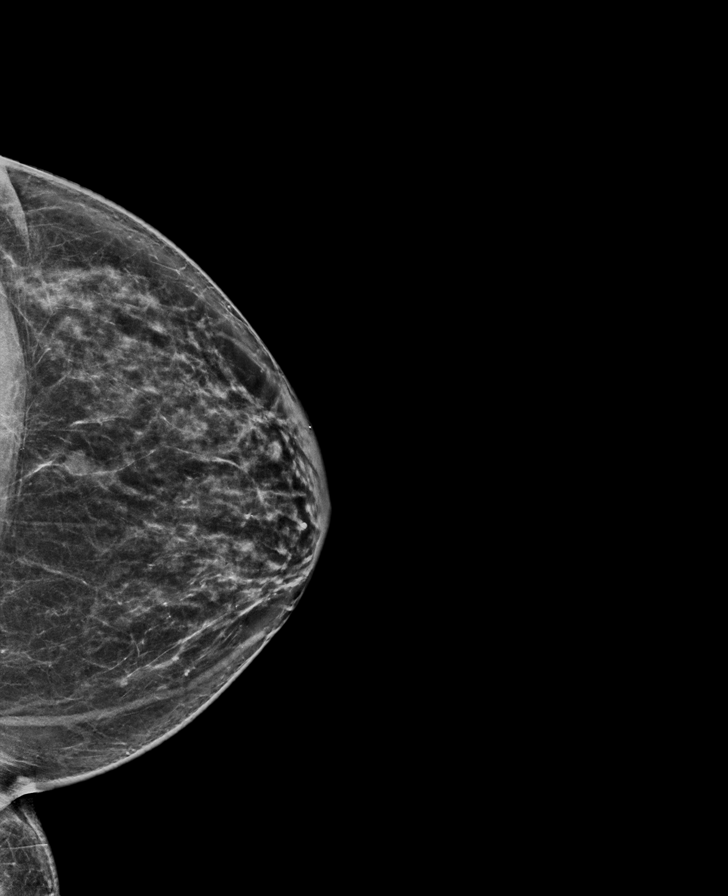

[R MLO tomo · tomo slice 31/61.0]
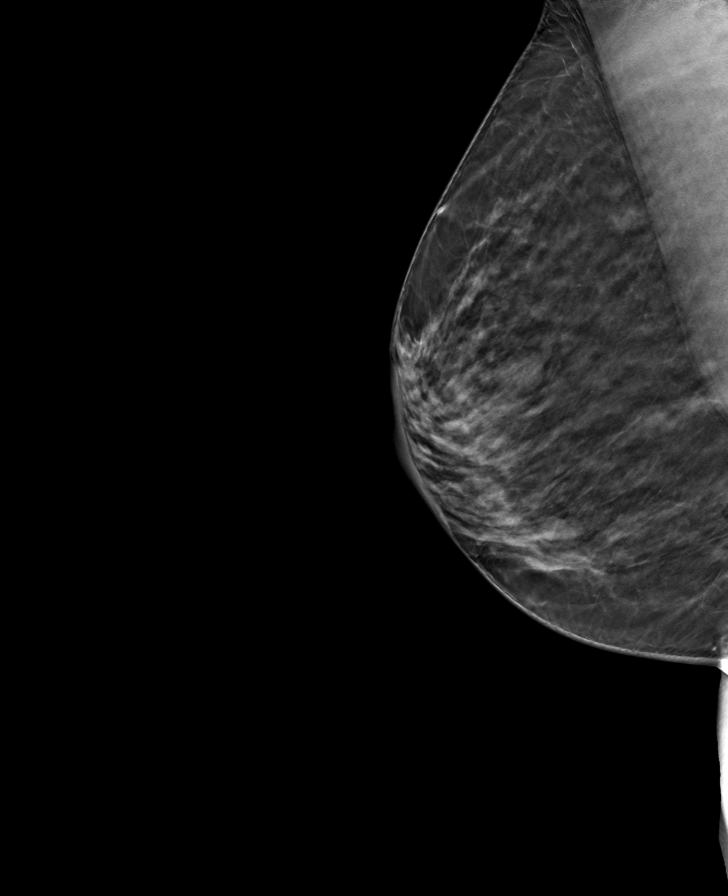

[L MLO tomo · tomo slice 35/70.0]
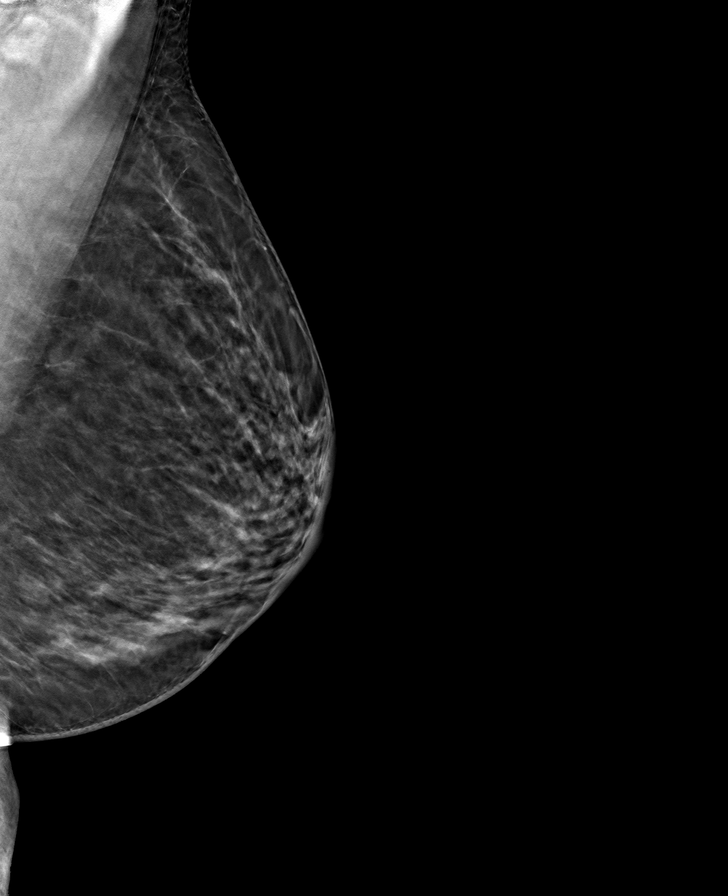

[L CC tomo · tomo slice 38/75.0]
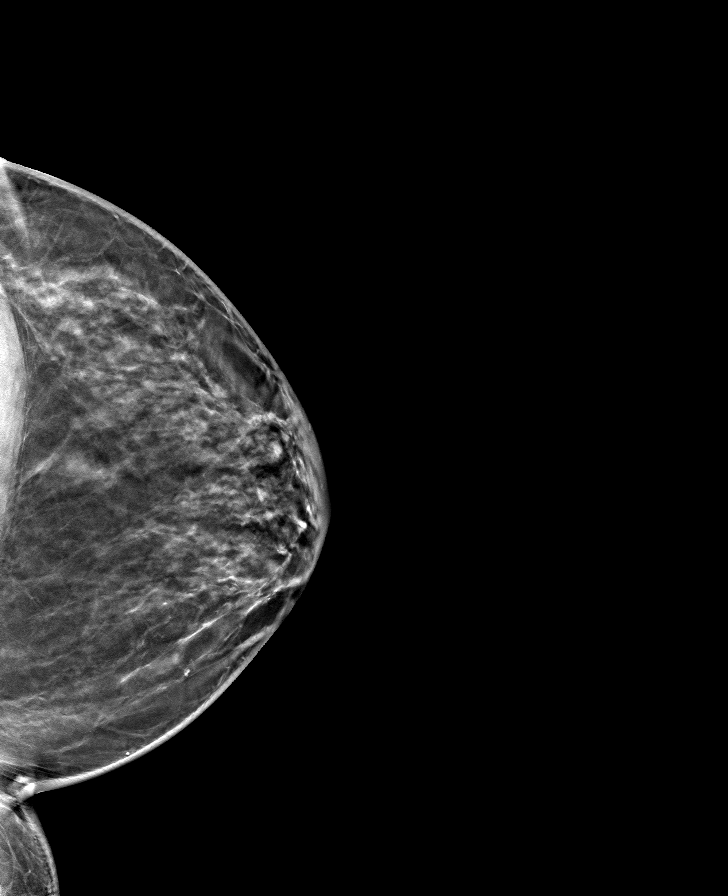

[R CC tomo · tomo slice 37/72.0]
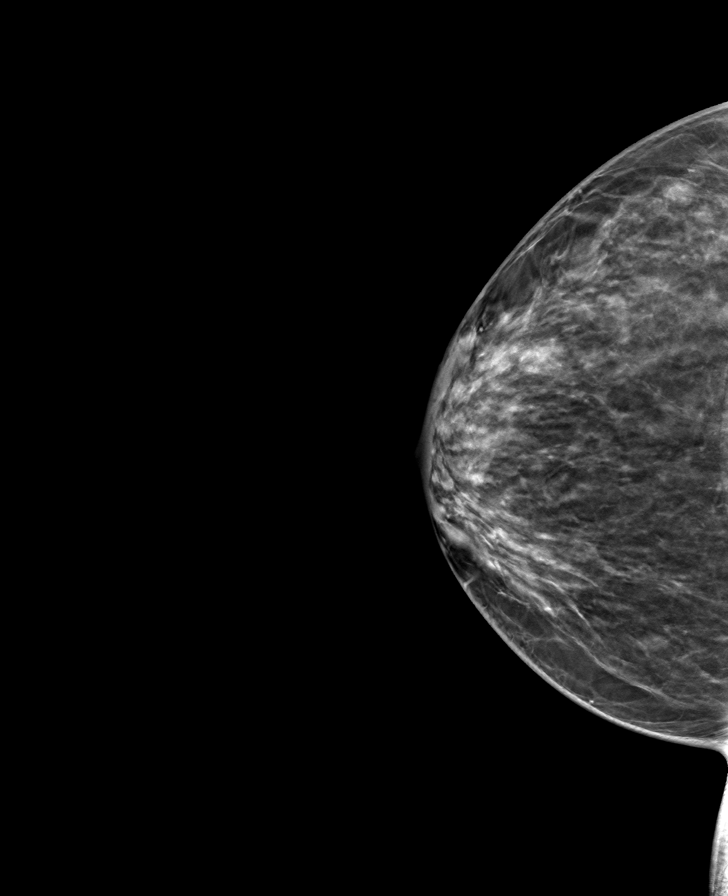

[8 of 24 positions shown; findings below may reference images not displayed]

ACR Breast Density Category b: There are scattered areas of
fibroglandular density.
FINDINGS: In the right breast, a possible mass warrants further evaluation. In
the left breast, no findings suspicious for malignancy. Images were
processed with CAD.
IMPRESSION: Further evaluation is suggested for possible mass in the right
breast.

RECOMMENDATION:
Diagnostic mammogram and possibly ultrasound of the right breast.
(Code:T1-A-550)

The patient will be contacted regarding the findings, and additional
imaging will be scheduled.

BI-RADS CATEGORY  0: Incomplete. Need additional imaging evaluation
and/or prior mammograms for comparison.

## 2020-04-03 ENCOUNTER — Encounter: Payer: Self-pay | Admitting: Internal Medicine

## 2020-04-03 ENCOUNTER — Other Ambulatory Visit: Payer: Self-pay | Admitting: Internal Medicine

## 2020-04-03 ENCOUNTER — Other Ambulatory Visit: Payer: Self-pay

## 2020-04-03 ENCOUNTER — Ambulatory Visit (INDEPENDENT_AMBULATORY_CARE_PROVIDER_SITE_OTHER): Payer: No Typology Code available for payment source | Admitting: Internal Medicine

## 2020-04-03 DIAGNOSIS — K219 Gastro-esophageal reflux disease without esophagitis: Secondary | ICD-10-CM

## 2020-04-03 DIAGNOSIS — J301 Allergic rhinitis due to pollen: Secondary | ICD-10-CM

## 2020-04-03 DIAGNOSIS — I10 Essential (primary) hypertension: Secondary | ICD-10-CM

## 2020-04-03 MED ORDER — ALLERGY RELIEF/NASAL DECONGEST 10-240 MG PO TB24: 1.0000 | ORAL_TABLET | Freq: Every day | ORAL | 3 refills | Status: DC

## 2020-04-03 MED ORDER — AMLODIPINE BESYLATE 10 MG PO TABS: 10.0000 mg | ORAL_TABLET | Freq: Every day | ORAL | 3 refills | Status: DC

## 2020-04-03 NOTE — Assessment & Plan Note (Signed)
Controlled on Amlodipine, refilled today

## 2020-04-03 NOTE — Patient Instructions (Signed)

## 2020-04-03 NOTE — Assessment & Plan Note (Signed)
Claritin-D refilled today  continue Flonase as needed

## 2020-04-03 NOTE — Progress Notes (Signed)
HPI  Patient presents the clinic today to establish care and for management of the conditions listed below.  GERD: Triggered by tomato based foods.  She denies breakthrough on Pantoprazole as needed.  There is no upper GI on file.  HTN: Her BP today is 130/78.  She is taking Amlodipine as prescribed.  There is no ECG on file.  Seasonal Allergies: Worsening with age. She is taking Claritin and Flonase OTC as needed with good relief of symptoms.   Flu: 07/2018 Tetanus: 07/2018 Covid: never Pap Smear: 2020, [facility] screening: as needed Dentist: annually  Past Medical History:  Diagnosis Date  . Allergy   . Anemia, iron deficiency   . G6PD (glucose 6 phosphatase deficiency)   . G6PD deficiency     Current Outpatient Medications  Medication Sig Dispense Refill  . amLODipine (NORVASC) 10 MG tablet Take 1 tablet (10 mg total) by mouth daily. 90 tablet 0  . etonogestrel-ethinyl estradiol (NUVARING) 0.12-0.015 MG/24HR vaginal ring Place 1 each vaginally every 28 (twenty-eight) days. Insert vaginally and leave in place for 3 consecutive weeks, then remove for 1 week.    . fluticasone (FLONASE) 50 MCG/ACT nasal spray Place 2 sprays into both nostrils daily. (Patient not taking: Reported on 05/10/2019) 16 g 0  . ibuprofen (ADVIL) 200 MG tablet Take 200 mg by mouth every 6 (six) hours as needed.    . loratadine-pseudoephedrine (ALLERGY RELIEF/NASAL DECONGEST) 10-240 MG 24 hr tablet Take 1 tablet by mouth daily. 90 tablet 3  . ondansetron (ZOFRAN) 4 MG tablet Take 1 tablet (4 mg total) by mouth every 8 (eight) hours as needed for nausea or vomiting. (Patient not taking: Reported on 02/20/2019) 12 tablet 0  . ondansetron (ZOFRAN) 4 MG tablet Take 1 tablet (4 mg total) by mouth every 8 (eight) hours as needed for nausea or vomiting. (Patient not taking: Reported on 05/10/2019) 20 tablet 0  . pantoprazole (PROTONIX) 40 MG tablet Take 1 tablet (40 mg total) by mouth daily. 30 tablet 3   No current  facility-administered medications for this visit.    Allergies  Allergen Reactions  . Aspirin   . Sulfonamide Derivatives     Family History  Problem Relation Age of Onset  . Lupus Mother   . Hypertension Father     Social History   Socioeconomic History  . Marital status: Married    Spouse name: Not on file  . Number of children: 2  . Years of education: Not on file  . Highest education level: Not on file  Occupational History  . Occupation: Teacher, adult education: [employer]  Tobacco Use  . Smoking status: Never Smoker  . Smokeless tobacco: Never Used  Substance and Sexual Activity  . Alcohol use: No  . Drug use: No  . Sexual activity: Not on file  Other Topics Concern  . Not on file  Social History Narrative  . Not on file   Social Determinants of Health   Financial Resource Strain:   . Difficulty of Paying Living Expenses:   Food Insecurity:   . Worried About Programme researcher, broadcasting/film/video in the Last Year:   . Barista in the Last Year:   Transportation Needs:   . Freight forwarder (Medical):   Marland Kitchen Lack of Transportation (Non-Medical):   Physical Activity:   . Days of Exercise per Week:   . Minutes of Exercise per Session:   Stress:   . Feeling of Stress :  Social Connections:   . Frequency of Communication with Friends and Family:   . Frequency of Social Gatherings with Friends and Family:   . Attends Religious Services:   . Active Member of Clubs or Organizations:   . Attends Archivist Meetings:   Marland Kitchen Marital Status:   Intimate Partner Violence:   . Fear of Current or Ex-Partner:   . Emotionally Abused:   Marland Kitchen Physically Abused:   . Sexually Abused:     ROS:  Constitutional: Denies fever, malaise, fatigue, headache or abrupt weight changes.  HEENT: Denies eye pain, eye redness, ear pain, ringing in the ears, wax buildup, runny nose, nasal congestion, bloody nose, or sore throat. Respiratory: Denies difficulty breathing,  shortness of breath, cough or sputum production.   Cardiovascular: Denies chest pain, chest tightness, palpitations or swelling in the hands or feet.  Gastrointestinal: Pt reports intermittent reflux. Denies abdominal pain, bloating, constipation, diarrhea or blood in the stool.  GU: Denies frequency, urgency, pain with urination, blood in urine, odor or discharge. Musculoskeletal: Denies decrease in range of motion, difficulty with gait, muscle pain or joint pain and swelling.  Skin: Denies redness, rashes, lesions or ulcercations.  Neurological: Denies dizziness, difficulty with memory, difficulty with speech or problems with balance and coordination.  Psych: Denies anxiety, depression, SI/HI.  No other specific complaints in a complete review of systems (except as listed in HPI above).  PE:  BP 130/78   Pulse 79   Temp 98.6 F (37 C) (Temporal)   Ht 5' 3.75" (1.619 m)   Wt 155 lb (70.3 kg)   SpO2 98%   BMI 26.81 kg/m   Wt Readings from Last 3 Encounters:  03/25/19 159 lb (72.1 kg)  02/20/19 149 lb (67.6 kg)  11/21/18 150 lb (68 kg)    General: Appears her stated age, well developed, well nourished in NAD. Skin: Dry and intact. HEENT: Head: normal shape and size; Eyes: EOMs intact;  Cardiovascular: Normal rate and rhythm. S1,S2 noted.  No murmur, rubs or gallops noted.  Pulmonary/Chest: Normal effort and positive vesicular breath sounds. No respiratory distress. No wheezes, rales or ronchi noted.  Musculoskeletal: . No difficulty with gait.  Neurological: Alert and oriented.  Psychiatric: Mood and affect normal. Behavior is normal. Judgment and thought content normal.     BMET    Component Value Date/Time   NA 138 02/20/2018 1115   K 4.8 02/20/2018 1115   CL 105 02/20/2018 1115   CO2 26 02/20/2018 1115   GLUCOSE 87 02/20/2018 1115   BUN 11 02/20/2018 1115   CREATININE 0.80 02/20/2018 1115   CALCIUM 9.8 02/20/2018 1115   GFRNONAA 125.24 08/24/2010 1135    Lipid  Panel     Component Value Date/Time   CHOL 193 02/20/2018 1115   TRIG 73.0 02/20/2018 1115   HDL 88.10 02/20/2018 1115   CHOLHDL 2 02/20/2018 1115   VLDL 14.6 02/20/2018 1115   LDLCALC 91 02/20/2018 1115    CBC    Component Value Date/Time   WBC 8.4 02/20/2018 1115   RBC 3.86 (L) 02/20/2018 1115   HGB 11.6 (L) 02/20/2018 1115   HCT 35.8 (L) 02/20/2018 1115   PLT 286.0 02/20/2018 1115   MCV 92.9 02/20/2018 1115   MCH 29.4 01/18/2011 1839   MCHC 32.5 02/20/2018 1115   RDW 12.4 02/20/2018 1115   LYMPHSABS 2.7 02/20/2018 1115   MONOABS 0.4 02/20/2018 1115   EOSABS 0.1 02/20/2018 1115   BASOSABS 0.0 02/20/2018 1115  Hgb A1C No results found for: HGBA1C   Assessment and Plan:   Nicki Reaper, NP This visit occurred during the SARS-CoV-2 public health emergency.  Safety protocols were in place, including screening questions prior to the visit, additional usage of staff PPE, and extensive cleaning of exam room while observing appropriate contact time as indicated for disinfecting solutions.

## 2020-04-03 NOTE — Assessment & Plan Note (Signed)
Continue Pantoprazole as needed

## 2020-05-12 ENCOUNTER — Encounter: Payer: No Typology Code available for payment source | Admitting: Internal Medicine

## 2020-08-07 ENCOUNTER — Encounter: Payer: Self-pay | Admitting: Internal Medicine

## 2020-08-07 ENCOUNTER — Other Ambulatory Visit: Payer: Self-pay

## 2020-08-07 ENCOUNTER — Ambulatory Visit (INDEPENDENT_AMBULATORY_CARE_PROVIDER_SITE_OTHER): Payer: No Typology Code available for payment source | Admitting: Internal Medicine

## 2020-08-07 ENCOUNTER — Other Ambulatory Visit: Payer: Self-pay | Admitting: Internal Medicine

## 2020-08-07 VITALS — BP 102/62 | HR 91 | Temp 97.6°F | Resp 16 | Ht 63.75 in | Wt 160.0 lb

## 2020-08-07 DIAGNOSIS — Z0000 Encounter for general adult medical examination without abnormal findings: Secondary | ICD-10-CM

## 2020-08-07 DIAGNOSIS — Z1231 Encounter for screening mammogram for malignant neoplasm of breast: Secondary | ICD-10-CM

## 2020-08-07 LAB — LIPID PANEL
Cholesterol: 213 mg/dL — ABNORMAL HIGH (ref 0–200)
HDL: 107.4 mg/dL (ref >39.00)
LDL Cholesterol: 94 mg/dL (ref 0–99)
NonHDL: 105.43
Total CHOL/HDL Ratio: 2
Triglycerides: 58 mg/dL (ref 0.0–149.0)
VLDL: 11.6 mg/dL (ref 0.0–40.0)

## 2020-08-07 LAB — CBC
HCT: 34.8 % — ABNORMAL LOW (ref 36.0–46.0)
Hemoglobin: 11.3 g/dL — ABNORMAL LOW (ref 12.0–15.0)
MCHC: 32.6 g/dL (ref 30.0–36.0)
MCV: 92.7 fl (ref 78.0–100.0)
Platelets: 299 10*3/uL (ref 150.0–400.0)
RBC: 3.76 Mil/uL — ABNORMAL LOW (ref 3.87–5.11)
RDW: 12.7 % (ref 11.5–15.5)
WBC: 7.5 10*3/uL (ref 4.0–10.5)

## 2020-08-07 LAB — COMPREHENSIVE METABOLIC PANEL
ALT: 21 U/L (ref 0–35)
AST: 21 U/L (ref 0–37)
Albumin: 3.8 g/dL (ref 3.5–5.2)
Alkaline Phosphatase: 29 U/L — ABNORMAL LOW (ref 39–117)
BUN: 10 mg/dL (ref 6–23)
CO2: 27 mEq/L (ref 19–32)
Calcium: 10.1 mg/dL (ref 8.4–10.5)
Chloride: 104 mEq/L (ref 96–112)
Creatinine, Ser: 0.83 mg/dL (ref 0.40–1.20)
GFR: 86.01 mL/min (ref >60.00)
Glucose, Bld: 84 mg/dL (ref 70–99)
Potassium: 3.5 mEq/L (ref 3.5–5.1)
Sodium: 137 mEq/L (ref 135–145)
Total Bilirubin: 0.6 mg/dL (ref 0.2–1.2)
Total Protein: 7.2 g/dL (ref 6.0–8.3)

## 2020-08-07 LAB — HEMOGLOBIN A1C: Hgb A1c MFr Bld: 4.5 % — ABNORMAL LOW (ref 4.6–6.5)

## 2020-08-07 LAB — VITAMIN D 25 HYDROXY (VIT D DEFICIENCY, FRACTURES): VITD: 21.5 ng/mL — ABNORMAL LOW (ref 30.00–100.00)

## 2020-08-07 MED ORDER — ETONOGESTREL-ETHINYL ESTRADIOL 0.12-0.015 MG/24HR VA RING: 1.0000 | VAGINAL_RING | VAGINAL | 3 refills | Status: DC

## 2020-08-07 NOTE — Patient Instructions (Signed)
Health Maintenance, Female Adopting a healthy lifestyle and getting preventive care are important in promoting health and wellness. Ask your health care provider about:  The right schedule for you to have regular tests and exams.  Things you can do on your own to prevent diseases and keep yourself healthy. What should I know about diet, weight, and exercise? Eat a healthy diet   Eat a diet that includes plenty of vegetables, fruits, low-fat dairy products, and lean protein.  Do not eat a lot of foods that are high in solid fats, added sugars, or sodium. Maintain a healthy weight Body mass index (BMI) is used to identify weight problems. It estimates body fat based on height and weight. Your health care provider can help determine your BMI and help you achieve or maintain a healthy weight. Get regular exercise Get regular exercise. This is one of the most important things you can do for your health. Most adults should:  Exercise for at least 150 minutes each week. The exercise should increase your heart rate and make you sweat (moderate-intensity exercise).  Do strengthening exercises at least twice a week. This is in addition to the moderate-intensity exercise.  Spend less time sitting. Even light physical activity can be beneficial. Watch cholesterol and blood lipids Have your blood tested for lipids and cholesterol at 44 years of age, then have this test every 5 years. Have your cholesterol levels checked more often if:  Your lipid or cholesterol levels are high.  You are older than 44 years of age.  You are at high risk for heart disease. What should I know about cancer screening? Depending on your health history and family history, you may need to have cancer screening at various ages. This may include screening for:  Breast cancer.  Cervical cancer.  Colorectal cancer.  Skin cancer.  Lung cancer. What should I know about heart disease, diabetes, and high blood  pressure? Blood pressure and heart disease  High blood pressure causes heart disease and increases the risk of stroke. This is more likely to develop in people who have high blood pressure readings, are of African descent, or are overweight.  Have your blood pressure checked: ? Every 3-5 years if you are 18-39 years of age. ? Every year if you are 40 years old or older. Diabetes Have regular diabetes screenings. This checks your fasting blood sugar level. Have the screening done:  Once every three years after age 40 if you are at a normal weight and have a low risk for diabetes.  More often and at a younger age if you are overweight or have a high risk for diabetes. What should I know about preventing infection? Hepatitis B If you have a higher risk for hepatitis B, you should be screened for this virus. Talk with your health care provider to find out if you are at risk for hepatitis B infection. Hepatitis C Testing is recommended for:  Everyone born from 1945 through 1965.  Anyone with known risk factors for hepatitis C. Sexually transmitted infections (STIs)  Get screened for STIs, including gonorrhea and chlamydia, if: ? You are sexually active and are younger than 44 years of age. ? You are older than 44 years of age and your health care provider tells you that you are at risk for this type of infection. ? Your sexual activity has changed since you were last screened, and you are at increased risk for chlamydia or gonorrhea. Ask your health care provider if   you are at risk.  Ask your health care provider about whether you are at high risk for HIV. Your health care provider may recommend a prescription medicine to help prevent HIV infection. If you choose to take medicine to prevent HIV, you should first get tested for HIV. You should then be tested every 3 months for as long as you are taking the medicine. Pregnancy  If you are about to stop having your period (premenopausal) and  you may become pregnant, seek counseling before you get pregnant.  Take 400 to 800 micrograms (mcg) of folic acid every day if you become pregnant.  Ask for birth control (contraception) if you want to prevent pregnancy. Osteoporosis and menopause Osteoporosis is a disease in which the bones lose minerals and strength with aging. This can result in bone fractures. If you are 65 years old or older, or if you are at risk for osteoporosis and fractures, ask your health care provider if you should:  Be screened for bone loss.  Take a calcium or vitamin D supplement to lower your risk of fractures.  Be given hormone replacement therapy (HRT) to treat symptoms of menopause. Follow these instructions at home: Lifestyle  Do not use any products that contain nicotine or tobacco, such as cigarettes, e-cigarettes, and chewing tobacco. If you need help quitting, ask your health care provider.  Do not use street drugs.  Do not share needles.  Ask your health care provider for help if you need support or information about quitting drugs. Alcohol use  Do not drink alcohol if: ? Your health care provider tells you not to drink. ? You are pregnant, may be pregnant, or are planning to become pregnant.  If you drink alcohol: ? Limit how much you use to 0-1 drink a day. ? Limit intake if you are breastfeeding.  Be aware of how much alcohol is in your drink. In the [place] one drink equals one 12 oz bottle of beer (355 mL), one 5 oz glass of wine (148 mL), or one 1 oz glass of hard liquor (44 mL). General instructions  Schedule regular health, dental, and eye exams.  Stay current with your vaccines.  Tell your health care provider if: ? You often feel depressed. ? You have ever been abused or do not feel safe at home. Summary  Adopting a healthy lifestyle and getting preventive care are important in promoting health and wellness.  Follow your health care provider's instructions about healthy  diet, exercising, and getting tested or screened for diseases.  Follow your health care provider's instructions on monitoring your cholesterol and blood pressure. This information is not intended to replace advice given to you by your health care provider. Make sure you discuss any questions you have with your health care provider. Document Revised: 09/27/2018 Document Reviewed: 09/27/2018 Elsevier Patient Education  2020 Elsevier Inc.  

## 2020-08-07 NOTE — Progress Notes (Signed)
HPI  Pt presents to the clinic today for her annual exam.  Flu: 07/2020 Tetanus: 07/2018 Covid: Pfizer Pap Smear: 05/2019, [facility] Sprang Mammogram: 09/2018 Vision Screening: annually Dentist: biannually  Diet: She does eat meat. She consumes fruits and veggies. She does eat fried foods. She drinks mostly water. Exercise: Walking or Elliptical.  Past Medical History:  Diagnosis Date  . Allergy   . Anemia, iron deficiency   . G6PD (glucose 6 phosphatase deficiency)   . G6PD deficiency     Current Outpatient Medications  Medication Sig Dispense Refill  . amLODipine (NORVASC) 10 MG tablet Take 1 tablet (10 mg total) by mouth daily. 90 tablet 3  . etonogestrel-ethinyl estradiol (NUVARING) 0.12-0.015 MG/24HR vaginal ring Place 1 each vaginally every 28 (twenty-eight) days. Insert vaginally and leave in place for 3 consecutive weeks, then remove for 1 week.    . fluticasone (FLONASE) 50 MCG/ACT nasal spray Place 2 sprays into both nostrils daily. 16 g 0  . ibuprofen (ADVIL) 200 MG tablet Take 200 mg by mouth every 6 (six) hours as needed.    . loratadine-pseudoephedrine (ALLERGY RELIEF/NASAL DECONGEST) 10-240 MG 24 hr tablet Take 1 tablet by mouth daily. 90 tablet 3  . pantoprazole (PROTONIX) 40 MG tablet Take 1 tablet (40 mg total) by mouth daily. 30 tablet 3   No current facility-administered medications for this visit.    Allergies  Allergen Reactions  . Aspirin   . Sulfonamide Derivatives     Family History  Problem Relation Age of Onset  . Lupus Mother   . Hypertension Father     Social History   Socioeconomic History  . Marital status: Married    Spouse name: Not on file  . Number of children: 2  . Years of education: Not on file  . Highest education level: Not on file  Occupational History  . Occupation: Teacher, adult education: [employer]  Tobacco Use  . Smoking status: Never Smoker  . Smokeless tobacco: Never Used  Substance and Sexual Activity  . Alcohol  use: No  . Drug use: No  . Sexual activity: Not on file  Other Topics Concern  . Not on file  Social History Narrative  . Not on file   Social Determinants of Health   Financial Resource Strain:   . Difficulty of Paying Living Expenses: Not on file  Food Insecurity:   . Worried About Programme researcher, broadcasting/film/video in the Last Year: Not on file  . Ran Out of Food in the Last Year: Not on file  Transportation Needs:   . Lack of Transportation (Medical): Not on file  . Lack of Transportation (Non-Medical): Not on file  Physical Activity:   . Days of Exercise per Week: Not on file  . Minutes of Exercise per Session: Not on file  Stress:   . Feeling of Stress : Not on file  Social Connections:   . Frequency of Communication with Friends and Family: Not on file  . Frequency of Social Gatherings with Friends and Family: Not on file  . Attends Religious Services: Not on file  . Active Member of Clubs or Organizations: Not on file  . Attends Banker Meetings: Not on file  . Marital Status: Not on file  Intimate Partner Violence:   . Fear of Current or Ex-Partner: Not on file  . Emotionally Abused: Not on file  . Physically Abused: Not on file  . Sexually Abused: Not on file  ROS:  Constitutional: Denies fever, malaise, fatigue, headache or abrupt weight changes.  HEENT: Denies eye pain, eye redness, ear pain, ringing in the ears, wax buildup, runny nose, nasal congestion, bloody nose, or sore throat. Respiratory: Denies difficulty breathing, shortness of breath, cough or sputum production.   Cardiovascular: Denies chest pain, chest tightness, palpitations or swelling in the hands or feet.  Gastrointestinal: Denies abdominal pain, bloating, constipation, diarrhea or blood in the stool.  GU: Denies frequency, urgency, pain with urination, blood in urine, odor or discharge. Musculoskeletal: Denies decrease in range of motion, difficulty with gait, muscle pain or joint pain and  swelling.  Skin: Denies redness, rashes, lesions or ulcercations.  Neurological: Denies dizziness, difficulty with memory, difficulty with speech or problems with balance and coordination.  Psych: Denies anxiety, depression, SI/HI.  No other specific complaints in a complete review of systems (except as listed in HPI above).  PE:  BP 102/62 (BP Location: Left Arm, Patient Position: Sitting, Cuff Size: Normal)   Pulse 91   Temp 97.6 F (36.4 C) (Temporal)   Resp 16   Ht 5' 3.75" (1.619 m)   Wt 160 lb (72.6 kg)   LMP 07/07/2020   SpO2 99%   BMI 27.68 kg/m   Wt Readings from Last 3 Encounters:  04/03/20 155 lb (70.3 kg)  03/25/19 159 lb (72.1 kg)  02/20/19 149 lb (67.6 kg)    General: Appears her stated age, well developed, well nourished in NAD. HEENT: Head: normal shape and size; Eyes: sclera white, no icterus, conjunctiva pink, PERRLA and EOMs intact; Neck: Neck supple, trachea midline. No masses, lumps or thyromegaly present.  Cardiovascular: Normal rate and rhythm. S1,S2 noted.  No murmur, rubs or gallops noted. No JVD or BLE edema.  Pulmonary/Chest: Normal effort and positive vesicular breath sounds. No respiratory distress. No wheezes, rales or ronchi noted.  Abdomen: Soft and nontender. Normal bowel sounds, no bruits noted. No distention or masses noted. Liver, spleen and kidneys non palpable. Musculoskeletal:  Strength 5/5 BUE/BLE. No signs of joint swelling. No difficulty with gait.  Neurological: Alert and oriented. Cranial nerves II-XII grossly intact. Coordination normal.  Psychiatric: Mood and affect normal. Behavior is normal. Judgment and thought content normal.    BMET    Component Value Date/Time   NA 138 02/20/2018 1115   K 4.8 02/20/2018 1115   CL 105 02/20/2018 1115   CO2 26 02/20/2018 1115   GLUCOSE 87 02/20/2018 1115   BUN 11 02/20/2018 1115   CREATININE 0.80 02/20/2018 1115   CALCIUM 9.8 02/20/2018 1115   GFRNONAA 125.24 08/24/2010 1135     Lipid Panel     Component Value Date/Time   CHOL 193 02/20/2018 1115   TRIG 73.0 02/20/2018 1115   HDL 88.10 02/20/2018 1115   CHOLHDL 2 02/20/2018 1115   VLDL 14.6 02/20/2018 1115   LDLCALC 91 02/20/2018 1115    CBC    Component Value Date/Time   WBC 8.4 02/20/2018 1115   RBC 3.86 (L) 02/20/2018 1115   HGB 11.6 (L) 02/20/2018 1115   HCT 35.8 (L) 02/20/2018 1115   PLT 286.0 02/20/2018 1115   MCV 92.9 02/20/2018 1115   MCH 29.4 01/18/2011 1839   MCHC 32.5 02/20/2018 1115   RDW 12.4 02/20/2018 1115   LYMPHSABS 2.7 02/20/2018 1115   MONOABS 0.4 02/20/2018 1115   EOSABS 0.1 02/20/2018 1115   BASOSABS 0.0 02/20/2018 1115    Hgb A1C No results found for: HGBA1C   Assessment and Plan:  Preventative Health Maintenance:  Flu shot UTD Tetanus UTD Pap smear UTD, will request copy Mammogram ordered, she will call to schedule Will start colon cancer screening at 45 Encouraged her to consume a balanced diet and exercise regimen Advised her to see an eye doctor and dentist annually Will check CBC, CMET, Lipid, A1C and Vit D today  RTC in 1 year, sooner if needed Nicki Reaper, NP. This visit occurred during the SARS-CoV-2 public health emergency.  Safety protocols were in place, including screening questions prior to the visit, additional usage of staff PPE, and extensive cleaning of exam room while observing appropriate contact time as indicated for disinfecting solutions.

## 2020-08-11 ENCOUNTER — Other Ambulatory Visit: Payer: Self-pay | Admitting: Internal Medicine

## 2020-08-11 ENCOUNTER — Encounter: Payer: Self-pay | Admitting: General Practice

## 2020-08-11 MED ORDER — VITAMIN D (ERGOCALCIFEROL) 1.25 MG (50000 UNIT) PO CAPS: 50000.0000 [IU] | ORAL_CAPSULE | ORAL | 0 refills | Status: DC

## 2020-09-03 ENCOUNTER — Other Ambulatory Visit: Payer: Self-pay | Admitting: Internal Medicine

## 2020-09-03 DIAGNOSIS — N6320 Unspecified lump in the left breast, unspecified quadrant: Secondary | ICD-10-CM

## 2020-09-23 ENCOUNTER — Ambulatory Visit
Admission: RE | Admit: 2020-09-23 | Discharge: 2020-09-23 | Disposition: A | Discharge status: Home / Self Care | Payer: No Typology Code available for payment source | Source: Ambulatory Visit | Attending: Internal Medicine | Admitting: Internal Medicine

## 2020-09-23 ENCOUNTER — Other Ambulatory Visit: Payer: Self-pay

## 2020-09-23 DIAGNOSIS — N6320 Unspecified lump in the left breast, unspecified quadrant: Secondary | ICD-10-CM | POA: Diagnosis present

## 2020-10-14 ENCOUNTER — Encounter: Payer: Self-pay | Admitting: Internal Medicine

## 2020-10-15 ENCOUNTER — Other Ambulatory Visit: Payer: Self-pay | Admitting: Nurse Practitioner

## 2020-10-23 ENCOUNTER — Telehealth: Payer: No Typology Code available for payment source | Admitting: Emergency Medicine

## 2020-10-23 DIAGNOSIS — R21 Rash and other nonspecific skin eruption: Secondary | ICD-10-CM

## 2020-10-23 NOTE — Progress Notes (Signed)
I'm sorry, based on the picture and the information you've shared with me, I cannot adequately diagnose or treat your condition.  I recommend that you be seen in person.  Based on what you shared with me, I feel your condition warrants further evaluation and I recommend that you be seen for a face to face visit.  Please contact your primary care physician practice to be seen. Many offices offer virtual options to be seen via video if you are not comfortable going in person to a medical facility at this time.  If you do not have a PCP, [facility] [facility] offers a free physician referral service available at [phone]. Our trained staff has the experience, knowledge and resources to put you in touch with a physician who is right for you.     If you are having a true medical emergency please call 911.  NOTE: If you entered your credit card information for this eVisit, you will not be charged. You may see a "hold" on your card for the $35 but that hold will drop off and you will not have a charge processed.  Your e-visit answers were reviewed by a board certified advanced clinical practitioner to complete your personal care plan.  Thank you for using e-Visits.  Approximately 5 minutes was used in reviewing the patient's chart, questionnaire, prescribing medications, and documentation.

## 2020-10-28 ENCOUNTER — Other Ambulatory Visit: Payer: Self-pay | Admitting: Internal Medicine

## 2020-10-28 ENCOUNTER — Encounter: Payer: Self-pay | Admitting: Internal Medicine

## 2020-10-28 ENCOUNTER — Other Ambulatory Visit: Payer: Self-pay

## 2020-10-28 ENCOUNTER — Ambulatory Visit (INDEPENDENT_AMBULATORY_CARE_PROVIDER_SITE_OTHER): Payer: No Typology Code available for payment source | Admitting: Internal Medicine

## 2020-10-28 VITALS — BP 122/74 | HR 76 | Temp 98.1°F | Wt 158.0 lb

## 2020-10-28 DIAGNOSIS — R21 Rash and other nonspecific skin eruption: Secondary | ICD-10-CM

## 2020-10-28 MED ORDER — FLUCONAZOLE 100 MG PO TABS: 100.0000 mg | ORAL_TABLET | Freq: Every day | ORAL | 0 refills | Status: DC

## 2020-10-28 NOTE — Patient Instructions (Signed)
Rash, Adult  A rash is a change in the color of your skin. A rash can also change the way your skin feels. There are many different conditions and factors that can cause a rash. Follow these instructions at home: The goal of treatment is to stop the itching and keep the rash from spreading. Watch for any changes in your symptoms. Let your doctor know about them. Follow these instructions to help with your condition: Medicine Take or apply over-the-counter and prescription medicines only as told by your doctor. These may include medicines:  To treat red or swollen skin (corticosteroid creams).  To treat itching.  To treat an allergy (oral antihistamines).  To treat very bad symptoms (oral corticosteroids).   Skin care  Put cool cloths (compresses) on the affected areas.  Do not scratch or rub your skin.  Avoid covering the rash. Make sure that the rash is exposed to air as much as possible. Managing itching and discomfort  Avoid hot showers or baths. These can make itching worse. A cold shower may help.  Try taking a bath with: ? Epsom salts. You can get these at your local pharmacy or grocery store. Follow the instructions on the package. ? Baking soda. Pour a small amount into the bath as told by your doctor. ? Colloidal oatmeal. You can get this at your local pharmacy or grocery store. Follow the instructions on the package.  Try putting baking soda paste onto your skin. Stir water into baking soda until it gets like a paste.  Try putting on a lotion that relieves itchiness (calamine lotion).  Keep cool and out of the sun. Sweating and being hot can make itching worse. General instructions  Rest as needed.  Drink enough fluid to keep your pee (urine) pale yellow.  Wear loose-fitting clothing.  Avoid scented soaps, detergents, and perfumes. Use gentle soaps, detergents, perfumes, and other cosmetic products.  Avoid anything that causes your rash. Keep a journal to help  track what causes your rash. Write down: ? What you eat. ? What cosmetic products you use. ? What you drink. ? What you wear. This includes jewelry.  Keep all follow-up visits as told by your doctor. This is important.   Contact a doctor if:  You sweat at night.  You lose weight.  You pee (urinate) more than normal.  You pee less than normal, or you notice that your pee is a darker color than normal.  You feel weak.  You throw up (vomit).  Your skin or the whites of your eyes look yellow (jaundice).  Your skin: ? Tingles. ? Is numb.  Your rash: ? Does not go away after a few days. ? Gets worse.  You are: ? More thirsty than normal. ? More tired than normal.  You have: ? New symptoms. ? Pain in your belly (abdomen). ? A fever. ? Watery poop (diarrhea). Get help right away if:  You have a fever and your symptoms suddenly get worse.  You start to feel mixed up (confused).  You have a very bad headache or a stiff neck.  You have very bad joint pains or stiffness.  You have jerky movements that you cannot control (seizure).  Your rash covers all or most of your body. The rash may or may not be painful.  You have blisters that: ? Are on top of the rash. ? Grow larger. ? Grow together. ? Are painful. ? Are inside your nose or mouth.  You have   a rash that: ? Looks like purple pinprick-sized spots all over your body. ? Has a "bull's eye" or looks like a target. ? Is red and painful, causes your skin to peel, and is not from being in the sun too long. Summary  A rash is a change in the color of your skin. A rash can also change the way your skin feels.  The goal of treatment is to stop the itching and keep the rash from spreading.  Take or apply over-the-counter and prescription medicines only as told by your doctor.  Contact a doctor if you have new symptoms or symptoms that get worse.  Keep all follow-up visits as told by your doctor. This is  important. This information is not intended to replace advice given to you by your health care provider. Make sure you discuss any questions you have with your health care provider. Document Revised: 01/26/2019 Document Reviewed: 05/08/2018 Elsevier Patient Education  2021 [employer] Inc.  

## 2020-10-28 NOTE — Progress Notes (Signed)
Subjective:    Patient ID: Jodi Ross, female    DOB: [birth year], 45 y.o.   MRN: [id number]  HPI  Pt presents to the clinic today with c/o a rash. She noticed this 2-3 weeks ago. It is starting to itch. It started out on her chest and has now spread to her stomach and arms. She has been apply topical antifungal cream, oral terbinafine, Eucerin and hydrocortisone OTC with some improvement but it does continue to spread.  No one in her home has a similar rash.  She denies new animals or pets in the home.  Review of Systems      Past Medical History:  Diagnosis Date  . Allergy   . Anemia, iron deficiency   . G6PD (glucose 6 phosphatase deficiency)   . G6PD deficiency     Current Outpatient Medications  Medication Sig Dispense Refill  . amLODipine (NORVASC) 10 MG tablet Take 1 tablet (10 mg total) by mouth daily. 90 tablet 3  . etonogestrel-ethinyl estradiol (NUVARING) 0.12-0.015 MG/24HR vaginal ring Place 1 each vaginally every 28 (twenty-eight) days. Insert vaginally and leave in place for 3 consecutive weeks, then remove for 1 week. 3 each 3  . fluticasone (FLONASE) 50 MCG/ACT nasal spray Place 2 sprays into both nostrils daily. 16 g 0  . ibuprofen (ADVIL) 200 MG tablet Take 200 mg by mouth every 6 (six) hours as needed.    . loratadine-pseudoephedrine (ALLERGY RELIEF/NASAL DECONGEST) 10-240 MG 24 hr tablet Take 1 tablet by mouth daily. 90 tablet 3  . pantoprazole (PROTONIX) 40 MG tablet Take 1 tablet (40 mg total) by mouth daily. 30 tablet 3  . Vitamin D, Ergocalciferol, (DRISDOL) 1.25 MG (50000 UNIT) CAPS capsule Take 1 capsule (50,000 Units total) by mouth every 7 (seven) days. 12 capsule 0   No current facility-administered medications for this visit.    Allergies  Allergen Reactions  . Aspirin   . Sulfonamide Derivatives     Family History  Problem Relation Age of Onset  . Lupus Mother   . Hypertension Father     Social History   Socioeconomic History  .  Marital status: Married    Spouse name: Not on file  . Number of children: 2  . Years of education: Not on file  . Highest education level: Not on file  Occupational History  . Occupation: Teacher, adult education: [employer]  Tobacco Use  . Smoking status: Never Smoker  . Smokeless tobacco: Never Used  Substance and Sexual Activity  . Alcohol use: No  . Drug use: No  . Sexual activity: Not on file  Other Topics Concern  . Not on file  Social History Narrative  . Not on file   Social Determinants of Health   Financial Resource Strain: Not on file  Food Insecurity: Not on file  Transportation Needs: Not on file  Physical Activity: Not on file  Stress: Not on file  Social Connections: Not on file  Intimate Partner Violence: Not on file     Constitutional: Denies fever, malaise, fatigue, headache or abrupt weight changes.  Respiratory: Denies difficulty breathing, shortness of breath, cough or sputum production.   Cardiovascular: Denies chest pain, chest tightness, palpitations or swelling in the hands or feet.  Skin: Pt reports rash. Denies ulcercations.   No other specific complaints in a complete review of systems (except as listed in HPI above).  Objective:   Physical Exam   BP 122/74   Pulse  76   Temp 98.1 F (36.7 C) (Temporal)   Wt 158 lb (71.7 kg)   SpO2 98%   BMI 27.33 kg/m   Wt Readings from Last 3 Encounters:  08/07/20 160 lb (72.6 kg)  04/03/20 155 lb (70.3 kg)  03/25/19 159 lb (72.1 kg)    General: Appears her stated age, well developed, well nourished in NAD. Skin: Multiple round scaly lesions with central clearing noted of chest, abdomen and bilateral arms. Cardiovascular: Normal rate. Pulmonary/Chest: Normal effort. Neurological: Alert and oriented.   BMET    Component Value Date/Time   NA 137 08/07/2020 1230   K 3.5 08/07/2020 1230   CL 104 08/07/2020 1230   CO2 27 08/07/2020 1230   GLUCOSE 84 08/07/2020 1230   BUN 10  08/07/2020 1230   CREATININE 0.83 08/07/2020 1230   CALCIUM 10.1 08/07/2020 1230   GFRNONAA 125.24 08/24/2010 1135    Lipid Panel     Component Value Date/Time   CHOL 213 (H) 08/07/2020 1230   TRIG 58.0 08/07/2020 1230   HDL 107.40 08/07/2020 1230   CHOLHDL 2 08/07/2020 1230   VLDL 11.6 08/07/2020 1230   LDLCALC 94 08/07/2020 1230    CBC    Component Value Date/Time   WBC 7.5 08/07/2020 1230   RBC 3.76 (L) 08/07/2020 1230   HGB 11.3 (L) 08/07/2020 1230   HCT 34.8 (L) 08/07/2020 1230   PLT 299.0 08/07/2020 1230   MCV 92.7 08/07/2020 1230   MCH 29.4 01/18/2011 1839   MCHC 32.6 08/07/2020 1230   RDW 12.7 08/07/2020 1230   LYMPHSABS 2.7 02/20/2018 1115   MONOABS 0.4 02/20/2018 1115   EOSABS 0.1 02/20/2018 1115   BASOSABS 0.0 02/20/2018 1115    Hgb A1C Lab Results  Component Value Date   HGBA1C 4.5 (L) 08/07/2020           Assessment & Plan:   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  Safety protocols were in place, including screening questions prior to the visit, additional usage of staff PPE, and extensive cleaning of exam room while observing appropriate contact time as indicated for disinfecting solutions.

## 2020-11-09 ENCOUNTER — Encounter: Payer: Self-pay | Admitting: Internal Medicine

## 2020-11-13 ENCOUNTER — Other Ambulatory Visit: Payer: Self-pay

## 2020-11-13 ENCOUNTER — Ambulatory Visit (INDEPENDENT_AMBULATORY_CARE_PROVIDER_SITE_OTHER): Payer: No Typology Code available for payment source | Admitting: Internal Medicine

## 2020-11-13 ENCOUNTER — Encounter: Payer: Self-pay | Admitting: Internal Medicine

## 2020-11-13 VITALS — BP 122/78 | HR 88 | Temp 98.1°F | Wt 156.0 lb

## 2020-11-13 DIAGNOSIS — R768 Other specified abnormal immunological findings in serum: Secondary | ICD-10-CM | POA: Diagnosis not present

## 2020-11-13 DIAGNOSIS — R21 Rash and other nonspecific skin eruption: Secondary | ICD-10-CM | POA: Diagnosis not present

## 2020-11-13 DIAGNOSIS — Z840 Family history of diseases of the skin and subcutaneous tissue: Secondary | ICD-10-CM

## 2020-11-13 LAB — HIGH SENSITIVITY CRP: CRP, High Sensitivity: 2.66 mg/L (ref 0.000–5.000)

## 2020-11-13 LAB — SEDIMENTATION RATE: Sed Rate: 17 mm/hr (ref 0–20)

## 2020-11-13 NOTE — Patient Instructions (Signed)
Systemic Lupus Erythematosus, Adult Systemic lupus erythematosus (SLE) is a long-term (chronic) disease that can affect many parts of the body. SLE is an autoimmune disease. With this type of disease, the body's defense system (immune system) mistakenly attacks healthy tissues. This can cause damage to the skin, joints, blood vessels, brain, kidneys, lungs, heart, and other internal organs. It causes pain, irritation, and inflammation. What are the causes? The cause of this condition is not known. What increases the risk? The following factors may make you more likely to develop this condition:  Being female.  Being of Asian, Hispanic, or African-American descent.  Having a family history of the condition.  Being exposed to tobacco smoke or smoking cigarettes.  Having an infection with a virus, such as Epstein-Barr virus.  Having a history of exposure to silica dust, metals, chemicals, mold or mildew, or insecticides.  Using oral contraceptives or hormone replacement therapy. What are the signs or symptoms? This condition can affect almost any organ or system in the body. Symptoms of the condition depend on which organ or system is affected. The most common symptoms include:  Fever.  Fatigue.  Weight loss.  Muscle aches.  Joint pain.  Skin rashes, especially over the nose and cheeks (butterfly rash) and after sun exposure. Symptoms can come and go. A period of time when symptoms get worse or come back is called a flare. A period of time with no symptoms is called a remission. How is this diagnosed? This condition is diagnosed based on:  Your symptoms.  Your medical history.  A physical exam. You may also have tests, including:  Blood tests.  Urine tests.  A chest X-ray. You may be referred to an autoimmune disease specialist (rheumatologist). How is this treated? There is no cure for this condition, but treatment can help to control symptoms, prevent flares (keep  symptoms in remission), and prevent damage to the heart, lungs, kidneys, and other organs. Treatment will depend on what symptoms you are having and what organs or systems are affected. Treatment may involve taking a combination of medicines over time. Common medicines used to treat this condition include:  Antimalarial medicines to control symptoms, prevent flares, and protect against organ damage.  Corticosteroids and NSAIDs to reduce inflammation.  Medicines to weaken your immune system (immunosuppressants).  Biologic response modifiers to reduce inflammation and damage. Follow these instructions at home: Eating and drinking  Eat a heart-healthy diet. This may include: ? Eating high-fiber foods, such as fresh fruits and vegetables, whole grains, and beans. ? Eating heart-healthy fats (omega-3 fats), such as fish, flaxseed, and flaxseed oil. ? Limiting foods that are high in saturated fat and cholesterol, such as processed and fried foods, fatty meat, and full-fat dairy. ? Limiting how much salt (sodium) you eat.  Include calcium and vitamin D in your diet. Good sources of calcium and vitamin D include: ? Low-fat dairy products such as milk, yogurt, and cheese. ? Certain fish, such as fresh or canned salmon, tuna, and sardines. ? Products that have calcium and vitamin D added to them (fortified products), such as fortified cereals or juice. Medicines  Take over-the-counter and prescription medicines only as told by your health care provider.  Do not take any medicines that contain estrogen without first checking with your health care provider. Estrogen can trigger flares and may increase your risk for blood clots. Lifestyle  Stay active, as directed by your health care provider.  Do not use any products that contain nicotine or   tobacco, such as cigarettes and e-cigarettes. If you need help quitting, ask your health care provider.  Protect your skin from the sun by applying  sunblock and wearing protective hats and clothing.  Learn as much as you can about your condition and have a good support system in place. Support may come from family, friends, or a lupus support group.      General instructions  Work closely with all of your health care providers to manage your condition.  Stay up to date on all vaccines as directed by your health care provider.  Keep all follow-up visits as told by your health care provider. This is important. Contact a health care provider if:  You have a fever.  Your symptoms flare.  You develop new symptoms.  You have bloody, foamy, or coffee-colored urine.  There are changes in your urination. For example, you urinate more often at night.  You think that you may be depressed or have anxiety.  You become pregnant or plan to become pregnant. Pregnancy in women with this condition is considered high risk. Get help right away if:  You have chest pain.  You have trouble breathing.  You have a seizure.  You suddenly get a very bad headache.  You suddenly develop facial or body weakness.  You cannot speak.  You cannot understand speech. These symptoms may represent a serious problem that is an emergency. Do not wait to see if the symptoms will go away. Get medical help right away. Call your local emergency services (911 in the [place] Do not drive yourself to the hospital. Summary  Systemic lupus erythematosus (SLE) is a long-term disease that can affect many parts of the body.  SLE is an autoimmune disease. That means your body's defense system (immune system) mistakenly attacks healthy tissues.  There is no cure for this condition, but treatment can help to control symptoms, prevent flares, and prevent damage to your organs. Treatment may involve taking a combination of medicines over time. This information is not intended to replace advice given to you by your health care provider. Make sure you discuss any  questions you have with your health care provider. Document Revised: 11/17/2017 Document Reviewed: 11/11/2017 Elsevier Patient Education  2021 Elsevier Inc.  

## 2020-11-13 NOTE — Progress Notes (Signed)
Subjective:    Patient ID: Jodi Ross, female    DOB: Mar 08, 1976, 45 y.o.   MRN: [id number]  HPI  Patient presents the clinic today with complaint of a rash.  She noticed this about 4 to 5 weeks ago.  It started on her chest but has now spread to her abdomen, back, arms, perineal area, bilateral legs and neck.  The rash itches.  She tried oral antifungals, antifungal cream, Eucerin and hydrocortisone with minimal improvement in her symptoms.  She would like to be evaluated for lupus as she has a family history of this.  Review of Systems      Past Medical History:  Diagnosis Date  . Allergy   . Anemia, iron deficiency   . G6PD (glucose 6 phosphatase deficiency)   . G6PD deficiency     Current Outpatient Medications  Medication Sig Dispense Refill  . amLODipine (NORVASC) 10 MG tablet Take 1 tablet (10 mg total) by mouth daily. 90 tablet 3  . etonogestrel-ethinyl estradiol (NUVARING) 0.12-0.015 MG/24HR vaginal ring Place 1 each vaginally every 28 (twenty-eight) days. Insert vaginally and leave in place for 3 consecutive weeks, then remove for 1 week. 3 each 3  . fluconazole (DIFLUCAN) 100 MG tablet Take 1 tablet (100 mg total) by mouth daily. 14 tablet 0  . fluticasone (FLONASE) 50 MCG/ACT nasal spray Place 2 sprays into both nostrils daily. 16 g 0  . ibuprofen (ADVIL) 200 MG tablet Take 200 mg by mouth every 6 (six) hours as needed.    . loratadine-pseudoephedrine (ALLERGY RELIEF/NASAL DECONGEST) 10-240 MG 24 hr tablet Take 1 tablet by mouth daily. 90 tablet 3  . pantoprazole (PROTONIX) 40 MG tablet Take 1 tablet (40 mg total) by mouth daily. 30 tablet 3  . Vitamin D, Ergocalciferol, (DRISDOL) 1.25 MG (50000 UNIT) CAPS capsule Take 1 capsule (50,000 Units total) by mouth every 7 (seven) days. 12 capsule 0   No current facility-administered medications for this visit.    Allergies  Allergen Reactions  . Aspirin   . Sulfonamide Derivatives     Family History  Problem  Relation Age of Onset  . Lupus Mother   . Hypertension Father     Social History   Socioeconomic History  . Marital status: Married    Spouse name: Not on file  . Number of children: 2  . Years of education: Not on file  . Highest education level: Not on file  Occupational History  . Occupation: Teacher, adult education: [employer]  Tobacco Use  . Smoking status: Never Smoker  . Smokeless tobacco: Never Used  Substance and Sexual Activity  . Alcohol use: No  . Drug use: No  . Sexual activity: Not on file  Other Topics Concern  . Not on file  Social History Narrative  . Not on file   Social Determinants of Health   Financial Resource Strain: Not on file  Food Insecurity: Not on file  Transportation Needs: Not on file  Physical Activity: Not on file  Stress: Not on file  Social Connections: Not on file  Intimate Partner Violence: Not on file     Constitutional: Denies fever, malaise, fatigue, headache or abrupt weight changes.  Respiratory: Denies difficulty breathing, shortness of breath, cough or sputum production.   Cardiovascular: Denies chest pain, chest tightness, palpitations or swelling in the hands or feet.  Musculoskeletal: Denies decrease in range of motion, difficulty with gait, muscle pain or joint pain and swelling.  Skin: Pt reports rash. Denies redness or ulcercations.    No other specific complaints in a complete review of systems (except as listed in HPI above).  Objective:   Physical Exam BP 122/78   Pulse 88   Temp 98.1 F (36.7 C) (Temporal)   Wt 156 lb (70.8 kg)   SpO2 98%   BMI 26.99 kg/m   Wt Readings from Last 3 Encounters:  10/28/20 158 lb (71.7 kg)  08/07/20 160 lb (72.6 kg)  04/03/20 155 lb (70.3 kg)    General: Appears her stated age, well developed, well nourished in NAD. Skin: Scattered scaly lesions essentially noted everywhere except for face, palms and soles of feet. Cardiovascular: Normal rate. Pulmonary/Chest:  Normal effort. Neurological: Alert and oriented.   BMET    Component Value Date/Time   NA 137 08/07/2020 1230   K 3.5 08/07/2020 1230   CL 104 08/07/2020 1230   CO2 27 08/07/2020 1230   GLUCOSE 84 08/07/2020 1230   BUN 10 08/07/2020 1230   CREATININE 0.83 08/07/2020 1230   CALCIUM 10.1 08/07/2020 1230   GFRNONAA 125.24 08/24/2010 1135    Lipid Panel     Component Value Date/Time   CHOL 213 (H) 08/07/2020 1230   TRIG 58.0 08/07/2020 1230   HDL 107.40 08/07/2020 1230   CHOLHDL 2 08/07/2020 1230   VLDL 11.6 08/07/2020 1230   LDLCALC 94 08/07/2020 1230    CBC    Component Value Date/Time   WBC 7.5 08/07/2020 1230   RBC 3.76 (L) 08/07/2020 1230   HGB 11.3 (L) 08/07/2020 1230   HCT 34.8 (L) 08/07/2020 1230   PLT 299.0 08/07/2020 1230   MCV 92.7 08/07/2020 1230   MCH 29.4 01/18/2011 1839   MCHC 32.6 08/07/2020 1230   RDW 12.7 08/07/2020 1230   LYMPHSABS 2.7 02/20/2018 1115   MONOABS 0.4 02/20/2018 1115   EOSABS 0.1 02/20/2018 1115   BASOSABS 0.0 02/20/2018 1115    Hgb A1C Lab Results  Component Value Date   HGBA1C 4.5 (L) 08/07/2020             Assessment & Plan:  Rash, Family History of Lupus:  No improvement with oral antifungals, topical antifungals or topical steroid creams Will obtain ANA, RF, ESR, CRP, C3, C4, antidouble-stranded DNA, antiphospholipid antibodies and serum protein electrophoresis She has an upcoming appointment with dermatology 2/2-encouraged her to keep this Consider referral to rheumatology pending labs  We will follow-up after labs, return precautions discussed  Nicki Reaper, NP This visit occurred during the SARS-CoV-2 public health emergency.  Safety protocols were in place, including screening questions prior to the visit, additional usage of staff PPE, and extensive cleaning of exam room while observing appropriate contact time as indicated for disinfecting solutions.

## 2020-11-17 LAB — PROTEIN ELECTROPHORESIS, SERUM
Albumin ELP: 4 g/dL (ref 3.8–4.8)
Alpha 1: 0.4 g/dL — ABNORMAL HIGH (ref 0.2–0.3)
Alpha 2: 0.6 g/dL (ref 0.5–0.9)
Beta 2: 0.3 g/dL (ref 0.2–0.5)
Beta Globulin: 0.4 g/dL (ref 0.4–0.6)
Gamma Globulin: 1.6 g/dL (ref 0.8–1.7)
Total Protein: 7.3 g/dL (ref 6.1–8.1)

## 2020-11-17 LAB — ANTI-NUCLEAR AB-TITER (ANA TITER)
ANA TITER: 1:640 {titer} — ABNORMAL HIGH
ANA Titer 1: 1:320 {titer} — ABNORMAL HIGH

## 2020-11-17 LAB — ANTIPHOSPHOLIPID SYNDROME DIAGNOSTIC PANEL
Anticardiolipin IgA: <2 APL-U/mL (ref <20.0)
Anticardiolipin IgG: <2 GPL-U/mL (ref <20.0)
Anticardiolipin IgM: <2 MPL-U/mL (ref <20.0)
Beta-2 Glyco 1 IgA: <2 U/mL (ref <20.0)
Beta-2 Glyco 1 IgM: <2 U/mL (ref <20.0)
Beta-2 Glyco I IgG: <2 U/mL (ref <20.0)
PTT-LA Screen: 34 s (ref <=40)
dRVVT: 31 s (ref <=45)

## 2020-11-17 LAB — C3 AND C4
C3 Complement: 126 mg/dL (ref 83–193)
C4 Complement: 19 mg/dL (ref 15–57)

## 2020-11-17 LAB — RHEUMATOID FACTOR: Rheumatoid fact SerPl-aCnc: <14 IU/mL (ref <14)

## 2020-11-17 LAB — ANA: Anti Nuclear Antibody (ANA): POSITIVE — AB

## 2020-11-17 LAB — ANTI-DNA ANTIBODY, DOUBLE-STRANDED: ds DNA Ab: 1 IU/mL

## 2020-11-19 ENCOUNTER — Other Ambulatory Visit: Payer: Self-pay

## 2020-11-19 ENCOUNTER — Other Ambulatory Visit: Payer: Self-pay | Admitting: Dermatology

## 2020-11-19 ENCOUNTER — Ambulatory Visit (INDEPENDENT_AMBULATORY_CARE_PROVIDER_SITE_OTHER): Payer: No Typology Code available for payment source | Admitting: Dermatology

## 2020-11-19 DIAGNOSIS — L219 Seborrheic dermatitis, unspecified: Secondary | ICD-10-CM

## 2020-11-19 DIAGNOSIS — L42 Pityriasis rosea: Secondary | ICD-10-CM | POA: Diagnosis not present

## 2020-11-19 MED ORDER — FLUOCINOLONE ACETONIDE BODY 0.01 % EX OIL: 1.0000 "application " | TOPICAL_OIL | CUTANEOUS | 3 refills | Status: DC

## 2020-11-19 MED ORDER — CLOBETASOL PROPIONATE 0.05 % EX SOLN: 1.0000 "application " | CUTANEOUS | 1 refills | Status: DC

## 2020-11-19 NOTE — Addendum Note (Signed)
Addended by: Lorre Munroe on: 11/19/2020 01:00 PM   Modules accepted: Orders

## 2020-11-19 NOTE — Progress Notes (Signed)
   New Patient Visit  Subjective  Jodi Ross is a 45 y.o. female who presents for the following: Rash (From neck down, started out with one patch that looked like ringworm, ~22m, itchy, Diflucan 100mg  x 2 wks, betamethasone cream nothing has helped, recent lab work for Autoimmune disease, no hx of being sick prior to rash, no hx of eczema, allergies, has G6 PD deficiency, no new medications, no hx of sexually transmitted disease). No new medications other than what was used to treat the rash.  New patient referral from NP.  The following portions of the chart were reviewed this encounter and updated as appropriate:       Review of Systems:  No other skin or systemic complaints except as noted in HPI or Assessment and Plan.  Objective  Well appearing patient in no apparent distress; mood and affect are within normal limits.  A focused examination was performed including trunk, extremities. Relevant physical exam findings are noted in the Assessment and Plan.  Objective  trunk, extremities: Faint follicular prominence with mild erythema and hyperpigmentation chest, violaceous hyperpigmented patches neck, lower back, faint annular hyperpigmented/hypopigmented macules and patches with fine scale on trunk, arms, legs.  Scaly hyperpigmented patches axilla  Objective  Scalp: Greasy scaling scalp and frontal hairline.  Pt has extensions/braids   Assessment & Plan  Pityriasis rosea trunk, extremities  Currently flared. Discussed unknown etiology, possible viral.  Self-limited, but can take 2-3 months to resolve.  No systemic involvement. Information given on condition. Labs viewed from 11/13/20, with positive ANA, PCP is referring pt to rheumatology.  Doubt it is related to rash.  Dr. 11/15/20 also evaluated pt and agrees with diagnosis  Start Clobetasol sol/Cerave mix qd/bid to rash until clear, avoid face, groin, axilla Start Derma-Smooth FS oil qd to aa face, axilla until  itchy rash clear, then prn flares   Topical steroids (such as triamcinolone, fluocinolone, fluocinonide, mometasone, clobetasol, halobetasol, betamethasone, hydrocortisone) can cause thinning and lightening of the skin if they are used for too long in the same area. Your physician has selected the right strength medicine for your problem and area affected on the body. Please use your medication only as directed by your physician to prevent side effects.    clobetasol (TEMOVATE) 0.05 % external solution - trunk, extremities  Seborrheic dermatitis Scalp   Start Derma-Smooth FS oil qd/bid aa itchy scalp prn flares Recommend H&S shampoo, may need to wash hair more frequently- every 1-2 weeks instead of monthly  Seborrheic Dermatitis  -  is a chronic persistent rash characterized by pinkness and scaling most commonly of the mid face but also can occur on the scalp (dandruff), ears; mid chest and mid back. It tends to be exacerbated by stress and cooler weather.  People who have neurologic disease may experience new onset or exacerbation of existing seborrheic dermatitis.  The condition is not curable but treatable and can be controlled.   Fluocinolone Acetonide Body 0.01 % OIL - Scalp  Return if symptoms worsen or fail to improve.   I, Neale Burly, RMA, am acting as scribe for Ardis Rowan, MD . Documentation: I have reviewed the above documentation for accuracy and completeness, and I agree with the above.  Willeen Niece MD

## 2020-11-19 NOTE — Patient Instructions (Signed)
Eczema Skin Care  Buy TWO 16oz jars of CeraVe moisturizing cream  [facility], [facility], [facility] (no prescription needed)  Costs about $15 per jar   Jar #1: Use as a moisturizer as needed. Can be applied to any area of the body. Use twice daily to unaffected areas.  Jar #2: Pour one 50ml bottle of clobetasol 0.05% solution into jar, mix well. Label this jar to indicate the medication has been added. Use twice daily to affected areas. Do not apply to face, groin or underarms.  Moisturizer may burn or sting initially. Try for at least 4 weeks.  

## 2020-12-03 NOTE — Progress Notes (Deleted)
Office Visit Note  Patient: Jodi Ross             Date of Birth: 1976-03-15           MRN: [id number]             PCP: Lorre Munroe, NP Referring: Lorre Munroe, NP Visit Date: 12/04/2020 Occupation: @GUAROCC @  Subjective:  No chief complaint on file.   History of Present Illness: Jodi Ross is a 45 y.o. female here for evaluation of positive ANA and skin rashes.***    Labs reviewed 10/2020 ANA 1:320 cytoplasmic, 1:640 Dense fine speckled APS Abs neg SPEP neg dsDNA neg Complement C3 C4 wnl hsCRP 2.66 RF neg ESR 17  07/2020 Vitamin D 21.50   Activities of Daily Living:  Patient reports morning stiffness for 0 minutes.   Patient Denies nocturnal pain.  Difficulty dressing/grooming: Denies Difficulty climbing stairs: Denies Difficulty getting out of chair: Denies Difficulty using hands for taps, buttons, cutlery, and/or writing: Denies  Review of Systems  Constitutional: Positive for fatigue.  HENT: Negative for mouth sores, mouth dryness and nose dryness.   Eyes: Negative for pain, itching, visual disturbance and dryness.  Respiratory: Negative for cough, hemoptysis, shortness of breath and difficulty breathing.   Cardiovascular: Negative for chest pain, palpitations and swelling in legs/feet.  Gastrointestinal: Negative for abdominal pain, blood in stool, constipation and diarrhea.  Endocrine: Negative for increased urination.  Genitourinary: Negative for painful urination.  Musculoskeletal: Positive for muscle weakness. Negative for arthralgias, joint pain, joint swelling, myalgias, morning stiffness, muscle tenderness and myalgias.  Skin: Positive for rash. Negative for color change and redness.  Allergic/Immunologic: Negative for susceptible to infections.  Neurological: Positive for headaches and memory loss. Negative for dizziness, numbness and weakness.  Hematological: Negative for swollen glands.  Psychiatric/Behavioral: Positive for sleep  disturbance. Negative for confusion.    PMFS History:  Patient Active Problem List   Diagnosis Date Noted  . GERD (gastroesophageal reflux disease) 04/03/2020  . Essential hypertension 08/24/2010  . Allergic rhinitis 08/24/2010    Past Medical History:  Diagnosis Date  . Allergy   . Anemia, iron deficiency   . G6PD (glucose 6 phosphatase deficiency)   . G6PD deficiency     Family History  Problem Relation Age of Onset  . Lupus Mother   . Hypertension Father    No past surgical history on file. Social History   Social History Narrative  . Not on file   Immunization History  Administered Date(s) Administered  . Influenza,inj,Quad PF,6+ Mos 08/03/2018, 07/24/2020  . PFIZER(Purple Top)SARS-COV-2 Vaccination 02/29/2020, 03/22/2020, 10/21/2020  . Tdap 02/20/2018, 08/03/2018     Objective: Vital Signs: There were no vitals taken for this visit.   Physical Exam   Musculoskeletal Exam: ***  CDAI Exam: CDAI Score: - Patient Global: -; Provider Global: - Swollen: -; Tender: - Joint Exam 12/04/2020   No joint exam has been documented for this visit   There is currently no information documented on the homunculus. Go to the Rheumatology activity and complete the homunculus joint exam.  Investigation: No additional findings.  Imaging: No results found.  Recent Labs: Lab Results  Component Value Date   WBC 7.5 08/07/2020   HGB 11.3 (L) 08/07/2020   PLT 299.0 08/07/2020   NA 137 08/07/2020   K 3.5 08/07/2020   CL 104 08/07/2020   CO2 27 08/07/2020   GLUCOSE 84 08/07/2020   BUN 10 08/07/2020   CREATININE 0.83 08/07/2020  BILITOT 0.6 08/07/2020   ALKPHOS 29 (L) 08/07/2020   AST 21 08/07/2020   ALT 21 08/07/2020   PROT 7.3 11/13/2020   ALBUMIN 3.8 08/07/2020   CALCIUM 10.1 08/07/2020    Speciality Comments: No specialty comments available.  Procedures:  No procedures performed Allergies: Aspirin and Sulfonamide derivatives   Assessment / Plan:      Visit Diagnoses: No diagnosis found.  Orders: No orders of the defined types were placed in this encounter.  No orders of the defined types were placed in this encounter.   Face-to-face time spent with patient was *** minutes. Greater than 50% of time was spent in counseling and coordination of care.  Follow-Up Instructions: No follow-ups on file.   Fuller Plan, MD  Note - This record has been created using [employer].  Chart creation errors have been sought, but may not always  have been located. Such creation errors do not reflect on  the standard of medical care.

## 2020-12-04 ENCOUNTER — Encounter: Payer: Self-pay | Admitting: Internal Medicine

## 2020-12-04 ENCOUNTER — Other Ambulatory Visit: Payer: Self-pay

## 2020-12-04 ENCOUNTER — Ambulatory Visit (INDEPENDENT_AMBULATORY_CARE_PROVIDER_SITE_OTHER): Payer: No Typology Code available for payment source | Admitting: Internal Medicine

## 2020-12-04 DIAGNOSIS — Z5321 Procedure and treatment not carried out due to patient leaving prior to being seen by health care provider: Secondary | ICD-10-CM

## 2020-12-22 DIAGNOSIS — Z5321 Procedure and treatment not carried out due to patient leaving prior to being seen by health care provider: Secondary | ICD-10-CM | POA: Insufficient documentation

## 2020-12-29 ENCOUNTER — Ambulatory Visit: Payer: No Typology Code available for payment source | Admitting: Internal Medicine

## 2021-01-02 ENCOUNTER — Encounter: Payer: Self-pay | Admitting: Internal Medicine

## 2021-01-02 ENCOUNTER — Ambulatory Visit: Payer: No Typology Code available for payment source | Admitting: Internal Medicine

## 2021-01-02 ENCOUNTER — Other Ambulatory Visit: Payer: Self-pay

## 2021-01-02 VITALS — BP 127/84 | HR 81 | Resp 14 | Ht 64.0 in | Wt 154.2 lb

## 2021-01-02 DIAGNOSIS — R61 Generalized hyperhidrosis: Secondary | ICD-10-CM | POA: Diagnosis not present

## 2021-01-02 DIAGNOSIS — L210 Seborrhea capitis: Secondary | ICD-10-CM

## 2021-01-02 DIAGNOSIS — R768 Other specified abnormal immunological findings in serum: Secondary | ICD-10-CM

## 2021-01-02 NOTE — Progress Notes (Signed)
Office Visit Note  Patient: Jodi Ross             Date of Birth: February 04, 1976           MRN: [id number]             PCP: Lorre Munroe, NP Referring: Lorre Munroe, NP Visit Date: 01/02/2021   Subjective:  New Patient (Initial Visit) (Abnormal labs)   History of Present Illness: Jodi Ross is a 45 y.o. female here for evaluation of positive ANA after development of rashes on her scalp and on her trunk.  The symptoms started around December of last year or not associated with any illness or other medical changes around that time.  Continued and she was referred to dermatology for evaluation as well as lab test collected showing highly positive ANA antibodies.  Besides the rash she also describes night sweats but no associated lymphadenopathy weight change or other systemic symptoms.  She was seen by dermatology last month with findings felt to represent pityriasis and seborrheic dermatitis at that time and treated using topical steroids.  She has experienced a large improvement in the symptoms and is currently free of effectively all of the rash.  She does continue experiencing these night sweats and frequently feels cold, but otherwise no ongoing symptoms.  Her original appointment was unable to be seen due to our clinic running behind schedule and she was unable to wait due to conflict with following appointment elsewhere.   Labs reviewed 10/2020 ANA 1:320 cytoplasmic, speckled 1:640 DFS APS Abs neg SPEP unremarkable DsDNA neg C3 C4 wnl  RF neg ESR wnl CRP wln  Activities of Daily Living:  Patient reports morning stiffness for 0 none.   Patient Denies nocturnal pain.  Difficulty dressing/grooming: Denies Difficulty climbing stairs: Denies Difficulty getting out of chair: Denies Difficulty using hands for taps, buttons, cutlery, and/or writing: Reports  Review of Systems  Constitutional: Positive for fatigue.  HENT: Negative for mouth dryness.   Eyes: Negative  for dryness.  Respiratory: Negative for shortness of breath.   Cardiovascular: Negative for swelling in legs/feet.  Gastrointestinal: Negative for constipation.  Endocrine: Positive for cold intolerance.  Genitourinary: Negative for difficulty urinating.  Musculoskeletal: Negative for arthralgias and joint pain.  Skin: Negative for rash.  Allergic/Immunologic: Negative for susceptible to infections.  Neurological: Negative for weakness.  Hematological: Negative for bruising/bleeding tendency.  Psychiatric/Behavioral: Negative for sleep disturbance.    PMFS History:  Patient Active Problem List   Diagnosis Date Noted  . Positive ANA (antinuclear antibody) 01/02/2021  . 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 Night sweats 01/02/2021  . GERD (gastroesophageal reflux disease) 04/03/2020  . Essential hypertension 08/24/2010  . Allergic rhinitis 08/24/2010    Past Medical History:  Diagnosis Date  . Allergy   . Anemia, iron deficiency   . G6PD (glucose 6 phosphatase deficiency)   . G6PD deficiency   . Hypertension     Family History  Problem Relation Age of Onset  . Lupus Mother   . Hypertension Father   . Parkinson's disease Father   . Lupus Maternal Grandmother   . Healthy Daughter   . Healthy Son    Past Surgical History:  Procedure Laterality Date  . BUNIONECTOMY Left 2020   Social History   Social History Narrative  . Not on file   Immunization History  Administered Date(s) Administered  . Influenza,inj,Quad PF,6+ Mos 08/03/2018, 07/24/2020  . PFIZER(Purple Top)SARS-COV-2 Vaccination 02/29/2020, 03/22/2020, 10/21/2020  .  Tdap 02/20/2018, 08/03/2018     Objective: Vital Signs: BP 127/84 (BP Location: Right Arm, Patient Position: Sitting, Cuff Size: Normal)   Pulse 81   Resp 14   Ht 5\' 4"  (1.626 m)   Wt 154 lb 3.2 oz (69.9 kg)   BMI 26.47 kg/m    Physical Exam HENT:     Right Ear: External ear normal.     Left Ear: External ear normal.     Mouth/Throat:      Mouth: Mucous membranes are moist.     Pharynx: Oropharynx is clear.  Eyes:     Conjunctiva/sclera: Conjunctivae normal.  Cardiovascular:     Rate and Rhythm: Normal rate and regular rhythm.  Pulmonary:     Effort: Pulmonary effort is normal.     Breath sounds: Normal breath sounds.  Skin:    General: Skin is warm and dry.     Findings: No rash.  Neurological:     General: No focal deficit present.     Mental Status: She is alert.     Musculoskeletal Exam:  Neck full ROM no tenderness Shoulders full ROM no tenderness or swelling Elbows full ROM no tenderness or swelling Wrists full ROM no tenderness or swelling Fingers full ROM no tenderness or swelling No tenderness to lateral hip palpation Knees full ROM no tenderness or swelling Ankles full ROM no tenderness or swelling Bunionectomy scar on left 1st MTP, good ROM, right 1st MTP bunion with toes slight crowding   Investigation: No additional findings.  Imaging: No results found.  Recent Labs: Lab Results  Component Value Date   WBC 7.5 08/07/2020   HGB 11.3 (L) 08/07/2020   PLT 299.0 08/07/2020   NA 137 08/07/2020   K 3.5 08/07/2020   CL 104 08/07/2020   CO2 27 08/07/2020   GLUCOSE 84 08/07/2020   BUN 10 08/07/2020   CREATININE 0.83 08/07/2020   BILITOT 0.6 08/07/2020   ALKPHOS 29 (L) 08/07/2020   AST 21 08/07/2020   ALT 21 08/07/2020   PROT 7.3 11/13/2020   ALBUMIN 3.8 08/07/2020   CALCIUM 10.1 08/07/2020    Speciality Comments: No specialty comments available.  Procedures:  No procedures performed Allergies: Aspirin and Sulfonamide derivatives   Assessment / Plan:     Visit Diagnoses: Positive ANA (antinuclear antibody) - Plan: TSH, IgG, IgA, IgM, Urinalysis, CBC with Differential/Platelet, Urinalysis, Routine w reflex microscopic  Considerably positive ANA titers but does not present clinical criteria of systemic lupus by exam and history today.  She describes night sweats but no unexplained  weight loss, lymphadenopathy, or abnormalities on her previous blood test.  This can represent preclinical disease so would encourage her to return for further evaluation with symptoms becoming much worse or developing new problems.  We also check urinalysis for any evidence of nephritis or proteinuria.  We will check TSH with descriptions of cold intolerance and sometimes associated with the DFS ANA titer.  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  Currently there is no visible rash in examined areas of head trunk or extremities.  Night sweats - Plan: TSH, IgG, IgA, IgM, Urinalysis, CBC with Differential/Platelet, Urinalysis, Routine w reflex microscopic  Unexplained night sweats for months without associated symptoms or changes and has an unremarkable serum protein electrophoresis.  Also repeat blood counts also check immunoglobulin titers.  She does not have  any family history of lymphoma or of early age cancers in the family.  Orders: Orders Placed This Encounter  Procedures  . TSH  . IgG, IgA, IgM  . Urinalysis  . CBC with Differential/Platelet  . Urinalysis, Routine w reflex microscopic   No orders of the defined types were placed in this encounter.   Follow-Up Instructions: Return if symptoms worsen or fail to improve.   Fuller Plan, MD  Note - This record has been created using [employer].  Chart creation errors have been sought, but may not always  have been located. Such creation errors do not reflect on  the standard of medical care.

## 2021-01-02 NOTE — Patient Instructions (Signed)
Immunoglobulin Quantification Test Why am I having this test? The immunoglobulin (Ig) quantification test is used to detect and monitor various diseases, including infections, chronic liver disease, some cancers, autoimmune diseases, and acquired immunodeficiency syndrome (AIDS). What is being tested? This test checks for the concentration of immune system proteins (antibodies) called immunoglobulins in the blood. They include IgG, IgM, IgA, IgD, and IgE. Immunoglobulin levels may increase for a number of reasons, including the presence of certain cancers. In these types of cancer, the cells that produce immunoglobulins (plasma cells) divide rapidly and release more immunoglobulins. Decreased immunoglobulin levels are often found in people with a deficiency in their immune system that could be due to a disease or treatment for a disease. What kind of sample is taken? A blood sample is required for this test. It is usually collected by inserting a needle into a blood vessel.   How are the results reported? Your test results will be reported as values. Your health care provider will compare your results to normal ranges that were established after testing a large group of people (reference ranges). Reference ranges may vary among labs and hospitals. For this test, common reference ranges are:  Immunoglobulin G (IgG). ? Adults: 841-6,606 mg/dL. ? Children: 250-1,600 mg/dL.  Immunoglobulin A (IgA). ? Adults: 85-385 mg/dL. ? Children: 1-350 mg/dL.  Immunoglobulin M (IgM). ? Adults: 55-375 mg/dL. ? Children: 20-200 mg/dL.  Immunoglobulin D (IgD) and Immunoglobulin E (IgE). ? Minimal. What do the results mean?  Levels of IgG that are higher than the reference range may indicate: ? Different infections. ? Autoimmune diseases. ? Chronic liver disease.  Levels of IgG that are lower than the reference range may be associated with: ? AIDS. ? Different types of cancer. ? Various causes of  suppressed immunity, including medications or treatments for diseases such as cancer.  Levels of IgA that are higher than the reference range may indicate: ? Chronic liver diseases. ? Chronic infections.  Levels of IgA that are lower than the reference range may be associated with: ? Various causes of immunoglobulin deficiency, including medications or treatments for diseases such as cancer.  Levels of IgM that are higher than the reference range may indicate: ? Certain rare cancers. ? Different infections. ? Autoimmune diseases. ? Chronic liver conditions.  Levels of IgM that are lower than the reference range may be associated with: ? AIDS. ? Various causes of immunoglobulin deficiency. ? Various causes of suppressed immunity, including medications or treatments for diseases such as cancer.  Levels of IgE that are higher than the reference range may indicate: ? Allergic reactions or allergic infections.  Levels of IgE that are lower than the reference range may indicate: ? Inherited immunoglobulin deficiency. Talk with your health care provider about what your results mean. Questions to ask your health care provider Ask your health care provider, or the department that is doing the test:  When will my results be ready?  How will I get my results?  What are my treatment options?  What other tests do I need?  What are my next steps? Summary  The immunoglobulin quantification test is performed to detect and monitor various diseases.  Immunoglobulins (Ig) are a type of antibody in the blood. They include IgG, IgM, IgA, IgD, and IgE.  The levels of these antibodies may increase due to a number of conditions, such as in certain cancers.  The levels of these antibodies may decrease because of a problem in the immune system.  Talk  with your health care provider about what your results may mean.    Urinalysis Test Why am I having this test? You may have a urinalysis  (UA) test:  As part of routine wellness screening.  Before surgery.  During pregnancy. You may also need to have this test if you have:  Kidney disease.  Symptoms of a urinary tract infection (UTI).  Diabetes.  A condition that causes an imbalance in your hormones. What is being tested? A urinalysis is a series of tests done on a sample of your urine. Your kidneys filter your blood to make urine. They get rid of waste products and save the important parts of your blood, such as proteins and minerals (electrolytes). You may need more testing if your UA shows too much:  Protein.  Sugar (glucose).  Blood cells.  Bacteria. What kind of sample is taken? A urine sample is required for this test. How do I collect samples at home? You usually collect a urine sample by urinating into a clean cup. You may be asked to collect a urine sample first thing in the morning. When collecting a urine sample at home, make sure you:  Use supplies and instructions that you received from the lab.  Collect urine only in the germ-free (sterile) cup that you received from the lab.  Do not let any toilet paper or stool (feces) get into the cup.  Refrigerate the sample until you can return it to the lab.  Return the sample or samples to the lab as told.   Tell a health care provider about:  All medicines you are taking, including vitamins, herbs, eye drops, creams, and over-the-counter medicines.  Any medical conditions you have.  Whether you are pregnant or may be pregnant. What happens during the test? UA is divided into three parts:  A visual exam of your urine sample to check for color or cloudiness.  A dipstick test to check for: ? Proteins. ? Concentration (specific gravity). ? Acidity (pH). ? Glucose. ? Ketones. These are by-products of your body burning fat for energy instead of sugar. ? A waste product from red blood cells (bilirubin). ? A product of white blood cells  (leukocyte esterase). ? A product of bacteria (nitrite). ? Blood.  A microscopic exam to check for: ? Red blood cells. ? White blood cells. ? Tube-shaped proteins (hyaline casts). ? Crystal structures. ? Bacteria. ? Epithelial cells. These are cells that line your urinary tract. ? Yeast. How are the results reported? Some of your test results will be reported as values. Your health care provider will compare your results to normal ranges that were established after testing a large group of people (reference ranges). Reference ranges may vary among different labs and hospitals. For this test, common reference ranges are:  pH: 4.6-8.0 (average, 6.0).  Protein: ? 0-8 mg/dL. ? 50-80 mg/24 hr (at rest). ? Less than 250 mg/24 hr (during exercise).  Specific gravity: ? Adult: 1.005-1.030 (usually, 1.010-1.025). ? Elderly: values decrease with age. ? Newborn: 1.001-1.020.  Nitrites: none.  Ketones: none.  Bilirubin: none.  Urobilinogen: 0.01-1 Ehrlich unit/mL.  Crystals: none.  Casts: none.  Glucose: ? Fresh specimen: none. ? 24-hour specimen: 50-300 mg/24 hr or 0.3-1.7 mmol/day (SI units).  White blood cells (WBCs): 0-4 per low-power field.  WBC casts: none.  Red blood cells (RBCs): Less than or equal to 2.  RBC casts: none.  Epithelial cells: Few or 0-4 per low-power field.  Bacteria: none.  Yeast: none.  Other results may be reported based on the appearance and odor of the sample. For this test, normal results are:  Appearance: clear.  Color: amber yellow.  Odor: aromatic. Still other results may be reported as positive or negative. For this test, normal results are:  Negative for leukocyte esterase. What do the results mean? Many conditions can cause abnormal UA results:  Cloudy urine may be a sign of a UTI.  Acetone odor may indicate a buildup of blood acids in people who have diabetes (diabetic ketoacidosis).  Fecal odor can indicate an abnormal  connection (fistula) between the intestine and the bladder.  Ammonia odor can occur after a person holds urine in the bladder for too long.  Pungent odor may indicate a UTI.  Blood in the urine may be a sign of kidney disease, UTI, or other conditions.  White blood cells may be a sign of a UTI.  Crystals may be a sign of a kidney stone or other kidney disease.  High pH may mean you have a kidney stone, UTI, or kidney disease.  Protein may be a sign of kidney disease, high blood pressure in pregnancy (toxemia), or other conditions.  Glucose may be a sign of diabetes.  Urobilinogen may be a sign of liver disease.  Leukocyte esterase may indicate a UTI.  Nitrites may indicate a UTI. Talk with your health care provider about what your results mean. Questions to ask your health care provider Ask your health care provider, or the department that is doing the test:  When will my results be ready?  How will I get my results?  What are my treatment options?  What other tests do I need?  What are my next steps? Summary  A urinalysis (UA) is a series of tests done on a sample of your urine. The test may be ordered as part of a routine exam, during pregnancy, before surgery, or if you have certain symptoms.  The urinalysis is divided into three parts: a visual exam, a dipstick test, and a microscopic exam.  Your health care provider will compare your results to normal ranges that were established after testing a large group of people (reference ranges).  Talk with your health care provider about what your results mean.

## 2021-01-03 LAB — CBC WITH DIFFERENTIAL/PLATELET
Absolute Monocytes: 348 cells/uL (ref 200–950)
Basophils Absolute: 52 cells/uL (ref 0–200)
Basophils Relative: 0.7 %
Eosinophils Absolute: 67 cells/uL (ref 15–500)
Eosinophils Relative: 0.9 %
HCT: 37.5 % (ref 35.0–45.0)
Hemoglobin: 11.8 g/dL (ref 11.7–15.5)
Lymphs Abs: 2324 cells/uL (ref 850–3900)
MCH: 29.7 pg (ref 27.0–33.0)
MCHC: 31.5 g/dL — ABNORMAL LOW (ref 32.0–36.0)
MCV: 94.5 fL (ref 80.0–100.0)
MPV: 9.6 fL (ref 7.5–12.5)
Monocytes Relative: 4.7 %
Neutro Abs: 4610 cells/uL (ref 1500–7800)
Neutrophils Relative %: 62.3 %
Platelets: 331 10*3/uL (ref 140–400)
RBC: 3.97 10*6/uL (ref 3.80–5.10)
RDW: 11.3 % (ref 11.0–15.0)
Total Lymphocyte: 31.4 %
WBC: 7.4 10*3/uL (ref 3.8–10.8)

## 2021-01-03 LAB — IGG, IGA, IGM
IgG (Immunoglobin G), Serum: 1728 mg/dL — ABNORMAL HIGH (ref 600–1640)
IgM, Serum: 168 mg/dL (ref 50–300)
Immunoglobulin A: 238 mg/dL (ref 47–310)

## 2021-01-03 LAB — TSH: TSH: 0.66 mIU/L

## 2021-01-05 NOTE — Progress Notes (Signed)
Lab results show no abnormal thyroid function or blood count. The serum IgG is slightly high, but it is a very small amount. This does not concern me for malignancy, with the previous negative protein electrophoresis. I recommend still checking the urinalysis just to make sure not missing anything but from the current findings I would just observe and can take return as needed if symptoms become worse.

## 2021-02-06 ENCOUNTER — Other Ambulatory Visit: Payer: Self-pay

## 2021-02-06 MED FILL — Etonogestrel-Ethinyl Estradiol VA Ring 0.12-0.015 MG/24HR: VAGINAL | 84 days supply | Qty: 3 | Fill #0 | Status: AC

## 2021-02-09 ENCOUNTER — Other Ambulatory Visit: Payer: Self-pay

## 2021-02-13 ENCOUNTER — Other Ambulatory Visit: Payer: Self-pay

## 2021-02-20 ENCOUNTER — Other Ambulatory Visit: Payer: Self-pay

## 2021-02-20 MED FILL — Loratadine & Pseudoephedrine Tab ER 24HR 10-240 MG: ORAL | 90 days supply | Qty: 90 | Fill #0 | Status: CN

## 2021-02-24 ENCOUNTER — Other Ambulatory Visit: Payer: Self-pay

## 2021-02-24 MED FILL — Loratadine & Pseudoephedrine Tab ER 24HR 10-240 MG: ORAL | 90 days supply | Qty: 90 | Fill #0 | Status: AC

## 2021-02-25 ENCOUNTER — Other Ambulatory Visit: Payer: Self-pay

## 2021-02-26 ENCOUNTER — Other Ambulatory Visit: Payer: Self-pay

## 2021-03-03 ENCOUNTER — Other Ambulatory Visit: Payer: Self-pay

## 2021-03-03 MED FILL — Amlodipine Besylate Tab 10 MG (Base Equivalent): ORAL | 90 days supply | Qty: 90 | Fill #0 | Status: AC

## 2021-05-04 ENCOUNTER — Telehealth: Payer: Self-pay

## 2021-05-04 NOTE — Telephone Encounter (Signed)
Pt c/o a boil on upper thigh. Scheduled pt for apt with Irven [facility] for 7:20 tomorrow. Pt verbalized understanding.

## 2021-05-05 NOTE — Telephone Encounter (Signed)
Apparently when pt call back she told the scheduler she could make it at 720 so the scheduler told her the soonest available was next Monday.  Contacted pt this morning and advised a sooner apt could be scheduled with Dr Ermalene Searing on 7/21. Pt was appreciative and accepted apt on 7/21.

## 2021-05-05 NOTE — Telephone Encounter (Signed)
Noted  

## 2021-05-05 NOTE — Telephone Encounter (Signed)
Patient was not on schedule for 7:20 am on 05/05/21, was not evaluated. I see she has an appointment with Dr. Selena Batten for 07/25.

## 2021-05-07 ENCOUNTER — Ambulatory Visit: Payer: No Typology Code available for payment source | Admitting: Family Medicine

## 2021-05-07 ENCOUNTER — Other Ambulatory Visit: Payer: Self-pay

## 2021-05-11 ENCOUNTER — Ambulatory Visit: Payer: No Typology Code available for payment source | Admitting: Family Medicine

## 2021-05-11 ENCOUNTER — Other Ambulatory Visit: Payer: Self-pay

## 2021-05-11 MED FILL — Etonogestrel-Ethinyl Estradiol VA Ring 0.12-0.015 MG/24HR: VAGINAL | 84 days supply | Qty: 3 | Fill #1 | Status: AC

## 2021-05-13 ENCOUNTER — Other Ambulatory Visit: Payer: Self-pay

## 2021-05-13 ENCOUNTER — Ambulatory Visit (INDEPENDENT_AMBULATORY_CARE_PROVIDER_SITE_OTHER): Payer: No Typology Code available for payment source | Admitting: Family Medicine

## 2021-05-13 ENCOUNTER — Encounter: Payer: Self-pay | Admitting: Family Medicine

## 2021-05-13 VITALS — BP 120/78 | HR 88 | Temp 97.7°F | Ht 64.0 in | Wt 159.0 lb

## 2021-05-13 DIAGNOSIS — R768 Other specified abnormal immunological findings in serum: Secondary | ICD-10-CM

## 2021-05-13 DIAGNOSIS — L02429 Furuncle of limb, unspecified: Secondary | ICD-10-CM | POA: Diagnosis not present

## 2021-05-13 LAB — POCT URINALYSIS DIPSTICK
Bilirubin, UA: NEGATIVE
Glucose, UA: NEGATIVE
Ketones, UA: NEGATIVE
Leukocytes, UA: NEGATIVE
Nitrite, UA: NEGATIVE
Protein, UA: POSITIVE — AB
Spec Grav, UA: 1.025 (ref 1.010–1.025)
Urobilinogen, UA: 0.2 E.U./dL
pH, UA: 6 (ref 5.0–8.0)

## 2021-05-13 NOTE — Progress Notes (Signed)
Thanks for collecting this study. She has a fairly high ANA antibody titer but with no clinical disease on exam when I saw her. I don't have a very high suspicion but wanting to rule out nephritis ideally would find no blood or protein, since P/C ratio over 0.5 would warrant some more investigating with the positive ANAs. Would she be having any kind of routine follow up soon? Otherwise I can call her about it.

## 2021-05-13 NOTE — Assessment & Plan Note (Addendum)
Reviewed rheumatology note. Will get UA and forward results. UA with protein, message to Dr. Dimple Casey with [facility]

## 2021-05-13 NOTE — Assessment & Plan Note (Signed)
Actively draining. Discussed we could lance today but given drainage - advised trial of warm compress and compression. Return precautions discussed. If recurring in same location could consider derm referral

## 2021-05-13 NOTE — Patient Instructions (Signed)
Boil - this is draining which is good - use warm compress 10-15 minutes 2-3 times a day and work to drain while doing warm compress  Monitor for redness, worsening pain or swelling, increase in size.

## 2021-05-13 NOTE — Progress Notes (Signed)
   Subjective:     Jodi Ross is a 45 y.o. female presenting for boil on inner thigh     HPI  #boil - started 1 month ago - started draining 1 week ago for 1 day - stopped draining - started draining again - treatment: none - no history of boils in the past   #positive ANA and rash - saw rheum - they recommended UA - wondering if she can get this done - no symptoms  - had rash which resolved  Review of Systems   Social History   Tobacco Use  Smoking Status Never  Smokeless Tobacco Never        Objective:    BP Readings from Last 3 Encounters:  05/13/21 120/78  01/02/21 127/84  12/04/20 (!) 141/92   Wt Readings from Last 3 Encounters:  05/13/21 159 lb (72.1 kg)  01/02/21 154 lb 3.2 oz (69.9 kg)  12/04/20 154 lb 3.2 oz (69.9 kg)    BP 120/78   Pulse 88   Temp 97.7 F (36.5 C) (Temporal)   Ht 5\' 4"  (1.626 m)   Wt 159 lb (72.1 kg)   SpO2 98%   BMI 27.29 kg/m    Physical Exam Constitutional:      General: She is not in acute distress.    Appearance: She is well-developed. She is not diaphoretic.  HENT:     Right Ear: External ear normal.     Left Ear: External ear normal.     Nose: Nose normal.  Eyes:     Conjunctiva/sclera: Conjunctivae normal.  Cardiovascular:     Rate and Rhythm: Normal rate.  Pulmonary:     Effort: Pulmonary effort is normal.  Musculoskeletal:     Cervical back: Neck supple.  Skin:    General: Skin is warm and dry.     Capillary Refill: Capillary refill takes less than 2 seconds.     Comments: Left inner thigh: purulent draining lesion w/o erythema. Tender with compression but draining actively  Neurological:     Mental Status: She is alert. Mental status is at baseline.  Psychiatric:        Mood and Affect: Mood normal.        Behavior: Behavior normal.          Assessment & Plan:   Problem List Items Addressed This Visit       Musculoskeletal and Integument   Boil of leg except foot    Actively  draining. Discussed we could lance today but given drainage - advised trial of warm compress and compression. Return precautions discussed. If recurring in same location could consider derm referral         Other   Positive ANA (antinuclear antibody) - Primary    Reviewed rheumatology note. Will get UA and forward results. UA with protein, message to Dr. with Rheum       Relevant Orders   POCT urinalysis dipstick (Completed)     I spent >15 minutes with pt , obtaining history, examining, reviewing chart, documenting encounter and discussing the above plan of care.   Return if symptoms worsen or fail to improve.  Dimple Casey, MD  This visit occurred during the SARS-CoV-2 public health emergency.  Safety protocols were in place, including screening questions prior to the visit, additional usage of staff PPE, and extensive cleaning of exam room while observing appropriate contact time as indicated for disinfecting solutions.

## 2021-05-15 ENCOUNTER — Other Ambulatory Visit: Payer: Self-pay | Admitting: *Deleted

## 2021-05-15 DIAGNOSIS — L210 Seborrhea capitis: Secondary | ICD-10-CM

## 2021-05-15 DIAGNOSIS — R61 Generalized hyperhidrosis: Secondary | ICD-10-CM

## 2021-05-15 DIAGNOSIS — R768 Other specified abnormal immunological findings in serum: Secondary | ICD-10-CM

## 2021-05-15 NOTE — Progress Notes (Signed)
Jodi Ross had a urinalysis checked for screening of any possible kidney problem with her positive lab results earlier this year. This did show some amount of protein I would recommend getting another test checked to measure this with the urine protein/creatinine ratio. Would Jodi Ross be able to come leave a specimen or at a local lab for her?

## 2021-05-15 NOTE — Progress Notes (Signed)
Okay, thanks. We will contact her about following up this result.

## 2021-05-19 ENCOUNTER — Other Ambulatory Visit: Payer: Self-pay | Admitting: *Deleted

## 2021-05-19 DIAGNOSIS — R61 Generalized hyperhidrosis: Secondary | ICD-10-CM

## 2021-05-19 DIAGNOSIS — L210 Seborrhea capitis: Secondary | ICD-10-CM

## 2021-05-19 DIAGNOSIS — R768 Other specified abnormal immunological findings in serum: Secondary | ICD-10-CM

## 2021-05-20 LAB — PROTEIN / CREATININE RATIO, URINE
Creatinine, Urine: 100 mg/dL (ref 20–275)
Protein/Creat Ratio: 130 mg/g creat (ref 21–161)
Protein/Creatinine Ratio: 0.13 mg/mg creat (ref 0.021–0.161)
Total Protein, Urine: 13 mg/dL (ref 5–24)

## 2021-07-09 ENCOUNTER — Other Ambulatory Visit: Payer: Self-pay | Admitting: Internal Medicine

## 2021-07-09 ENCOUNTER — Other Ambulatory Visit: Payer: Self-pay

## 2021-07-09 MED FILL — Amlodipine Besylate Tab 10 MG (Base Equivalent): ORAL | 90 days supply | Qty: 90 | Fill #0 | Status: AC

## 2021-07-09 NOTE — Telephone Encounter (Signed)
Requested medication (s) are due for refill today: Yes  Requested medication (s) are on the active medication list: Yes  Last refill:  04/03/20  Future visit scheduled: Yes  Notes to clinic:  See request.    Requested Prescriptions  Pending Prescriptions Disp Refills   amLODipine (NORVASC) 10 MG tablet 90 tablet 3    Sig: TAKE 1 TABLET (10 MG TOTAL) BY MOUTH DAILY.     There is no refill protocol information for this order

## 2021-07-14 ENCOUNTER — Other Ambulatory Visit: Payer: Self-pay | Admitting: Internal Medicine

## 2021-07-14 ENCOUNTER — Telehealth: Payer: Self-pay | Admitting: Internal Medicine

## 2021-07-14 ENCOUNTER — Ambulatory Visit: Payer: No Typology Code available for payment source | Admitting: Family Medicine

## 2021-07-14 MED ORDER — ETONOGESTREL-ETHINYL ESTRADIOL 0.12-0.015 MG/24HR VA RING
VAGINAL_RING | VAGINAL | 3 refills | Status: DC
  Filled 2021-07-14: qty 3, 84d supply, fill #0
  Filled 2021-11-12: qty 3, 84d supply, fill #1
  Filled 2022-02-05: qty 3, 84d supply, fill #2
  Filled 2022-05-14: qty 3, 84d supply, fill #3

## 2021-07-14 NOTE — Telephone Encounter (Signed)
Last refill 08/07/20 #3/3 refill Last office visit with PCP 11/13/20 acute Last CPE 08/07/20 No upcoming appointment scheduled

## 2021-07-14 NOTE — Telephone Encounter (Signed)
Pt call in requesting to reschedule her appointment with Dr. Selena Batten . Do not want to wait for a TOC stated she needs to have her pap smear done . Please Advise [phone]

## 2021-07-14 NOTE — Telephone Encounter (Signed)
Reviewed records from [facility] 06/2019 - pap was normal.   Pap not due until 06/2022  At this point would recommend she schedule with Tabitha for CPE and TOC if no other concerns.   This is not an urgent need or visit. I can wait.   Routing to manager as well since this patient was already advised to reschedule today and in chart review she is not even due for annual until 08/07/2021

## 2021-07-14 NOTE — Telephone Encounter (Signed)
  Encourage patient to contact the pharmacy for refills or they can request refills through [facility]  LAST APPOINTMENT DATE:  Please schedule appointment if longer than 1 year  NEXT APPOINTMENT DATE:  MEDICATION:etonogestrel-ethinyl estradiol (NUVARING) 0.12-0.015 MG/24HR vaginal ring  Is the patient out of medication? I month supply  [facility]  Let patient know to contact pharmacy at the end of the day to make sure medication is ready.  Please notify patient to allow 48-72 hours to process  CLINICAL FILLS OUT ALL BELOW:   LAST REFILL:  QTY:  REFILL DATE:    OTHER COMMENTS:    Okay for refill?  Please advise

## 2021-07-15 ENCOUNTER — Other Ambulatory Visit: Payer: Self-pay

## 2021-07-15 NOTE — Telephone Encounter (Addendum)
I will address this tomorrow as it is clear patient has clinical questions and concerns.  If I cannot answer and patient is requesting to see a provider for her concerns, I will not deny her care and will look in to getting her in for an appointment to discuss her care.

## 2021-07-16 NOTE — Telephone Encounter (Signed)
Spoke with patient.  She is highly anxious to have PAP Smear completed.  States she is not having any issues but has had abnormal pap smears in the past and is not comfortable waiting until later in December when the new provider will be here.   I discussed with her Dr. Elmyra Ricks message regarding when she will be due based on previous normal PAP.  She understands this but is wanting to discuss face to face with provider and see if , for her peace of mind, it can be done within the next month.   Also, she had an "inconclusive" mammogram over 1 year ago and, again, is very anxious to have this completed as well as have her NuvaRing refilled as she will be out in October.  She feels like it usually takes a while to get the mammogram scheduled and is eager to go ahead and get this ordered.    She is asking for a female provider to review these concerns with and would be happy to drive to [facility] to [place] for these needs.   I will forward this patient care concern to Dr. Ermalene Searing, Dr. Milinda Antis, and Mayra Reel, NP to see if either will be willing to see the patient for these questions and needs and then have patient set up her TOC with Tabitha when she starts and we are back at [facility] in December. ( It is my understanding that Neysa Bonito CMA has requested [facility] records).  Dr. Doyne Keel, NP/Dr. Milinda Antis   Will either of you see patient for the PAP/mammo order and NuvaRing refill within the next month and then have her establish with Tabitha for the [facility] in December, once she is established and we are back at [facility]?  Patient would like to at least be able to come in and discuss her needs and get her refill.  If she truly does not need a PAP, then she will likely feel more supported by talking that through with the provider at the appointment.  Patient is insisting on being seen for care before Tabitha starts and does not want to delay this care.

## 2021-07-17 ENCOUNTER — Other Ambulatory Visit: Payer: Self-pay

## 2021-07-17 NOTE — Telephone Encounter (Signed)
I am happy to see her to discuss her questions and concerns. Also happy to refill NuvaRing. Please have her scheduled at her convenience.

## 2021-07-21 NOTE — Telephone Encounter (Signed)
Pt returning call

## 2021-07-21 NOTE — Telephone Encounter (Signed)
Thank you both, Dr. Ermalene Searing and Mayra Reel, NP have approved to see patient.   I have left message with patient to get her schedule for PAP and med concern appt later this month to review her concerns.   She is to call back and ask to speak with me so that I can get her set up.   Thanks to all.

## 2021-07-22 NOTE — Telephone Encounter (Signed)
Called and scheduled pt for 08/18/21 with Dr. Ermalene Searing

## 2021-08-18 ENCOUNTER — Other Ambulatory Visit: Payer: Self-pay

## 2021-08-18 ENCOUNTER — Ambulatory Visit (INDEPENDENT_AMBULATORY_CARE_PROVIDER_SITE_OTHER): Payer: No Typology Code available for payment source | Admitting: Family Medicine

## 2021-08-18 ENCOUNTER — Encounter: Payer: Self-pay | Admitting: Family Medicine

## 2021-08-18 VITALS — BP 146/92 | HR 92 | Temp 97.8°F | Ht 63.5 in | Wt 159.0 lb

## 2021-08-18 DIAGNOSIS — Z13220 Encounter for screening for lipoid disorders: Secondary | ICD-10-CM

## 2021-08-18 DIAGNOSIS — K219 Gastro-esophageal reflux disease without esophagitis: Secondary | ICD-10-CM

## 2021-08-18 DIAGNOSIS — Z1159 Encounter for screening for other viral diseases: Secondary | ICD-10-CM

## 2021-08-18 DIAGNOSIS — Z0000 Encounter for general adult medical examination without abnormal findings: Secondary | ICD-10-CM | POA: Diagnosis not present

## 2021-08-18 DIAGNOSIS — Z1211 Encounter for screening for malignant neoplasm of colon: Secondary | ICD-10-CM

## 2021-08-18 DIAGNOSIS — J301 Allergic rhinitis due to pollen: Secondary | ICD-10-CM

## 2021-08-18 DIAGNOSIS — I10 Essential (primary) hypertension: Secondary | ICD-10-CM

## 2021-08-18 MED ORDER — LEVOCETIRIZINE DIHYDROCHLORIDE 5 MG PO TABS
5.0000 mg | ORAL_TABLET | Freq: Every evening | ORAL | 11 refills | Status: DC
  Filled 2021-08-18: qty 30, 30d supply, fill #0
  Filled 2021-10-24: qty 30, 30d supply, fill #1
  Filled 2021-12-19: qty 30, 30d supply, fill #2
  Filled 2022-02-25: qty 30, 30d supply, fill #3
  Filled 2022-05-05: qty 30, 30d supply, fill #4
  Filled 2022-06-01: qty 30, 30d supply, fill #5
  Filled 2022-07-17: qty 30, 30d supply, fill #6
  Filled 2022-08-04: qty 30, 30d supply, fill #7

## 2021-08-18 MED ORDER — PANTOPRAZOLE SODIUM 40 MG PO TBEC
40.0000 mg | DELAYED_RELEASE_TABLET | Freq: Every day | ORAL | 3 refills | Status: DC
  Filled 2021-08-18: qty 30, 30d supply, fill #0
  Filled 2022-02-10: qty 30, 30d supply, fill #1
  Filled 2022-06-01: qty 30, 30d supply, fill #2

## 2021-08-18 MED ORDER — SM LORATA-DINE D 10-240 MG PO TB24
1.0000 | ORAL_TABLET | Freq: Every day | ORAL | 0 refills | Status: DC
  Filled 2021-08-18: qty 30, 30d supply, fill #0

## 2021-08-18 NOTE — Assessment & Plan Note (Signed)
Inadequate control in office today.  May be worsened by Claritin D... she will limit use..  Follow at home... will call if > 140/90.

## 2021-08-18 NOTE — Assessment & Plan Note (Signed)
Stable, chronic.  Continue current medication.    pantoprazole 40 mg daily.

## 2021-08-18 NOTE — Patient Instructions (Addendum)
Return for fasting labs.  Call or email last flu shot date.  Pick up stool test for colon cancer on your way out.   Limit Claritin D as it can be contributing to your increased blood pressure... Can try instead Xyzal at bedtime Follow BP at home.Marland Kitchen goal < 140/90.  Work on [employer] and regular exercise.  Please call the location of your choice from the menu below to schedule your Mammogram and/or Bone Density appointment.     [facility]   Phone:  [phone]   [address]                                                                            [place], [place] [address]                                            Services: 3D Mammogram and Bone [facility])  Phone:  [phone]   [address]                        [place], [place] [address]                                              Services:  3D Mammogram and Bone Density

## 2021-08-18 NOTE — Progress Notes (Signed)
Patient ID: Jodi Ross, female    DOB: 06-09-1976, 45 y.o.   MRN: [id number]  This visit was conducted in person.  BP (!) 146/92   Pulse 92   Temp 97.8 F (36.6 C) (Temporal)   Ht 5' 3.5" (1.613 m)   Wt 159 lb (72.1 kg)   SpO2 98%   BMI 27.72 kg/m    CC: Chief Complaint  Patient presents with   Annual Exam    Subjective:   HPI: Jodi Ross is a 45 y.o. female presenting on 08/18/2021 for Annual Exam  Hypertension:    Inadequate control despite amlodipine 10 mg daily. BP Readings from Last 3 Encounters:  08/18/21 (!) 146/92  05/13/21 120/78  01/02/21 127/84  Using medication without problems or lightheadedness:  none Chest pain with exertion: none Edema: none Short of breath: none Average home BPs: Other issues:   Diet: more  Exercsie: steps a twork, increase as able Wt Readings from Last 3 Encounters:  08/18/21 159 lb (72.1 kg)  05/13/21 159 lb (72.1 kg)  01/02/21 154 lb 3.2 oz (69.9 kg)        Relevant past medical, surgical, family and social history reviewed and updated as indicated. Interim medical history since our last visit reviewed. Allergies and medications reviewed and updated. Outpatient Medications Prior to Visit  Medication Sig Dispense Refill   amLODipine (NORVASC) 10 MG tablet TAKE 1 TABLET (10 MG TOTAL) BY MOUTH DAILY. 90 tablet 3   etonogestrel-ethinyl estradiol (NUVARING) 0.12-0.015 MG/24HR vaginal ring INSERT 1 RING VAGINALLY AND LEAVE IN PLACE FOR 3 CONSECUTIVE WEEKS, THEN REMOVE FOR 1 WEEK. 3 each 3   fluticasone (FLONASE) 50 MCG/ACT nasal spray Place 2 sprays into both nostrils daily. (Patient taking differently: Place 2 sprays into both nostrils as needed.) 16 g 0   ibuprofen (ADVIL) 200 MG tablet Take 200 mg by mouth every 6 (six) hours as needed.     Loratadine-Pseudoephedrine (CLARITIN-D 24 HOUR PO) Take 1 tablet by mouth daily.     pantoprazole (PROTONIX) 40 MG tablet Take 1 tablet (40 mg total) by mouth daily. (Patient  taking differently: Take 40 mg by mouth as needed.) 30 tablet 3   No facility-administered medications prior to visit.     Per HPI unless specifically indicated in ROS section below Review of Systems  Constitutional:  Negative for fatigue and fever.  HENT:  Negative for congestion.   Eyes:  Negative for pain.  Respiratory:  Negative for cough and shortness of breath.   Cardiovascular:  Negative for chest pain, palpitations and leg swelling.  Gastrointestinal:  Negative for abdominal pain.  Genitourinary:  Negative for dysuria and vaginal bleeding.  Musculoskeletal:  Negative for back pain.  Neurological:  Negative for syncope, light-headedness and headaches.  Psychiatric/Behavioral:  Negative for dysphoric mood.   Objective:  BP (!) 146/92   Pulse 92   Temp 97.8 F (36.6 C) (Temporal)   Ht 5' 3.5" (1.613 m)   Wt 159 lb (72.1 kg)   SpO2 98%   BMI 27.72 kg/m   Wt Readings from Last 3 Encounters:  08/18/21 159 lb (72.1 kg)  05/13/21 159 lb (72.1 kg)  01/02/21 154 lb 3.2 oz (69.9 kg)      Physical Exam Vitals and nursing note reviewed.  Constitutional:      General: She is not in acute distress.    Appearance: Normal appearance. She is well-developed. She is not ill-appearing or toxic-appearing.  HENT:  Head: Normocephalic.     Right Ear: Hearing, tympanic membrane, ear canal and external ear normal.     Left Ear: Hearing, tympanic membrane, ear canal and external ear normal.     Nose: Nose normal.  Eyes:     General: Lids are normal. Lids are everted, no foreign bodies appreciated.     Conjunctiva/sclera: Conjunctivae normal.     Pupils: Pupils are equal, round, and reactive to light.  Neck:     Thyroid: No thyroid mass or thyromegaly.     Vascular: No carotid bruit.     Trachea: Trachea normal.  Cardiovascular:     Rate and Rhythm: Normal rate and regular rhythm.     Heart sounds: Normal heart sounds, S1 normal and S2 normal. No murmur heard.   No gallop.   Pulmonary:     Effort: Pulmonary effort is normal. No respiratory distress.     Breath sounds: Normal breath sounds. No wheezing, rhonchi or rales.  Abdominal:     General: Bowel sounds are normal. There is no distension or abdominal bruit.     Palpations: Abdomen is soft. There is no fluid wave or mass.     Tenderness: There is no abdominal tenderness. There is no guarding or rebound.     Hernia: No hernia is present.  Musculoskeletal:     Cervical back: Normal range of motion and neck supple.  Lymphadenopathy:     Cervical: No cervical adenopathy.  Skin:    General: Skin is warm and dry.     Findings: No rash.  Neurological:     Mental Status: She is alert.     Cranial Nerves: No cranial nerve deficit.     Sensory: No sensory deficit.  Psychiatric:        Mood and Affect: Mood is not anxious or depressed.        Speech: Speech normal.        Behavior: Behavior normal. Behavior is cooperative.        Judgment: Judgment normal.      Results for orders placed or performed in visit on 05/19/21  Protein / creatinine ratio, urine  Result Value Ref Range   Creatinine, Urine 100 20 - 275 mg/dL   Protein/Creat Ratio 782 21 - 161 mg/g creat   Protein/Creatinine Ratio 0.130 0.021 - 0.161 mg/mg creat   Total Protein, Urine 13 5 - 24 mg/dL    This visit occurred during the SARS-CoV-2 public health emergency.  Safety protocols were in place, including screening questions prior to the visit, additional usage of staff PPE, and extensive cleaning of exam room while observing appropriate contact time as indicated for disinfecting solutions.   COVID 19 screen:  No recent travel or known exposure to COVID19 The patient denies respiratory symptoms of COVID 19 at this time. The importance of social distancing was discussed today.   Assessment and Plan   The patient's preventative maintenance and recommended screening tests for an annual wellness exam were reviewed in full today. Brought up  to date unless services declined.  Counselled on the importance of diet, exercise, and its role in overall health and mortality. The patient's FH and SH was reviewed, including their home life, tobacco status, and drug and alcohol status.   Vaccines: At [facility] in 07/2021, uptodate with tetanus Pap/DVE:  Normal 2020 ( per patient)  on Nuvaring Mammo:  09/2020 Colon: Due  Smoking Status:none ETOH/ drug use:  once a week/none  Hep C:  due  HIV screen:  none  Problem List Items Addressed This Visit     Allergic rhinitis     Limit Calritin D as may be contributing to HTN  Flonase does not work for her.  Try Xyzal instead.      Essential hypertension    Inadequate control in office today.  May be worsened by Claritin D... she will limit use..  Follow at home... will call if > 140/90.       GERD (gastroesophageal reflux disease)    Stable, chronic.  Continue current medication.    pantoprazole 40 mg daily.      Relevant Medications   pantoprazole (PROTONIX) 40 MG tablet   Other Visit Diagnoses     Routine general medical examination at a health care facility    -  Primary   Colon cancer screening       Relevant Orders   Fecal occult blood, imunochemical   Screening cholesterol level       Relevant Orders   Lipid panel   Comprehensive metabolic panel   Need for hepatitis C screening test       Relevant Orders   Hepatitis C antibody        Kerby Nora, MD

## 2021-08-18 NOTE — Assessment & Plan Note (Signed)
Limit Calritin D as may be contributing to HTN  Flonase does not work for her.  Try Xyzal instead.

## 2021-08-19 ENCOUNTER — Other Ambulatory Visit: Payer: Self-pay

## 2021-08-20 ENCOUNTER — Other Ambulatory Visit: Payer: Self-pay

## 2021-08-20 ENCOUNTER — Other Ambulatory Visit (INDEPENDENT_AMBULATORY_CARE_PROVIDER_SITE_OTHER): Payer: No Typology Code available for payment source

## 2021-08-20 DIAGNOSIS — Z13220 Encounter for screening for lipoid disorders: Secondary | ICD-10-CM | POA: Diagnosis not present

## 2021-08-20 DIAGNOSIS — Z1159 Encounter for screening for other viral diseases: Secondary | ICD-10-CM

## 2021-08-21 ENCOUNTER — Other Ambulatory Visit (INDEPENDENT_AMBULATORY_CARE_PROVIDER_SITE_OTHER): Payer: No Typology Code available for payment source

## 2021-08-21 DIAGNOSIS — Z1211 Encounter for screening for malignant neoplasm of colon: Secondary | ICD-10-CM | POA: Diagnosis not present

## 2021-08-21 LAB — COMPREHENSIVE METABOLIC PANEL
ALT: 24 U/L (ref 0–35)
AST: 19 U/L (ref 0–37)
Albumin: 4 g/dL (ref 3.5–5.2)
Alkaline Phosphatase: 35 U/L — ABNORMAL LOW (ref 39–117)
BUN: 14 mg/dL (ref 6–23)
CO2: 24 mEq/L (ref 19–32)
Calcium: 10.1 mg/dL (ref 8.4–10.5)
Chloride: 105 mEq/L (ref 96–112)
Creatinine, Ser: 0.88 mg/dL (ref 0.40–1.20)
GFR: 79.6 mL/min (ref >60.00)
Glucose, Bld: 88 mg/dL (ref 70–99)
Potassium: 3.9 mEq/L (ref 3.5–5.1)
Sodium: 137 mEq/L (ref 135–145)
Total Bilirubin: 0.5 mg/dL (ref 0.2–1.2)
Total Protein: 7.7 g/dL (ref 6.0–8.3)

## 2021-08-21 LAB — HEPATITIS C ANTIBODY
Hepatitis C Ab: NONREACTIVE
SIGNAL TO CUT-OFF: 0.15 (ref <1.00)

## 2021-08-21 LAB — LIPID PANEL
Cholesterol: 211 mg/dL — ABNORMAL HIGH (ref 0–200)
HDL: 101.2 mg/dL (ref >39.00)
LDL Cholesterol: 94 mg/dL (ref 0–99)
NonHDL: 110.1
Total CHOL/HDL Ratio: 2
Triglycerides: 79 mg/dL (ref 0.0–149.0)
VLDL: 15.8 mg/dL (ref 0.0–40.0)

## 2021-08-21 LAB — FECAL OCCULT BLOOD, IMMUNOCHEMICAL: Fecal Occult Bld: NEGATIVE

## 2021-08-24 ENCOUNTER — Other Ambulatory Visit: Payer: Self-pay | Admitting: Family Medicine

## 2021-08-24 DIAGNOSIS — Z1231 Encounter for screening mammogram for malignant neoplasm of breast: Secondary | ICD-10-CM

## 2021-09-24 ENCOUNTER — Other Ambulatory Visit: Payer: Self-pay

## 2021-09-24 ENCOUNTER — Ambulatory Visit
Admission: RE | Admit: 2021-09-24 | Discharge: 2021-09-24 | Disposition: A | Discharge status: Home / Self Care | Payer: No Typology Code available for payment source | Source: Ambulatory Visit | Attending: Family Medicine | Admitting: Family Medicine

## 2021-09-24 DIAGNOSIS — Z1231 Encounter for screening mammogram for malignant neoplasm of breast: Secondary | ICD-10-CM | POA: Insufficient documentation

## 2021-10-26 ENCOUNTER — Other Ambulatory Visit: Payer: Self-pay

## 2021-11-09 ENCOUNTER — Other Ambulatory Visit: Payer: Self-pay

## 2021-11-09 MED FILL — Amlodipine Besylate Tab 10 MG (Base Equivalent): ORAL | 90 days supply | Qty: 90 | Fill #1 | Status: AC

## 2021-11-13 ENCOUNTER — Other Ambulatory Visit: Payer: Self-pay

## 2021-12-21 ENCOUNTER — Other Ambulatory Visit: Payer: Self-pay

## 2022-02-05 ENCOUNTER — Other Ambulatory Visit: Payer: Self-pay

## 2022-02-11 ENCOUNTER — Other Ambulatory Visit: Payer: Self-pay

## 2022-02-25 ENCOUNTER — Other Ambulatory Visit: Payer: Self-pay

## 2022-03-29 ENCOUNTER — Other Ambulatory Visit: Payer: Self-pay

## 2022-03-29 MED FILL — Amlodipine Besylate Tab 10 MG (Base Equivalent): ORAL | 90 days supply | Qty: 90 | Fill #2 | Status: AC

## 2022-05-06 ENCOUNTER — Other Ambulatory Visit: Payer: Self-pay

## 2022-05-14 ENCOUNTER — Other Ambulatory Visit: Payer: Self-pay

## 2022-06-01 ENCOUNTER — Other Ambulatory Visit (HOSPITAL_COMMUNITY): Payer: Self-pay

## 2022-06-03 ENCOUNTER — Other Ambulatory Visit (HOSPITAL_COMMUNITY): Payer: Self-pay

## 2022-07-07 ENCOUNTER — Ambulatory Visit (INDEPENDENT_AMBULATORY_CARE_PROVIDER_SITE_OTHER): Payer: No Typology Code available for payment source | Admitting: Family Medicine

## 2022-07-07 VITALS — BP 150/88 | HR 88 | Temp 97.1°F | Resp 12 | Ht 63.5 in | Wt 156.4 lb

## 2022-07-07 DIAGNOSIS — I10 Essential (primary) hypertension: Secondary | ICD-10-CM | POA: Diagnosis not present

## 2022-07-07 LAB — COMPREHENSIVE METABOLIC PANEL
ALT: 19 U/L (ref 0–35)
AST: 16 U/L (ref 0–37)
Albumin: 3.8 g/dL (ref 3.5–5.2)
Alkaline Phosphatase: 31 U/L — ABNORMAL LOW (ref 39–117)
BUN: 12 mg/dL (ref 6–23)
CO2: 25 mEq/L (ref 19–32)
Calcium: 10.1 mg/dL (ref 8.4–10.5)
Chloride: 104 mEq/L (ref 96–112)
Creatinine, Ser: 0.87 mg/dL (ref 0.40–1.20)
GFR: 80.2 mL/min (ref >60.00)
Glucose, Bld: 105 mg/dL — ABNORMAL HIGH (ref 70–99)
Potassium: 4 mEq/L (ref 3.5–5.1)
Sodium: 136 mEq/L (ref 135–145)
Total Bilirubin: 0.5 mg/dL (ref 0.2–1.2)
Total Protein: 7.3 g/dL (ref 6.0–8.3)

## 2022-07-07 LAB — CBC WITH DIFFERENTIAL/PLATELET
Basophils Absolute: 0.1 10*3/uL (ref 0.0–0.1)
Basophils Relative: 1.4 % (ref 0.0–3.0)
Eosinophils Absolute: 0.4 10*3/uL (ref 0.0–0.7)
Eosinophils Relative: 6.2 % — ABNORMAL HIGH (ref 0.0–5.0)
HCT: 34.2 % — ABNORMAL LOW (ref 36.0–46.0)
Hemoglobin: 11.1 g/dL — ABNORMAL LOW (ref 12.0–15.0)
Lymphocytes Relative: 36.3 % (ref 12.0–46.0)
Lymphs Abs: 2.3 10*3/uL (ref 0.7–4.0)
MCHC: 32.3 g/dL (ref 30.0–36.0)
MCV: 93.4 fl (ref 78.0–100.0)
Monocytes Absolute: 0.3 10*3/uL (ref 0.1–1.0)
Monocytes Relative: 4.8 % (ref 3.0–12.0)
Neutro Abs: 3.3 10*3/uL (ref 1.4–7.7)
Neutrophils Relative %: 51.3 % (ref 43.0–77.0)
Platelets: 292 10*3/uL (ref 150.0–400.0)
RBC: 3.66 Mil/uL — ABNORMAL LOW (ref 3.87–5.11)
RDW: 13 % (ref 11.5–15.5)
WBC: 6.4 10*3/uL (ref 4.0–10.5)

## 2022-07-07 LAB — TSH: TSH: 1.11 u[IU]/mL (ref 0.35–5.50)

## 2022-07-07 NOTE — Progress Notes (Signed)
Patient ID: Jodi Ross, female    DOB: [birth year], 46 y.o.   MRN: [id number]  This visit was conducted in person.  BP (!) 150/88   Pulse 88   Temp (!) 97.1 F (36.2 C)   Resp 12   Ht 5' 3.5" (1.613 m)   Wt 156 lb 6 oz (70.9 kg)   SpO2 99%   BMI 27.27 kg/m    CC:  Chief Complaint  Patient presents with   Hypertension    B/p staying elevated, 160s/90s. Asymptomatic    Subjective:   HPI: Jodi Ross is a 46 y.o. female a previous patient of Rene Kocher Beaty's with no new PCP and history of high blood pressure presenting on 07/07/2022 for Hypertension (B/p staying elevated, 160s/90s. Asymptomatic)  He has not been seen in about 1 year Hypertension:  He has noted worsened BP in last year... at home noting 160/90s. On amlodipine 10 mg p.o. daily, no SE.  She has been exercising more, weight loss BP Readings from Last 3 Encounters:  07/07/22 (!) 150/88  08/18/21 (!) 146/92  05/13/21 120/78  Using medication without problems or lightheadedness:  none  No headache vision change. Chest pain with exertion: none Edema: none Short of breath: none Average home BPs: Other issues:  Wt Readings from Last 3 Encounters:  07/07/22 156 lb 6 oz (70.9 kg)  08/18/21 159 lb (72.1 kg)  05/13/21 159 lb (72.1 kg)    SE to metoprolol in past.   Reviewed labs from last year.. no DM   Strong family history of hypertension     Relevant past medical, surgical, family and social history reviewed and updated as indicated. Interim medical history since our last visit reviewed. Allergies and medications reviewed and updated. Outpatient Medications Prior to Visit  Medication Sig Dispense Refill   amLODipine (NORVASC) 10 MG tablet TAKE 1 TABLET (10 MG TOTAL) BY MOUTH DAILY. 90 tablet 3   etonogestrel-ethinyl estradiol (NUVARING) 0.12-0.015 MG/24HR vaginal ring INSERT 1 RING VAGINALLY AND LEAVE IN PLACE FOR 3 CONSECUTIVE WEEKS, THEN REMOVE FOR 1 WEEK. 3 each 3   fluticasone (FLONASE) 50  MCG/ACT nasal spray Place 2 sprays into both nostrils daily. (Patient taking differently: Place 2 sprays into both nostrils as needed.) 16 g 0   ibuprofen (ADVIL) 200 MG tablet Take 200 mg by mouth every 6 (six) hours as needed.     levocetirizine (XYZAL) 5 MG tablet Take 1 tablet (5 mg total) by mouth every evening. 30 tablet 11   pantoprazole (PROTONIX) 40 MG tablet Take 1 tablet (40 mg total) by mouth daily. 30 tablet 3   loratadine-pseudoephedrine (SM LORATA-DINE D) 10-240 MG 24 hr tablet Take 1 tablet by mouth daily. 30 tablet 0   No facility-administered medications prior to visit.     Per HPI unless specifically indicated in ROS section below Review of Systems  Constitutional:  Negative for fatigue and fever.  HENT:  Negative for congestion.   Eyes:  Negative for pain.  Respiratory:  Negative for cough and shortness of breath.   Cardiovascular:  Negative for chest pain, palpitations and leg swelling.  Gastrointestinal:  Negative for abdominal pain.  Genitourinary:  Negative for dysuria and vaginal bleeding.  Musculoskeletal:  Negative for back pain.  Neurological:  Negative for syncope, light-headedness and headaches.  Psychiatric/Behavioral:  Negative for dysphoric mood.    Objective:  BP (!) 150/88   Pulse 88   Temp (!) 97.1 F (36.2 C)   Resp 12  Ht 5' 3.5" (1.613 m)   Wt 156 lb 6 oz (70.9 kg)   SpO2 99%   BMI 27.27 kg/m   Wt Readings from Last 3 Encounters:  07/07/22 156 lb 6 oz (70.9 kg)  08/18/21 159 lb (72.1 kg)  05/13/21 159 lb (72.1 kg)      Physical Exam Constitutional:      General: She is not in acute distress.    Appearance: Normal appearance. She is well-developed. She is not ill-appearing or toxic-appearing.  HENT:     Head: Normocephalic.     Right Ear: Hearing, tympanic membrane, ear canal and external ear normal. Tympanic membrane is not erythematous, retracted or bulging.     Left Ear: Hearing, tympanic membrane, ear canal and external ear  normal. Tympanic membrane is not erythematous, retracted or bulging.     Nose: No mucosal edema or rhinorrhea.     Right Sinus: No maxillary sinus tenderness or frontal sinus tenderness.     Left Sinus: No maxillary sinus tenderness or frontal sinus tenderness.     Mouth/Throat:     Pharynx: Uvula midline.  Eyes:     General: Lids are normal. Lids are everted, no foreign bodies appreciated.     Conjunctiva/sclera: Conjunctivae normal.     Pupils: Pupils are equal, round, and reactive to light.  Neck:     Thyroid: No thyroid mass or thyromegaly.     Vascular: No carotid bruit.     Trachea: Trachea normal.  Cardiovascular:     Rate and Rhythm: Normal rate and regular rhythm.     Pulses: Normal pulses.     Heart sounds: Normal heart sounds, S1 normal and S2 normal. No murmur heard.    No friction rub. No gallop.  Pulmonary:     Effort: Pulmonary effort is normal. No tachypnea or respiratory distress.     Breath sounds: Normal breath sounds. No decreased breath sounds, wheezing, rhonchi or rales.  Abdominal:     General: Bowel sounds are normal.     Palpations: Abdomen is soft.     Tenderness: There is no abdominal tenderness.  Musculoskeletal:     Cervical back: Normal range of motion and neck supple.  Skin:    General: Skin is warm and dry.     Findings: No rash.  Neurological:     Mental Status: She is alert.  Psychiatric:        Mood and Affect: Mood is not anxious or depressed.        Speech: Speech normal.        Behavior: Behavior normal. Behavior is cooperative.        Thought Content: Thought content normal.        Judgment: Judgment normal.       Results for orders placed or performed in visit on 08/21/21  Fecal occult blood, imunochemical   Specimen: Stool  Result Value Ref Range   Fecal Occult Bld Negative Negative     COVID 19 screen:  No recent travel or known exposure to COVID19 The patient denies respiratory symptoms of COVID 19 at this time. The  importance of social distancing was discussed today.   Assessment and Plan Problem List Items Addressed This Visit     Essential hypertension - Primary    Chronic, recent worsening control  No clear obvious causes such as weight gain, increased salt or increased stress.  She does work night shift.  We will evaluate for secondary causes with CBC, thyroid and  complete metabolic panel.  If there is no clear abnormality we will plan on adding losartan hydrochlorothiazide to her amlodipine 10 mg p.o. daily.      Relevant Orders   Comprehensive metabolic panel   CBC with Differential/Platelet   TSH       Kerby Nora, MD

## 2022-07-07 NOTE — Patient Instructions (Signed)
Please stop at the lab to have labs drawn.  

## 2022-07-07 NOTE — Assessment & Plan Note (Signed)
Chronic, recent worsening control  No clear obvious causes such as weight gain, increased salt or increased stress.  She does work night shift.  We will evaluate for secondary causes with CBC, thyroid and complete metabolic panel.  If there is no clear abnormality we will plan on adding losartan hydrochlorothiazide to her amlodipine 10 mg p.o. daily.

## 2022-07-08 ENCOUNTER — Other Ambulatory Visit: Payer: Self-pay

## 2022-07-08 ENCOUNTER — Other Ambulatory Visit: Payer: Self-pay | Admitting: Family Medicine

## 2022-07-08 MED ORDER — LOSARTAN POTASSIUM-HCTZ 50-12.5 MG PO TABS
1.0000 | ORAL_TABLET | Freq: Every day | ORAL | 11 refills | Status: DC
  Filled 2022-07-08: qty 30, 30d supply, fill #0
  Filled 2022-08-04: qty 30, 30d supply, fill #1
  Filled 2022-09-13: qty 30, 30d supply, fill #2
  Filled 2022-10-10 – 2022-10-13 (×2): qty 30, 30d supply, fill #3
  Filled 2022-11-09: qty 30, 30d supply, fill #4
  Filled 2022-12-09: qty 30, 30d supply, fill #5
  Filled 2023-01-18: qty 30, 30d supply, fill #6
  Filled 2023-03-01: qty 30, 30d supply, fill #7
  Filled 2023-04-05: qty 30, 30d supply, fill #8
  Filled 2023-05-01: qty 30, 30d supply, fill #9
  Filled 2023-05-31: qty 30, 30d supply, fill #10
  Filled 2023-06-30: qty 30, 30d supply, fill #11

## 2022-07-19 ENCOUNTER — Other Ambulatory Visit: Payer: Self-pay

## 2022-07-22 ENCOUNTER — Other Ambulatory Visit: Payer: Self-pay

## 2022-07-22 ENCOUNTER — Telehealth: Payer: Self-pay | Admitting: Family Medicine

## 2022-07-22 ENCOUNTER — Ambulatory Visit (INDEPENDENT_AMBULATORY_CARE_PROVIDER_SITE_OTHER): Payer: No Typology Code available for payment source | Admitting: Family Medicine

## 2022-07-22 ENCOUNTER — Encounter: Payer: Self-pay | Admitting: Family Medicine

## 2022-07-22 VITALS — BP 132/86 | HR 74 | Temp 98.0°F | Ht 63.5 in | Wt 149.0 lb

## 2022-07-22 DIAGNOSIS — I10 Essential (primary) hypertension: Secondary | ICD-10-CM

## 2022-07-22 DIAGNOSIS — Z0000 Encounter for general adult medical examination without abnormal findings: Secondary | ICD-10-CM | POA: Diagnosis not present

## 2022-07-22 DIAGNOSIS — Z1211 Encounter for screening for malignant neoplasm of colon: Secondary | ICD-10-CM

## 2022-07-22 DIAGNOSIS — R61 Generalized hyperhidrosis: Secondary | ICD-10-CM

## 2022-07-22 DIAGNOSIS — Z13220 Encounter for screening for lipoid disorders: Secondary | ICD-10-CM

## 2022-07-22 DIAGNOSIS — K219 Gastro-esophageal reflux disease without esophagitis: Secondary | ICD-10-CM | POA: Diagnosis not present

## 2022-07-22 DIAGNOSIS — J301 Allergic rhinitis due to pollen: Secondary | ICD-10-CM | POA: Diagnosis not present

## 2022-07-22 DIAGNOSIS — D75A Glucose-6-phosphate dehydrogenase (G6PD) deficiency without anemia: Secondary | ICD-10-CM

## 2022-07-22 DIAGNOSIS — R768 Other specified abnormal immunological findings in serum: Secondary | ICD-10-CM

## 2022-07-22 MED ORDER — ONDANSETRON HCL 4 MG PO TABS
4.0000 mg | ORAL_TABLET | Freq: Three times a day (TID) | ORAL | 0 refills | Status: DC | PRN
  Filled 2022-07-22: qty 20, 7d supply, fill #0

## 2022-07-22 NOTE — Assessment & Plan Note (Signed)
Seen by Rheum.  Family history in son of lupus.  no joint issues.

## 2022-07-22 NOTE — Assessment & Plan Note (Signed)
Unclear cause... work up negative in past. Occurs during menses.

## 2022-07-22 NOTE — Telephone Encounter (Signed)
-----   Message from Ellamae Sia sent at 07/22/2022 12:27 PM EDT ----- Regarding: lab orders for Friday, 10.6.23 Lab order, thanks

## 2022-07-22 NOTE — Progress Notes (Signed)
Patient ID: Jodi Ross, female    DOB: [birth year], 46 y.o.   MRN: [id number]  This visit was conducted in person.  BP 132/86   Pulse 74   Temp 98 F (36.7 C) (Temporal)   Ht 5' 3.5" (1.613 m)   Wt 149 lb (67.6 kg)   SpO2 99%   BMI 25.98 kg/m    CC:  Chief Complaint  Patient presents with   Transitions Of Care    BP follow up     Subjective:   HPI: Jodi Ross is a 46 y.o. female presenting on 07/22/2022 for Transitions Of Care (BP follow up )   Hypertension:  At last office visit 2 weeks ago I added losartan hydrochlorothiazide  50/12.5 mg daily to her amlodipine 10 mg p.o. daily.  Using medication without problems or lightheadedness:  occ.. improved with taking at different times. Chest pain with exertion: none Edema:none Short of breath:none Average home BPs: 106/70 Other issues:   BP Readings from Last 3 Encounters:  07/22/22 132/86  07/07/22 (!) 150/88  08/18/21 (!) 146/92    Allergies rhinitis, seasonal: Using Xyzal , flonase 2 spray per nostril daily and benadryl Prn.  She has nausea when having post nasal drip.  Hx of recurrent  boil but none since 2022. No wound culture done in past.     GERD, triggered by tomato and coffee.Marland Kitchen avoids these. Using protonix as needed.   Relevant past medical, surgical, family and social history reviewed and updated as indicated. Interim medical history since our last visit reviewed. Allergies and medications reviewed and updated. Outpatient Medications Prior to Visit  Medication Sig Dispense Refill   etonogestrel-ethinyl estradiol (NUVARING) 0.12-0.015 MG/24HR vaginal ring INSERT 1 RING VAGINALLY AND LEAVE IN PLACE FOR 3 CONSECUTIVE WEEKS, THEN REMOVE FOR 1 WEEK. 3 each 3   fluticasone (FLONASE) 50 MCG/ACT nasal spray Place 2 sprays into both nostrils daily. (Patient taking differently: Place 2 sprays into both nostrils as needed.) 16 g 0   ibuprofen (ADVIL) 200 MG tablet Take 200 mg by mouth every 6 (six) hours  as needed.     levocetirizine (XYZAL) 5 MG tablet Take 1 tablet (5 mg total) by mouth every evening. 30 tablet 11   losartan-hydrochlorothiazide (HYZAAR) 50-12.5 MG tablet Take 1 tablet by mouth daily. 30 tablet 11   pantoprazole (PROTONIX) 40 MG tablet Take 1 tablet (40 mg total) by mouth daily. 30 tablet 3   amLODipine (NORVASC) 10 MG tablet TAKE 1 TABLET (10 MG TOTAL) BY MOUTH DAILY. 90 tablet 3   No facility-administered medications prior to visit.     Per HPI unless specifically indicated in ROS section below Review of Systems  Constitutional:  Negative for fatigue and fever.  HENT:  Negative for congestion.   Eyes:  Negative for pain.  Respiratory:  Negative for cough and shortness of breath.   Cardiovascular:  Negative for chest pain, palpitations and leg swelling.  Gastrointestinal:  Negative for abdominal pain.  Genitourinary:  Negative for dysuria and vaginal bleeding.  Musculoskeletal:  Negative for back pain.  Neurological:  Negative for syncope, light-headedness and headaches.  Psychiatric/Behavioral:  Negative for dysphoric mood.    Objective:  BP 132/86   Pulse 74   Temp 98 F (36.7 C) (Temporal)   Ht 5' 3.5" (1.613 m)   Wt 149 lb (67.6 kg)   SpO2 99%   BMI 25.98 kg/m   Wt Readings from Last 3 Encounters:  07/22/22 149 lb (67.6  kg)  07/07/22 156 lb 6 oz (70.9 kg)  08/18/21 159 lb (72.1 kg)      Physical Exam Vitals and nursing note reviewed.  Constitutional:      General: She is not in acute distress.    Appearance: Normal appearance. She is well-developed. She is not ill-appearing or toxic-appearing.  HENT:     Head: Normocephalic.     Right Ear: Hearing, tympanic membrane, ear canal and external ear normal.     Left Ear: Hearing, tympanic membrane, ear canal and external ear normal.     Nose: Nose normal.  Eyes:     General: Lids are normal. Lids are everted, no foreign bodies appreciated.     Conjunctiva/sclera: Conjunctivae normal.     Pupils:  Pupils are equal, round, and reactive to light.  Neck:     Thyroid: No thyroid mass or thyromegaly.     Vascular: No carotid bruit.     Trachea: Trachea normal.  Cardiovascular:     Rate and Rhythm: Normal rate and regular rhythm.     Heart sounds: Normal heart sounds, S1 normal and S2 normal. No murmur heard.    No gallop.  Pulmonary:     Effort: Pulmonary effort is normal. No respiratory distress.     Breath sounds: Normal breath sounds. No wheezing, rhonchi or rales.  Abdominal:     General: Bowel sounds are normal. There is no distension or abdominal bruit.     Palpations: Abdomen is soft. There is no fluid wave or mass.     Tenderness: There is no abdominal tenderness. There is no guarding or rebound.     Hernia: No hernia is present.  Musculoskeletal:     Cervical back: Normal range of motion and neck supple.  Lymphadenopathy:     Cervical: No cervical adenopathy.  Skin:    General: Skin is warm and dry.     Findings: No rash.  Neurological:     Mental Status: She is alert.     Cranial Nerves: No cranial nerve deficit.     Sensory: No sensory deficit.  Psychiatric:        Mood and Affect: Mood is not anxious or depressed.        Speech: Speech normal.        Behavior: Behavior normal. Behavior is cooperative.        Judgment: Judgment normal.       Results for orders placed or performed in visit on 07/07/22  Comprehensive metabolic panel  Result Value Ref Range   Sodium 136 135 - 145 mEq/L   Potassium 4.0 3.5 - 5.1 mEq/L   Chloride 104 96 - 112 mEq/L   CO2 25 19 - 32 mEq/L   Glucose, Bld 105 (H) 70 - 99 mg/dL   BUN 12 6 - 23 mg/dL   Creatinine, Ser 8.46 0.40 - 1.20 mg/dL   Total Bilirubin 0.5 0.2 - 1.2 mg/dL   Alkaline Phosphatase 31 (L) 39 - 117 U/L   AST 16 0 - 37 U/L   ALT 19 0 - 35 U/L   Total Protein 7.3 6.0 - 8.3 g/dL   Albumin 3.8 3.5 - 5.2 g/dL   GFR 96.29 >52.84 mL/min   Calcium 10.1 8.4 - 10.5 mg/dL  CBC with Differential/Platelet  Result Value  Ref Range   WBC 6.4 4.0 - 10.5 K/uL   RBC 3.66 (L) 3.87 - 5.11 Mil/uL   Hemoglobin 11.1 (L) 12.0 - 15.0 g/dL   HCT  34.2 (L) 36.0 - 46.0 %   MCV 93.4 78.0 - 100.0 fl   MCHC 32.3 30.0 - 36.0 g/dL   RDW 84.6 96.2 - 95.2 %   Platelets 292.0 150.0 - 400.0 K/uL   Neutrophils Relative % 51.3 43.0 - 77.0 %   Lymphocytes Relative 36.3 12.0 - 46.0 %   Monocytes Relative 4.8 3.0 - 12.0 %   Eosinophils Relative 6.2 (H) 0.0 - 5.0 %   Basophils Relative 1.4 0.0 - 3.0 %   Neutro Abs 3.3 1.4 - 7.7 K/uL   Lymphs Abs 2.3 0.7 - 4.0 K/uL   Monocytes Absolute 0.3 0.1 - 1.0 K/uL   Eosinophils Absolute 0.4 0.0 - 0.7 K/uL   Basophils Absolute 0.1 0.0 - 0.1 K/uL  TSH  Result Value Ref Range   TSH 1.11 0.35 - 5.50 uIU/mL     COVID 19 screen:  No recent travel or known exposure to COVID19 The patient denies respiratory symptoms of COVID 19 at this time. The importance of social distancing was discussed today.   Assessment and Plan The patient's preventative maintenance and recommended screening tests for an annual wellness exam were reviewed in full today. Brought up to date unless services declined.  Counselled on the importance of diet, exercise, and its role in overall health and mortality. The patient's FH and SH was reviewed, including their home life, tobacco status, and drug and alcohol status.     Problem List Items Addressed This Visit     Allergic rhinitis    Using Xyzal , flanse 2 spray per nostril daily and benadryl prn.  Sent in prescription for Zofran 4 mg as needed to use for nausea associated with postnasal drip from allergies.      Essential hypertension - Primary    Stable, chronic.   Significant improvement in control with addition of losartan 50/12.5 mg daily.  Continue amlodipine 10 mg p.o. daily.  Encouraged exercise, weight loss, healthy eating habits.  Will return in next few days for recheck of creatinin and potassium on new start ARB.      G6PD deficiency     Hg  11.1 recently.      GERD (gastroesophageal reflux disease)    Avoiding triggers and using prn.      Relevant Medications   ondansetron (ZOFRAN) 4 MG tablet   Night sweats     Unclear cause... work up negative in past. Occurs during menses.       Positive ANA (antinuclear antibody)     Seen by Rheum.  Family history in son of lupus.  no joint issues.      Other Visit Diagnoses     Colon cancer screening       Relevant Orders   Cologuard        Kerby Nora, MD

## 2022-07-22 NOTE — Assessment & Plan Note (Addendum)
Stable, chronic.   Significant improvement in control with addition of losartan 50/12.5 mg daily.  Continue amlodipine 10 mg p.o. daily.  Encouraged exercise, weight loss, healthy eating habits.  Will return in next few days for recheck of creatinin and potassium on new start ARB.

## 2022-07-22 NOTE — Assessment & Plan Note (Addendum)
Using Xyzal , flanse 2 spray per nostril daily and benadryl prn.  Sent in prescription for Zofran 4 mg as needed to use for nausea associated with postnasal drip from allergies.

## 2022-07-22 NOTE — Assessment & Plan Note (Signed)
Hg 11.1 recently.

## 2022-07-22 NOTE — Assessment & Plan Note (Signed)
Avoiding triggers and using prn.

## 2022-07-23 ENCOUNTER — Other Ambulatory Visit (INDEPENDENT_AMBULATORY_CARE_PROVIDER_SITE_OTHER): Payer: No Typology Code available for payment source

## 2022-07-23 DIAGNOSIS — Z13220 Encounter for screening for lipoid disorders: Secondary | ICD-10-CM | POA: Diagnosis not present

## 2022-07-23 DIAGNOSIS — I10 Essential (primary) hypertension: Secondary | ICD-10-CM

## 2022-07-23 LAB — BASIC METABOLIC PANEL
BUN: 17 mg/dL (ref 6–23)
CO2: 28 mEq/L (ref 19–32)
Calcium: 10 mg/dL (ref 8.4–10.5)
Chloride: 103 mEq/L (ref 96–112)
Creatinine, Ser: 1 mg/dL (ref 0.40–1.20)
GFR: 67.84 mL/min (ref >60.00)
Glucose, Bld: 95 mg/dL (ref 70–99)
Potassium: 3.6 mEq/L (ref 3.5–5.1)
Sodium: 136 mEq/L (ref 135–145)

## 2022-07-23 LAB — LIPID PANEL
Cholesterol: 195 mg/dL (ref 0–200)
HDL: 91.1 mg/dL (ref >39.00)
LDL Cholesterol: 89 mg/dL (ref 0–99)
NonHDL: 103.53
Total CHOL/HDL Ratio: 2
Triglycerides: 74 mg/dL (ref 0.0–149.0)
VLDL: 14.8 mg/dL (ref 0.0–40.0)

## 2022-07-23 NOTE — Progress Notes (Signed)
No critical labs need to be addressed urgently. We will discuss labs in detail at upcoming office visit.   

## 2022-08-04 ENCOUNTER — Other Ambulatory Visit: Payer: Self-pay | Admitting: Internal Medicine

## 2022-08-04 ENCOUNTER — Other Ambulatory Visit: Payer: Self-pay | Admitting: Family

## 2022-08-04 ENCOUNTER — Other Ambulatory Visit: Payer: Self-pay

## 2022-08-04 NOTE — Telephone Encounter (Signed)
Please refill as requested.

## 2022-08-04 NOTE — Telephone Encounter (Signed)
Sent to wrong office - pt goes to [facility]

## 2022-08-05 ENCOUNTER — Other Ambulatory Visit: Payer: Self-pay

## 2022-08-05 MED ORDER — AMLODIPINE BESYLATE 10 MG PO TABS
ORAL_TABLET | Freq: Every day | ORAL | 3 refills | Status: DC
  Filled 2022-08-05: qty 90, 90d supply, fill #0
  Filled 2022-12-09: qty 90, 90d supply, fill #1
  Filled 2023-03-26: qty 90, 90d supply, fill #2
  Filled 2023-06-21: qty 90, 90d supply, fill #3

## 2022-08-05 NOTE — Addendum Note (Signed)
Addended by: Carter Kitten on: 08/05/2022 09:06 AM   Modules accepted: Orders

## 2022-08-08 ENCOUNTER — Other Ambulatory Visit: Payer: Self-pay

## 2022-08-08 ENCOUNTER — Other Ambulatory Visit: Payer: Self-pay | Admitting: Family Medicine

## 2022-08-09 ENCOUNTER — Other Ambulatory Visit: Payer: Self-pay

## 2022-08-09 MED FILL — Etonogestrel-Ethinyl Estradiol VA Ring 0.12-0.015 MG/24HR: VAGINAL | 84 days supply | Qty: 3 | Fill #0 | Status: AC

## 2022-08-10 ENCOUNTER — Other Ambulatory Visit: Payer: Self-pay

## 2022-08-11 ENCOUNTER — Other Ambulatory Visit: Payer: Self-pay | Admitting: Family Medicine

## 2022-08-11 DIAGNOSIS — Z1231 Encounter for screening mammogram for malignant neoplasm of breast: Secondary | ICD-10-CM

## 2022-08-13 LAB — COLOGUARD: COLOGUARD: NEGATIVE

## 2022-09-13 ENCOUNTER — Other Ambulatory Visit: Payer: Self-pay

## 2022-09-27 ENCOUNTER — Ambulatory Visit
Admission: RE | Admit: 2022-09-27 | Discharge: 2022-09-27 | Disposition: A | Discharge status: Home / Self Care | Payer: No Typology Code available for payment source | Source: Ambulatory Visit | Attending: Family Medicine | Admitting: Family Medicine

## 2022-09-27 DIAGNOSIS — Z1231 Encounter for screening mammogram for malignant neoplasm of breast: Secondary | ICD-10-CM | POA: Insufficient documentation

## 2022-10-10 ENCOUNTER — Other Ambulatory Visit: Payer: Self-pay | Admitting: Family Medicine

## 2022-10-10 MED ORDER — LEVOCETIRIZINE DIHYDROCHLORIDE 5 MG PO TABS
5.0000 mg | ORAL_TABLET | Freq: Every evening | ORAL | 11 refills | Status: DC
  Filled 2022-10-10 – 2022-10-13 (×2): qty 30, 30d supply, fill #0
  Filled 2022-12-21: qty 30, 30d supply, fill #1

## 2022-10-10 MED FILL — Etonogestrel-Ethinyl Estradiol VA Ring 0.12-0.015 MG/24HR: VAGINAL | 84 days supply | Qty: 3 | Fill #1 | Status: CN

## 2022-10-11 ENCOUNTER — Other Ambulatory Visit: Payer: Self-pay

## 2022-10-13 ENCOUNTER — Other Ambulatory Visit: Payer: Self-pay

## 2022-10-13 MED FILL — Etonogestrel-Ethinyl Estradiol VA Ring 0.12-0.015 MG/24HR: VAGINAL | 84 days supply | Qty: 3 | Fill #1 | Status: CN

## 2022-10-19 ENCOUNTER — Other Ambulatory Visit: Payer: Self-pay

## 2022-10-22 ENCOUNTER — Other Ambulatory Visit: Payer: Self-pay

## 2022-10-22 MED FILL — Etonogestrel-Ethinyl Estradiol VA Ring 0.12-0.015 MG/24HR: VAGINAL | 84 days supply | Qty: 3 | Fill #1 | Status: AC

## 2022-11-10 ENCOUNTER — Other Ambulatory Visit (HOSPITAL_COMMUNITY): Payer: Self-pay

## 2022-12-09 ENCOUNTER — Other Ambulatory Visit (HOSPITAL_COMMUNITY): Payer: Self-pay

## 2022-12-21 ENCOUNTER — Other Ambulatory Visit: Payer: Self-pay

## 2022-12-21 ENCOUNTER — Other Ambulatory Visit: Payer: Self-pay | Admitting: Family Medicine

## 2022-12-21 ENCOUNTER — Other Ambulatory Visit (HOSPITAL_COMMUNITY): Payer: Self-pay

## 2022-12-21 MED ORDER — PANTOPRAZOLE SODIUM 40 MG PO TBEC
40.0000 mg | DELAYED_RELEASE_TABLET | Freq: Every day | ORAL | 3 refills | Status: DC
  Filled 2022-12-21 – 2022-12-24 (×2): qty 30, 30d supply, fill #0

## 2022-12-21 MED ORDER — ONDANSETRON HCL 4 MG PO TABS
4.0000 mg | ORAL_TABLET | Freq: Three times a day (TID) | ORAL | 0 refills | Status: DC | PRN
Start: 2022-12-21 — End: 2023-12-27
  Filled 2022-12-21 – 2022-12-24 (×2): qty 20, 7d supply, fill #0

## 2022-12-21 NOTE — Telephone Encounter (Signed)
Last office visit 07/22/22 for CPE/TOC from [place].  Last refilled 07/22/2022 for #20 with no refills.

## 2022-12-24 ENCOUNTER — Other Ambulatory Visit: Payer: Self-pay

## 2022-12-24 ENCOUNTER — Other Ambulatory Visit (HOSPITAL_COMMUNITY): Payer: Self-pay

## 2023-01-18 ENCOUNTER — Other Ambulatory Visit: Payer: Self-pay

## 2023-01-18 MED FILL — Etonogestrel-Ethinyl Estradiol VA Ring 0.12-0.015 MG/24HR: VAGINAL | 84 days supply | Qty: 3 | Fill #2 | Status: AC

## 2023-03-01 ENCOUNTER — Other Ambulatory Visit: Payer: Self-pay

## 2023-03-26 ENCOUNTER — Other Ambulatory Visit (HOSPITAL_COMMUNITY): Payer: Self-pay

## 2023-04-05 ENCOUNTER — Other Ambulatory Visit (HOSPITAL_COMMUNITY): Payer: Self-pay

## 2023-05-01 MED FILL — Etonogestrel-Ethinyl Estradiol VA Ring 0.12-0.015 MG/24HR: VAGINAL | 84 days supply | Qty: 3 | Fill #3 | Status: AC

## 2023-05-02 ENCOUNTER — Other Ambulatory Visit: Payer: Self-pay

## 2023-05-31 ENCOUNTER — Other Ambulatory Visit (HOSPITAL_COMMUNITY): Payer: Self-pay

## 2023-06-01 ENCOUNTER — Other Ambulatory Visit: Payer: Self-pay

## 2023-06-21 ENCOUNTER — Other Ambulatory Visit (HOSPITAL_COMMUNITY): Payer: Self-pay

## 2023-06-30 ENCOUNTER — Other Ambulatory Visit: Payer: Self-pay

## 2023-07-05 ENCOUNTER — Telehealth: Payer: Self-pay | Admitting: *Deleted

## 2023-07-05 DIAGNOSIS — I10 Essential (primary) hypertension: Secondary | ICD-10-CM

## 2023-07-05 NOTE — Telephone Encounter (Signed)
-----   Message from Alvina Chou sent at 07/05/2023  2:53 PM EDT ----- Regarding: Lab orders for Mon, 10.1.24 Patient is scheduled for CPX labs, please order future labs, Thanks , Camelia Eng

## 2023-07-06 ENCOUNTER — Other Ambulatory Visit: Payer: Self-pay

## 2023-07-06 DIAGNOSIS — M542 Cervicalgia: Secondary | ICD-10-CM | POA: Diagnosis not present

## 2023-07-06 DIAGNOSIS — M5412 Radiculopathy, cervical region: Secondary | ICD-10-CM | POA: Diagnosis not present

## 2023-07-06 MED ORDER — METHYLPREDNISOLONE 4 MG PO TBPK
ORAL_TABLET | ORAL | 0 refills | Status: AC
  Filled 2023-07-06: qty 21, 6d supply, fill #0

## 2023-07-06 MED ORDER — CYCLOBENZAPRINE HCL 5 MG PO TABS
5.0000 mg | ORAL_TABLET | Freq: Three times a day (TID) | ORAL | 0 refills | Status: DC | PRN
  Filled 2023-07-06: qty 15, 5d supply, fill #0

## 2023-07-08 DIAGNOSIS — M542 Cervicalgia: Secondary | ICD-10-CM | POA: Diagnosis not present

## 2023-07-08 DIAGNOSIS — M5412 Radiculopathy, cervical region: Secondary | ICD-10-CM | POA: Diagnosis not present

## 2023-07-14 DIAGNOSIS — M5412 Radiculopathy, cervical region: Secondary | ICD-10-CM | POA: Diagnosis not present

## 2023-07-14 DIAGNOSIS — M542 Cervicalgia: Secondary | ICD-10-CM | POA: Diagnosis not present

## 2023-07-19 ENCOUNTER — Other Ambulatory Visit (INDEPENDENT_AMBULATORY_CARE_PROVIDER_SITE_OTHER): Payer: Commercial Managed Care - PPO

## 2023-07-19 DIAGNOSIS — I10 Essential (primary) hypertension: Secondary | ICD-10-CM

## 2023-07-19 DIAGNOSIS — M542 Cervicalgia: Secondary | ICD-10-CM | POA: Diagnosis not present

## 2023-07-19 DIAGNOSIS — M5412 Radiculopathy, cervical region: Secondary | ICD-10-CM | POA: Diagnosis not present

## 2023-07-19 LAB — COMPREHENSIVE METABOLIC PANEL
ALT: 23 U/L (ref 0–35)
AST: 18 U/L (ref 0–37)
Albumin: 3.8 g/dL (ref 3.5–5.2)
Alkaline Phosphatase: 22 U/L — ABNORMAL LOW (ref 39–117)
BUN: 15 mg/dL (ref 6–23)
CO2: 27 meq/L (ref 19–32)
Calcium: 10.3 mg/dL (ref 8.4–10.5)
Chloride: 103 meq/L (ref 96–112)
Creatinine, Ser: 0.94 mg/dL (ref 0.40–1.20)
GFR: 72.56 mL/min (ref >60.00)
Glucose, Bld: 87 mg/dL (ref 70–99)
Potassium: 3.5 meq/L (ref 3.5–5.1)
Sodium: 137 meq/L (ref 135–145)
Total Bilirubin: 0.7 mg/dL (ref 0.2–1.2)
Total Protein: 7.1 g/dL (ref 6.0–8.3)

## 2023-07-19 LAB — LIPID PANEL
Cholesterol: 235 mg/dL — ABNORMAL HIGH (ref 0–200)
HDL: 105.6 mg/dL (ref >39.00)
LDL Cholesterol: 117 mg/dL — ABNORMAL HIGH (ref 0–99)
NonHDL: 129.84
Total CHOL/HDL Ratio: 2
Triglycerides: 65 mg/dL (ref 0.0–149.0)
VLDL: 13 mg/dL (ref 0.0–40.0)

## 2023-07-19 NOTE — Progress Notes (Signed)
No critical labs need to be addressed urgently. We will discuss labs in detail at upcoming office visit.   

## 2023-07-25 ENCOUNTER — Other Ambulatory Visit: Payer: Self-pay

## 2023-07-25 DIAGNOSIS — M542 Cervicalgia: Secondary | ICD-10-CM | POA: Diagnosis not present

## 2023-07-25 DIAGNOSIS — M5412 Radiculopathy, cervical region: Secondary | ICD-10-CM | POA: Diagnosis not present

## 2023-07-26 ENCOUNTER — Ambulatory Visit (INDEPENDENT_AMBULATORY_CARE_PROVIDER_SITE_OTHER): Payer: Commercial Managed Care - PPO | Admitting: Family Medicine

## 2023-07-26 ENCOUNTER — Other Ambulatory Visit (HOSPITAL_COMMUNITY)
Admission: RE | Admit: 2023-07-26 | Discharge: 2023-07-26 | Disposition: A | Discharge status: Home / Self Care | Payer: Commercial Managed Care - PPO | Source: Ambulatory Visit | Attending: Family Medicine | Admitting: Family Medicine

## 2023-07-26 ENCOUNTER — Other Ambulatory Visit: Payer: Self-pay

## 2023-07-26 VITALS — BP 132/86 | HR 64 | Temp 99.0°F | Ht 63.5 in | Wt 151.0 lb

## 2023-07-26 DIAGNOSIS — Z1231 Encounter for screening mammogram for malignant neoplasm of breast: Secondary | ICD-10-CM

## 2023-07-26 DIAGNOSIS — E7800 Pure hypercholesterolemia, unspecified: Secondary | ICD-10-CM | POA: Diagnosis not present

## 2023-07-26 DIAGNOSIS — Z124 Encounter for screening for malignant neoplasm of cervix: Secondary | ICD-10-CM | POA: Insufficient documentation

## 2023-07-26 DIAGNOSIS — E785 Hyperlipidemia, unspecified: Secondary | ICD-10-CM | POA: Insufficient documentation

## 2023-07-26 DIAGNOSIS — Z0000 Encounter for general adult medical examination without abnormal findings: Secondary | ICD-10-CM

## 2023-07-26 DIAGNOSIS — I10 Essential (primary) hypertension: Secondary | ICD-10-CM

## 2023-07-26 DIAGNOSIS — Z23 Encounter for immunization: Secondary | ICD-10-CM | POA: Diagnosis not present

## 2023-07-26 MED ORDER — ETONOGESTREL-ETHINYL ESTRADIOL 0.12-0.015 MG/24HR VA RING
VAGINAL_RING | VAGINAL | 3 refills | Status: DC
  Filled 2023-07-26: qty 3, 84d supply, fill #0
  Filled 2023-10-16: qty 3, 84d supply, fill #1
  Filled 2024-01-19: qty 3, 84d supply, fill #2
  Filled 2024-05-16: qty 3, 84d supply, fill #3

## 2023-07-26 NOTE — Assessment & Plan Note (Addendum)
Stable, chronic.    Given her concern of the increase in cholesterol from last year to this year... with addition of losartan/hydrochlorothiazide... we discussed no clear association of this med with CAD, even though diuretic may increase LDL slightly.  She will work on diet changes and re-eval in 3 months.    Losartan hydrochlorothiazide 50/12.5 mg daily Amlodipine 10 mg p.o. daily

## 2023-07-26 NOTE — Progress Notes (Signed)
Patient ID: Jodi Ross, female    DOB: Mar 17, 1976, 47 y.o.   MRN: [id number]  This visit was conducted in person.  BP 132/86   Pulse 64   Temp 99 F (37.2 C) (Oral)   Ht 5' 3.5" (1.613 m)   Wt 151 lb (68.5 kg)   LMP 07/08/2023   SpO2 99%   BMI 26.33 kg/m    CC:  Chief Complaint  Patient presents with   Annual Exam    Labs printed     Subjective:   HPI: Jodi Ross is a 47 y.o. female presenting on 07/26/2023 for Annual Exam (Labs printed )   Has been going to PT, eval for upper back pain  [facility].  Xray done, MRIext week.  Reviewed labs in detail with patient. She is eating eggs every morning... new for her.  She is walking daily.  Lab Results  Component Value Date   CHOL 235 (H) 07/19/2023   HDL 105.60 07/19/2023   LDLCALC 117 (H) 07/19/2023   TRIG 65.0 07/19/2023   CHOLHDL 2 07/19/2023    Hypertension:  Well-controlled on losartan hydrochlorothiazide  50/12.5 mg daily and amlodipine 10 mg p.o. daily.  Using medication without problems or lightheadedness:  occ.. improved with taking at different times. Chest pain with exertion: none Edema:none Short of breath:none Average home BPs: 106/70 Other issues: BP Readings from Last 3 Encounters:  07/26/23 132/86  07/22/22 132/86  07/07/22 (!) 150/88    Allergies rhinitis, seasonal: Using Xyzal , flonase 2 spray per nostril daily and benadryl Prn.  She has nausea when having post nasal drip.  Hx of recurrent  boil but none since 2022. No wound culture done in past.     GERD, triggered by tomato and coffee.Marland Kitchen avoids these. Using protonix as needed.   Relevant past medical, surgical, family and social history reviewed and updated as indicated. Interim medical history since our last visit reviewed. Allergies and medications reviewed and updated. Outpatient Medications Prior to Visit  Medication Sig Dispense Refill   amLODipine (NORVASC) 10 MG tablet TAKE 1 TABLET (10 MG TOTAL) BY MOUTH DAILY. 90  tablet 3   cyclobenzaprine (FLEXERIL) 5 MG tablet Take 1 tablet (5 mg total) by mouth 2 (two) to 3 (three) times daily as needed. 15 tablet 0   fluticasone (FLONASE) 50 MCG/ACT nasal spray Place 2 sprays into both nostrils daily. (Patient taking differently: Place 2 sprays into both nostrils as needed.) 16 g 0   ibuprofen (ADVIL) 200 MG tablet Take 200 mg by mouth every 6 (six) hours as needed.     levocetirizine (XYZAL) 5 MG tablet Take 1 tablet (5 mg total) by mouth every evening. 30 tablet 11   losartan-hydrochlorothiazide (HYZAAR) 50-12.5 MG tablet Take 1 tablet by mouth daily. 30 tablet 11   ondansetron (ZOFRAN) 4 MG tablet Take 1 tablet (4 mg total) by mouth every 8 (eight) hours as needed for nausea or vomiting. 20 tablet 0   pantoprazole (PROTONIX) 40 MG tablet Take 1 tablet (40 mg total) by mouth daily. 30 tablet 3   etonogestrel-ethinyl estradiol (NUVARING) 0.12-0.015 MG/24HR vaginal ring INSERT 1 RING VAGINALLY AND LEAVE IN PLACE FOR 3 CONSECUTIVE WEEKS, THEN REMOVE FOR 1 WEEK. 3 each 3   amLODipine (NORVASC) 5 MG tablet      No facility-administered medications prior to visit.     Per HPI unless specifically indicated in ROS section below Review of Systems  Constitutional:  Negative for fatigue and  fever.  HENT:  Negative for congestion.   Eyes:  Negative for pain.  Respiratory:  Negative for cough and shortness of breath.   Cardiovascular:  Negative for chest pain, palpitations and leg swelling.  Gastrointestinal:  Negative for abdominal pain.  Genitourinary:  Negative for dysuria and vaginal bleeding.  Musculoskeletal:  Negative for back pain.  Neurological:  Negative for syncope, light-headedness and headaches.  Psychiatric/Behavioral:  Negative for dysphoric mood.    Objective:  BP 132/86   Pulse 64   Temp 99 F (37.2 C) (Oral)   Ht 5' 3.5" (1.613 m)   Wt 151 lb (68.5 kg)   LMP 07/08/2023   SpO2 99%   BMI 26.33 kg/m   Wt Readings from Last 3 Encounters:   07/26/23 151 lb (68.5 kg)  07/22/22 149 lb (67.6 kg)  07/07/22 156 lb 6 oz (70.9 kg)      Physical Exam Vitals and nursing note reviewed.  Constitutional:      General: She is not in acute distress.    Appearance: Normal appearance. She is well-developed. She is not ill-appearing or toxic-appearing.  HENT:     Head: Normocephalic.     Right Ear: Hearing, tympanic membrane, ear canal and external ear normal.     Left Ear: Hearing, tympanic membrane, ear canal and external ear normal.     Nose: Nose normal.  Eyes:     General: Lids are normal. Lids are everted, no foreign bodies appreciated.     Conjunctiva/sclera: Conjunctivae normal.     Pupils: Pupils are equal, round, and reactive to light.  Neck:     Thyroid: No thyroid mass or thyromegaly.     Vascular: No carotid bruit.     Trachea: Trachea normal.  Cardiovascular:     Rate and Rhythm: Normal rate and regular rhythm.     Heart sounds: Normal heart sounds, S1 normal and S2 normal. No murmur heard.    No gallop.  Pulmonary:     Effort: Pulmonary effort is normal. No respiratory distress.     Breath sounds: Normal breath sounds. No wheezing, rhonchi or rales.  Abdominal:     General: Bowel sounds are normal. There is no distension or abdominal bruit.     Palpations: Abdomen is soft. There is no fluid wave or mass.     Tenderness: There is no abdominal tenderness. There is no guarding or rebound.     Hernia: No hernia is present.  Musculoskeletal:     Cervical back: Normal range of motion and neck supple.  Lymphadenopathy:     Cervical: No cervical adenopathy.  Skin:    General: Skin is warm and dry.     Findings: No rash.  Neurological:     Mental Status: She is alert.     Cranial Nerves: No cranial nerve deficit.     Sensory: No sensory deficit.  Psychiatric:        Mood and Affect: Mood is not anxious or depressed.        Speech: Speech normal.        Behavior: Behavior normal. Behavior is cooperative.         Judgment: Judgment normal.       Results for orders placed or performed in visit on 07/19/23  Lipid panel  Result Value Ref Range   Cholesterol 235 (H) 0 - 200 mg/dL   Triglycerides 04.5 0.0 - 149.0 mg/dL   HDL 409.81 >19.14 mg/dL   VLDL 78.2 0.0 - 40.0  mg/dL   LDL Cholesterol 130 (H) 0 - 99 mg/dL   Total CHOL/HDL Ratio 2    NonHDL 129.84   Comprehensive metabolic panel  Result Value Ref Range   Sodium 137 135 - 145 mEq/L   Potassium 3.5 3.5 - 5.1 mEq/L   Chloride 103 96 - 112 mEq/L   CO2 27 19 - 32 mEq/L   Glucose, Bld 87 70 - 99 mg/dL   BUN 15 6 - 23 mg/dL   Creatinine, Ser 8.65 0.40 - 1.20 mg/dL   Total Bilirubin 0.7 0.2 - 1.2 mg/dL   Alkaline Phosphatase 22 (L) 39 - 117 U/L   AST 18 0 - 37 U/L   ALT 23 0 - 35 U/L   Total Protein 7.1 6.0 - 8.3 g/dL   Albumin 3.8 3.5 - 5.2 g/dL   GFR 78.46 >96.29 mL/min   Calcium 10.3 8.4 - 10.5 mg/dL     COVID 19 screen:  No recent travel or known exposure to COVID19 The patient denies respiratory symptoms of COVID 19 at this time. The importance of social distancing was discussed today.   Assessment and Plan The patient's preventative maintenance and recommended screening tests for an annual wellness exam were reviewed in full today. Brought up to date unless services declined.  Counselled on the importance of diet, exercise, and its role in overall health and mortality. The patient's FH and SH was reviewed, including their home life, tobacco status, and drug and alcohol status.   Vaccines: Flu vaccine given today, uptodate with tetanus Pap/DVE:   Due,  doing well on Nuvaring Mammo:  09/2022 Colon: iFOB  neg in 2022, Cologuard October 2023 negative, repeat in 2026  Smoking Status:none ETOH/ drug use:  once a week/none  Hep C:  done  HIV screen:  none     Problem List Items Addressed This Visit     Elevated LDL cholesterol level     Given her concern of the increase in cholesterol from last year to this year... with  addition of losartan/hydrochlorothiazide... we discussed no clear association of this med with CAD, even though diuretic may increase LDL slightly.  She will work on diet changes and re-eval in 3 months.      Essential hypertension    Stable, chronic.    Given her concern of the increase in cholesterol from last year to this year... with addition of losartan/hydrochlorothiazide... we discussed no clear association of this med with CAD, even though diuretic may increase LDL slightly.  She will work on diet changes and re-eval in 3 months.    Losartan hydrochlorothiazide 50/12.5 mg daily Amlodipine 10 mg p.o. daily      Other Visit Diagnoses     Routine general medical examination at a health care facility    -  Primary   Screening mammogram, encounter for       Relevant Orders   MM DIGITAL SCREENING BILATERAL   Screening for cervical cancer       Relevant Orders   Cytology - PAP         Kerby Nora, MD

## 2023-07-26 NOTE — Assessment & Plan Note (Signed)
Given her concern of the increase in cholesterol from last year to this year... with addition of losartan/hydrochlorothiazide... we discussed no clear association of this med with CAD, even though diuretic may increase LDL slightly.  She will work on diet changes and re-eval in 3 months.

## 2023-07-28 LAB — CYTOLOGY - PAP
Adequacy: ABSENT
Comment: NEGATIVE
Diagnosis: NEGATIVE
High risk HPV: NEGATIVE

## 2023-08-03 DIAGNOSIS — M542 Cervicalgia: Secondary | ICD-10-CM | POA: Diagnosis not present

## 2023-08-12 ENCOUNTER — Other Ambulatory Visit: Payer: Self-pay

## 2023-08-12 DIAGNOSIS — M5412 Radiculopathy, cervical region: Secondary | ICD-10-CM | POA: Diagnosis not present

## 2023-08-12 MED ORDER — PREGABALIN 50 MG PO CAPS
50.0000 mg | ORAL_CAPSULE | Freq: Two times a day (BID) | ORAL | 0 refills | Status: DC
  Filled 2023-08-12: qty 60, 30d supply, fill #0

## 2023-08-12 MED ORDER — TIZANIDINE HCL 4 MG PO TABS
4.0000 mg | ORAL_TABLET | Freq: Four times a day (QID) | ORAL | 0 refills | Status: DC | PRN
  Filled 2023-08-12: qty 120, 30d supply, fill #0

## 2023-08-23 ENCOUNTER — Encounter: Payer: Self-pay | Admitting: Family Medicine

## 2023-08-24 ENCOUNTER — Other Ambulatory Visit: Payer: Self-pay

## 2023-08-24 MED ORDER — LOSARTAN POTASSIUM 50 MG PO TABS
50.0000 mg | ORAL_TABLET | Freq: Every day | ORAL | 3 refills | Status: DC
  Filled 2023-08-24: qty 90, 90d supply, fill #0
  Filled 2023-12-12: qty 90, 90d supply, fill #1
  Filled 2024-03-07: qty 90, 90d supply, fill #2
  Filled 2024-07-08: qty 90, 90d supply, fill #3

## 2023-08-24 MED ORDER — HYDROCHLOROTHIAZIDE 12.5 MG PO TABS
12.5000 mg | ORAL_TABLET | Freq: Every day | ORAL | 3 refills | Status: DC
  Filled 2023-08-24: qty 90, 90d supply, fill #0

## 2023-09-19 ENCOUNTER — Other Ambulatory Visit: Payer: Self-pay | Admitting: Family Medicine

## 2023-09-19 DIAGNOSIS — Z1231 Encounter for screening mammogram for malignant neoplasm of breast: Secondary | ICD-10-CM

## 2023-09-23 DIAGNOSIS — M5412 Radiculopathy, cervical region: Secondary | ICD-10-CM | POA: Diagnosis not present

## 2023-10-06 ENCOUNTER — Ambulatory Visit
Admission: RE | Admit: 2023-10-06 | Discharge: 2023-10-06 | Disposition: A | Discharge status: Home / Self Care | Payer: Commercial Managed Care - PPO | Source: Ambulatory Visit | Attending: Family Medicine | Admitting: Family Medicine

## 2023-10-06 DIAGNOSIS — Z1231 Encounter for screening mammogram for malignant neoplasm of breast: Secondary | ICD-10-CM | POA: Diagnosis not present

## 2023-10-16 ENCOUNTER — Other Ambulatory Visit: Payer: Self-pay | Admitting: Family Medicine

## 2023-10-16 ENCOUNTER — Other Ambulatory Visit (HOSPITAL_COMMUNITY): Payer: Self-pay

## 2023-10-17 ENCOUNTER — Other Ambulatory Visit: Payer: Self-pay

## 2023-10-17 ENCOUNTER — Other Ambulatory Visit (HOSPITAL_BASED_OUTPATIENT_CLINIC_OR_DEPARTMENT_OTHER): Payer: Self-pay

## 2023-10-17 MED ORDER — AMLODIPINE BESYLATE 10 MG PO TABS
ORAL_TABLET | Freq: Every day | ORAL | 3 refills | Status: DC
  Filled 2023-10-17: qty 90, 90d supply, fill #0
  Filled 2024-01-19: qty 90, 90d supply, fill #1
  Filled 2024-05-25: qty 90, 90d supply, fill #2

## 2023-10-18 ENCOUNTER — Other Ambulatory Visit: Payer: Self-pay

## 2023-10-26 ENCOUNTER — Other Ambulatory Visit (INDEPENDENT_AMBULATORY_CARE_PROVIDER_SITE_OTHER): Payer: Commercial Managed Care - PPO

## 2023-10-26 DIAGNOSIS — E7800 Pure hypercholesterolemia, unspecified: Secondary | ICD-10-CM

## 2023-10-26 LAB — LIPID PANEL
Cholesterol: 214 mg/dL — ABNORMAL HIGH (ref 0–200)
HDL: 98.5 mg/dL (ref >39.00)
LDL Cholesterol: 103 mg/dL — ABNORMAL HIGH (ref 0–99)
NonHDL: 115.32
Total CHOL/HDL Ratio: 2
Triglycerides: 63 mg/dL (ref 0.0–149.0)
VLDL: 12.6 mg/dL (ref 0.0–40.0)

## 2023-12-12 ENCOUNTER — Other Ambulatory Visit: Payer: Self-pay

## 2023-12-26 ENCOUNTER — Other Ambulatory Visit: Payer: Self-pay | Admitting: Family Medicine

## 2023-12-26 ENCOUNTER — Encounter: Payer: Self-pay | Admitting: Family Medicine

## 2023-12-27 ENCOUNTER — Other Ambulatory Visit: Payer: Self-pay | Admitting: Family Medicine

## 2023-12-27 ENCOUNTER — Other Ambulatory Visit: Payer: Self-pay

## 2023-12-27 MED FILL — Ondansetron HCl Tab 4 MG: ORAL | 7 days supply | Qty: 20 | Fill #0 | Status: AC

## 2023-12-27 NOTE — Telephone Encounter (Signed)
 See Refill Request.

## 2023-12-27 NOTE — Telephone Encounter (Signed)
 Last office visit 07/26/2023 for CPE.  Last refilled 12/21/2022 for #20 with no refills.  Next Appt: No future appointments.

## 2023-12-30 ENCOUNTER — Other Ambulatory Visit: Payer: Self-pay

## 2024-01-19 ENCOUNTER — Other Ambulatory Visit: Payer: Self-pay

## 2024-01-19 ENCOUNTER — Other Ambulatory Visit (HOSPITAL_COMMUNITY): Payer: Self-pay

## 2024-03-07 ENCOUNTER — Other Ambulatory Visit: Payer: Self-pay

## 2024-05-16 ENCOUNTER — Other Ambulatory Visit: Payer: Self-pay

## 2024-05-23 ENCOUNTER — Ambulatory Visit: Admitting: Family Medicine

## 2024-05-23 ENCOUNTER — Encounter: Payer: Self-pay | Admitting: Family Medicine

## 2024-05-23 VITALS — BP 128/64 | HR 89 | Temp 98.5°F | Ht 63.5 in | Wt 158.6 lb

## 2024-05-23 DIAGNOSIS — H6122 Impacted cerumen, left ear: Secondary | ICD-10-CM | POA: Diagnosis not present

## 2024-05-23 NOTE — Progress Notes (Signed)
 Patient ID: Jodi Ross, female    DOB: [birth year], 48 y.o.   MRN: [id number]  This visit was conducted in person.  BP 128/64   Pulse 89   Temp 98.5 F (36.9 C) (Oral)   Ht 5' 3.5 (1.613 m)   Wt 158 lb 9.6 oz (71.9 kg)   SpO2 99%   BMI 27.65 kg/m    CC:  Chief Complaint  Patient presents with   Referral    ENT. Feels like her ear is clogged all the time and it affecting her hearing    Subjective:   HPI: Jodi Ross is a 48 y.o. female presenting on 05/23/2024 for Referral (ENT. Feels like her ear is clogged all the time and it affecting her hearing)  New onset clogged feeling in left ear for 1 month.  No pain.  No discharge.  Decreased hearing in left.  No URI or allergy  symptoms.   Has tried home irrigation and peroxide.      Relevant past medical, surgical, family and social history reviewed and updated as indicated. Interim medical history since our last visit reviewed. Allergies and medications reviewed and updated. Outpatient Medications Prior to Visit  Medication Sig Dispense Refill   amLODipine  (NORVASC ) 10 MG tablet TAKE 1 TABLET (10 MG TOTAL) BY MOUTH DAILY. 90 tablet 3   etonogestrel -ethinyl estradiol  (NUVARING) 0.12-0.015 MG/24HR vaginal ring Insert one (1) ring vaginally and leave in place for three (3) weeks, then remove for one (1) week before inserting a new ring. 9 each 3   fluticasone  (FLONASE ) 50 MCG/ACT nasal spray Place 2 sprays into both nostrils daily. 16 g 0   hydrochlorothiazide  (HYDRODIURIL ) 12.5 MG tablet Take 1 tablet (12.5 mg total) by mouth daily. 90 tablet 3   ibuprofen (ADVIL) 200 MG tablet Take 200 mg by mouth every 6 (six) hours as needed.     levocetirizine (XYZAL ) 5 MG tablet Take 1 tablet (5 mg total) by mouth every evening. (Patient taking differently: Take 5 mg by mouth as needed.) 30 tablet 11   losartan  (COZAAR ) 50 MG tablet Take 1 tablet (50 mg total) by mouth daily. 90 tablet 3   ondansetron  (ZOFRAN ) 4 MG tablet Take 1  tablet (4 mg total) by mouth every 8 (eight) hours as needed for nausea or vomiting. 20 tablet 0   pantoprazole  (PROTONIX ) 40 MG tablet Take 1 tablet (40 mg total) by mouth daily. 30 tablet 3   pregabalin  (LYRICA ) 50 MG capsule Take 1 capsule (50 mg total) by mouth 2 (two) times daily. 60 capsule 0   tiZANidine  (ZANAFLEX ) 4 MG tablet Take 1 tablet (4 mg total) by mouth every 6 (six) hours as needed. 120 tablet 0   cyclobenzaprine  (FLEXERIL ) 5 MG tablet Take 1 tablet (5 mg total) by mouth 2 (two) to 3 (three) times daily as needed. 15 tablet 0   No facility-administered medications prior to visit.     Per HPI unless specifically indicated in ROS section below Review of Systems  Constitutional:  Negative for fatigue and fever.  HENT:  Positive for hearing loss. Negative for congestion.   Eyes:  Negative for pain.  Respiratory:  Negative for cough and shortness of breath.   Cardiovascular:  Negative for chest pain, palpitations and leg swelling.  Gastrointestinal:  Negative for abdominal pain.  Genitourinary:  Negative for dysuria and vaginal bleeding.  Musculoskeletal:  Negative for back pain.  Neurological:  Negative for syncope, light-headedness and headaches.  Psychiatric/Behavioral:  Negative  for dysphoric mood.    Objective:  BP 128/64   Pulse 89   Temp 98.5 F (36.9 C) (Oral)   Ht 5' 3.5 (1.613 m)   Wt 158 lb 9.6 oz (71.9 kg)   SpO2 99%   BMI 27.65 kg/m   Wt Readings from Last 3 Encounters:  05/23/24 158 lb 9.6 oz (71.9 kg)  07/26/23 151 lb (68.5 kg)  07/22/22 149 lb (67.6 kg)      Physical Exam Constitutional:      General: She is not in acute distress.    Appearance: Normal appearance. She is well-developed. She is not ill-appearing or toxic-appearing.  HENT:     Head: Normocephalic.     Right Ear: Hearing, tympanic membrane, ear canal and external ear normal. Tympanic membrane is not erythematous, retracted or bulging.     Left Ear: Hearing, tympanic membrane, ear  canal and external ear normal. There is impacted cerumen. Tympanic membrane is not erythematous, retracted or bulging.     Nose: No mucosal edema or rhinorrhea.     Right Sinus: No maxillary sinus tenderness or frontal sinus tenderness.     Left Sinus: No maxillary sinus tenderness or frontal sinus tenderness.     Mouth/Throat:     Pharynx: Uvula midline.  Eyes:     General: Lids are normal. Lids are everted, no foreign bodies appreciated.     Conjunctiva/sclera: Conjunctivae normal.     Pupils: Pupils are equal, round, and reactive to light.  Neck:     Thyroid : No thyroid  mass or thyromegaly.     Vascular: No carotid bruit.     Trachea: Trachea normal.  Cardiovascular:     Rate and Rhythm: Normal rate and regular rhythm.     Pulses: Normal pulses.     Heart sounds: Normal heart sounds, S1 normal and S2 normal. No murmur heard.    No friction rub. No gallop.  Pulmonary:     Effort: Pulmonary effort is normal. No tachypnea or respiratory distress.     Breath sounds: Normal breath sounds. No decreased breath sounds, wheezing, rhonchi or rales.  Abdominal:     General: Bowel sounds are normal.     Palpations: Abdomen is soft.     Tenderness: There is no abdominal tenderness.  Musculoskeletal:     Cervical back: Normal range of motion and neck supple.  Skin:    General: Skin is warm and dry.     Findings: No rash.  Neurological:     Mental Status: She is alert.  Psychiatric:        Mood and Affect: Mood is not anxious or depressed.        Speech: Speech normal.        Behavior: Behavior normal. Behavior is cooperative.        Thought Content: Thought content normal.        Judgment: Judgment normal.       Results for orders placed or performed in visit on 10/26/23  Lipid panel   Collection Time: 10/26/23  7:58 AM  Result Value Ref Range   Cholesterol 214 (H) 0 - 200 mg/dL   Triglycerides 36.9 0.0 - 149.0 mg/dL   HDL 01.49 >60.99 mg/dL   VLDL 87.3 0.0 - 59.9 mg/dL   LDL  Cholesterol 896 (H) 0 - 99 mg/dL   Total CHOL/HDL Ratio 2    NonHDL 115.32     Assessment and Plan  Impacted cerumen of left ear Assessment & Plan: Cerumen  impaction removal via irrigation Performed by : : Trudy Davene CROME, CMA  Consent for procedure obtained verbally. Left ear lavaged/ irrigated with warm water gently. Pt tolerated procedure well, with no complications. After procedure  L ear clear of cerumen impaction, with minimal ear canal irritation and redness, no bleeding. TM intact and symptoms improved.       No follow-ups on file.   Greig Ring, MD

## 2024-05-23 NOTE — Assessment & Plan Note (Addendum)
 Cerumen impaction removal via irrigation Performed by : : Trudy Davene CROME, CMA  Consent for procedure obtained verbally. Left ear lavaged/ irrigated with warm water gently. Pt tolerated procedure well, with no complications. After procedure  L ear clear of cerumen impaction, with minimal ear canal irritation and redness, no bleeding. TM intact and symptoms improved.

## 2024-05-25 ENCOUNTER — Other Ambulatory Visit: Payer: Self-pay

## 2024-07-08 ENCOUNTER — Other Ambulatory Visit: Payer: Self-pay

## 2024-07-26 ENCOUNTER — Other Ambulatory Visit: Payer: Self-pay

## 2024-07-26 ENCOUNTER — Ambulatory Visit (INDEPENDENT_AMBULATORY_CARE_PROVIDER_SITE_OTHER): Admitting: Family Medicine

## 2024-07-26 ENCOUNTER — Ambulatory Visit: Payer: Self-pay | Admitting: Family Medicine

## 2024-07-26 ENCOUNTER — Encounter: Payer: Self-pay | Admitting: Family Medicine

## 2024-07-26 VITALS — BP 112/70 | HR 102 | Temp 98.6°F | Ht 64.0 in | Wt 152.5 lb

## 2024-07-26 DIAGNOSIS — Z0000 Encounter for general adult medical examination without abnormal findings: Secondary | ICD-10-CM

## 2024-07-26 DIAGNOSIS — Z23 Encounter for immunization: Secondary | ICD-10-CM

## 2024-07-26 DIAGNOSIS — R0982 Postnasal drip: Secondary | ICD-10-CM | POA: Diagnosis not present

## 2024-07-26 DIAGNOSIS — E785 Hyperlipidemia, unspecified: Secondary | ICD-10-CM

## 2024-07-26 DIAGNOSIS — R5383 Other fatigue: Secondary | ICD-10-CM

## 2024-07-26 DIAGNOSIS — I10 Essential (primary) hypertension: Secondary | ICD-10-CM | POA: Diagnosis not present

## 2024-07-26 DIAGNOSIS — Z1231 Encounter for screening mammogram for malignant neoplasm of breast: Secondary | ICD-10-CM

## 2024-07-26 LAB — CBC WITH DIFFERENTIAL/PLATELET
Basophils Absolute: 0 K/uL (ref 0.0–0.1)
Basophils Relative: 0.7 % (ref 0.0–3.0)
Eosinophils Absolute: 0.2 K/uL (ref 0.0–0.7)
Eosinophils Relative: 2.7 % (ref 0.0–5.0)
HCT: 36.8 % (ref 36.0–46.0)
Hemoglobin: 11.8 g/dL — ABNORMAL LOW (ref 12.0–15.0)
Lymphocytes Relative: 42.2 % (ref 12.0–46.0)
Lymphs Abs: 2.5 K/uL (ref 0.7–4.0)
MCHC: 32 g/dL (ref 30.0–36.0)
MCV: 93.2 fl (ref 78.0–100.0)
Monocytes Absolute: 0.4 K/uL (ref 0.1–1.0)
Monocytes Relative: 6.6 % (ref 3.0–12.0)
Neutro Abs: 2.9 K/uL (ref 1.4–7.7)
Neutrophils Relative %: 47.8 % (ref 43.0–77.0)
Platelets: 294 K/uL (ref 150.0–400.0)
RBC: 3.95 Mil/uL (ref 3.87–5.11)
RDW: 12.7 % (ref 11.5–15.5)
WBC: 6 K/uL (ref 4.0–10.5)

## 2024-07-26 LAB — COMPREHENSIVE METABOLIC PANEL WITH GFR
ALT: 15 U/L (ref 0–35)
AST: 15 U/L (ref 0–37)
Albumin: 4.1 g/dL (ref 3.5–5.2)
Alkaline Phosphatase: 30 U/L — ABNORMAL LOW (ref 39–117)
BUN: 10 mg/dL (ref 6–23)
CO2: 27 meq/L (ref 19–32)
Calcium: 10.4 mg/dL (ref 8.4–10.5)
Chloride: 104 meq/L (ref 96–112)
Creatinine, Ser: 0.97 mg/dL (ref 0.40–1.20)
GFR: 69.38 mL/min (ref >60.00)
Glucose, Bld: 93 mg/dL (ref 70–99)
Potassium: 4.1 meq/L (ref 3.5–5.1)
Sodium: 137 meq/L (ref 135–145)
Total Bilirubin: 0.7 mg/dL (ref 0.2–1.2)
Total Protein: 7.4 g/dL (ref 6.0–8.3)

## 2024-07-26 LAB — VITAMIN D 25 HYDROXY (VIT D DEFICIENCY, FRACTURES): VITD: 22.52 ng/mL — ABNORMAL LOW (ref 30.00–100.00)

## 2024-07-26 LAB — LIPID PANEL
Cholesterol: 224 mg/dL — ABNORMAL HIGH (ref 0–200)
HDL: 96.1 mg/dL (ref >39.00)
LDL Cholesterol: 115 mg/dL — ABNORMAL HIGH (ref 0–99)
NonHDL: 128.27
Total CHOL/HDL Ratio: 2
Triglycerides: 66 mg/dL (ref 0.0–149.0)
VLDL: 13.2 mg/dL (ref 0.0–40.0)

## 2024-07-26 LAB — TSH: TSH: 0.98 u[IU]/mL (ref 0.35–5.50)

## 2024-07-26 LAB — VITAMIN B12: Vitamin B-12: 846 pg/mL (ref 211–911)

## 2024-07-26 MED ORDER — ONDANSETRON HCL 4 MG PO TABS
4.0000 mg | ORAL_TABLET | Freq: Three times a day (TID) | ORAL | 0 refills | Status: AC | PRN
  Filled 2024-07-26: qty 20, 7d supply, fill #0

## 2024-07-26 MED ORDER — AMLODIPINE BESYLATE 10 MG PO TABS
ORAL_TABLET | Freq: Every day | ORAL | 3 refills | Status: AC
  Filled 2024-07-26: qty 90, fill #0
  Filled 2024-09-03: qty 90, 90d supply, fill #0

## 2024-07-26 MED ORDER — LOSARTAN POTASSIUM 50 MG PO TABS
50.0000 mg | ORAL_TABLET | Freq: Every day | ORAL | 3 refills | Status: AC
  Filled 2024-07-26 – 2024-10-14 (×2): qty 90, 90d supply, fill #0

## 2024-07-26 MED ORDER — ETONOGESTREL-ETHINYL ESTRADIOL 0.12-0.015 MG/24HR VA RING
VAGINAL_RING | VAGINAL | 3 refills | Status: AC
Start: 2024-07-26 — End: ?
  Filled 2024-07-26: qty 9, fill #0
  Filled 2024-08-13: qty 3, 84d supply, fill #0
  Filled 2024-11-03: qty 3, 84d supply, fill #1

## 2024-07-26 NOTE — Assessment & Plan Note (Addendum)
 Stable, chronic.    Losartan   50mg  daily Amlodipine  10 mg p.o. daily

## 2024-07-26 NOTE — Progress Notes (Signed)
 Patient ID: Jodi Ross, female    DOB: 22-Jun-1976, 48 y.o.   MRN: [id number]  This visit was conducted in person.  BP 112/70   Pulse (!) 102   Temp 98.6 F (37 C) (Temporal)   Ht 5' 4 (1.626 m)   Wt 152 lb 8 oz (69.2 kg)   LMP 07/11/2024   SpO2 96%   BMI 26.18 kg/m    CC:  Chief Complaint  Patient presents with   Annual Exam    Subjective:   HPI: Jodi Ross is a 48 y.o. female presenting on 07/26/2024 for Annual Exam The patient presents for  complete physical and review of chronic health problems. He/She also has the following acute concerns today: none  Reviewed labs in detail with patient.   She is walking daily at work 10, 00 steps eachday... 20-30 min several times a week.  At last lab.SABRA LDL improved with lifestyle changes Lab Results  Component Value Date   CHOL 214 (H) 10/26/2023   HDL 98.50 10/26/2023   LDLCALC 103 (H) 10/26/2023   TRIG 63.0 10/26/2023   CHOLHDL 2 10/26/2023  The 10-year ASCVD risk score (Arnett DK, et al., 2019) is: 0.6%   Values used to calculate the score:     Age: 57 years     Clincally relevant sex: Female     Is Non-Hispanic African American: Yes     Diabetic: No     Tobacco smoker: No     Systolic Blood Pressure: 112 mmHg     Is BP treated: Yes     HDL Cholesterol: 98.5 mg/dL     Total Cholesterol: 214 mg/dL   Hypertension:  Well-controlled on losartan    50 mg daily and amlodipine  10 mg p.o. daily.  Using medication without problems or lightheadedness:  occ.. improved with taking at different times. Chest pain with exertion: none Edema:none Short of breath:none Average home BPs: 106/70 Other issues: BP Readings from Last 3 Encounters:  07/26/24 112/70  05/23/24 128/64  07/26/23 132/86    Allergies rhinitis, seasonal: Using Xyzal  , flonase  2 spray per nostril daily and benadryl Prn.  She has nausea when having post nasal drip.  Hx of recurrent  boil but none since 2022. No wound culture done in past.      GERD, triggered by tomato and coffee.SABRA avoids these. Using protonix  as needed.  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 Interim medical history since our last visit reviewed. Allergies and medications reviewed and updated. Outpatient Medications Prior to Visit  Medication Sig Dispense Refill   fluticasone  (FLONASE ) 50 MCG/ACT nasal spray Place 2 sprays into both nostrils daily. 16 g 0   hydrochlorothiazide  (MICROZIDE ) 12.5 MG capsule Take 12.5 mg by mouth daily as needed.     ibuprofen (ADVIL) 200 MG tablet Take 200 mg by mouth every 6 (six) hours as needed.     levocetirizine (XYZAL ) 5 MG tablet Take 5 mg by mouth daily as needed for allergies.     pantoprazole  (PROTONIX ) 40 MG tablet Take 40 mg by mouth daily as needed.     amLODipine  (NORVASC ) 10 MG tablet TAKE 1 TABLET (10 MG TOTAL) BY MOUTH DAILY. 90 tablet 3   etonogestrel -ethinyl estradiol  (NUVARING) 0.12-0.015 MG/24HR vaginal ring Insert one (1) ring vaginally and leave in place for three (3) weeks, then remove for one (1) week  before inserting a new ring. 9 each 3   hydrochlorothiazide  (HYDRODIURIL ) 12.5 MG tablet Take 1 tablet (12.5 mg total) by mouth daily. 90 tablet 3   losartan  (COZAAR ) 50 MG tablet Take 1 tablet (50 mg total) by mouth daily. 90 tablet 3   ondansetron  (ZOFRAN ) 4 MG tablet Take 1 tablet (4 mg total) by mouth every 8 (eight) hours as needed for nausea or vomiting. 20 tablet 0   pantoprazole  (PROTONIX ) 40 MG tablet Take 1 tablet (40 mg total) by mouth daily. 30 tablet 3   levocetirizine (XYZAL ) 5 MG tablet Take 1 tablet (5 mg total) by mouth every evening. (Patient taking differently: Take 5 mg by mouth as needed.) 30 tablet 11   pregabalin  (LYRICA ) 50 MG capsule Take 1 capsule (50 mg total) by mouth 2 (two) times daily. 60 capsule 0   tiZANidine  (ZANAFLEX ) 4 MG tablet Take 1 tablet (4 mg total) by  mouth every 6 (six) hours as needed. 120 tablet 0   No facility-administered medications prior to visit.     Per HPI unless specifically indicated in ROS section below Review of Systems  Constitutional:  Negative for fatigue and fever.  HENT:  Negative for congestion.   Eyes:  Negative for pain.  Respiratory:  Negative for cough and shortness of breath.   Cardiovascular:  Negative for chest pain, palpitations and leg swelling.  Gastrointestinal:  Negative for abdominal pain.  Genitourinary:  Negative for dysuria and vaginal bleeding.  Musculoskeletal:  Negative for back pain.  Neurological:  Negative for syncope, light-headedness and headaches.  Psychiatric/Behavioral:  Negative for dysphoric mood.    Objective:  BP 112/70   Pulse (!) 102   Temp 98.6 F (37 C) (Temporal)   Ht 5' 4 (1.626 m)   Wt 152 lb 8 oz (69.2 kg)   LMP 07/11/2024   SpO2 96%   BMI 26.18 kg/m   Wt Readings from Last 3 Encounters:  07/26/24 152 lb 8 oz (69.2 kg)  05/23/24 158 lb 9.6 oz (71.9 kg)  07/26/23 151 lb (68.5 kg)      Physical Exam Vitals and nursing note reviewed.  Constitutional:      General: She is not in acute distress.    Appearance: Normal appearance. She is well-developed. She is not ill-appearing or toxic-appearing.  HENT:     Head: Normocephalic.     Right Ear: Hearing, tympanic membrane, ear canal and external ear normal.     Left Ear: Hearing, tympanic membrane, ear canal and external ear normal.     Nose: Nose normal.  Eyes:     General: Lids are normal. Lids are everted, no foreign bodies appreciated.     Conjunctiva/sclera: Conjunctivae normal.     Pupils: Pupils are equal, round, and reactive to light.  Neck:     Thyroid : No thyroid  mass or thyromegaly.     Vascular: No carotid bruit.     Trachea: Trachea normal.  Cardiovascular:     Rate and Rhythm: Normal rate and regular rhythm.     Heart sounds: Normal heart sounds, S1 normal and S2 normal. No murmur heard.    No  gallop.  Pulmonary:     Effort: Pulmonary effort is normal. No respiratory distress.     Breath sounds: Normal breath sounds. No wheezing, rhonchi or rales.  Abdominal:     General: Bowel sounds are normal. There is no distension or abdominal bruit.     Palpations: Abdomen is soft. There is no fluid  wave or mass.     Tenderness: There is no abdominal tenderness. There is no guarding or rebound.     Hernia: No hernia is present.  Musculoskeletal:     Cervical back: Normal range of motion and neck supple.  Lymphadenopathy:     Cervical: No cervical adenopathy.  Skin:    General: Skin is warm and dry.     Findings: No rash.  Neurological:     Mental Status: She is alert.     Cranial Nerves: No cranial nerve deficit.     Sensory: No sensory deficit.  Psychiatric:        Mood and Affect: Mood is not anxious or depressed.        Speech: Speech normal.        Behavior: Behavior normal. Behavior is cooperative.        Judgment: Judgment normal.       Results for orders placed or performed in visit on 10/26/23  Lipid panel   Collection Time: 10/26/23  7:58 AM  Result Value Ref Range   Cholesterol 214 (H) 0 - 200 mg/dL   Triglycerides 36.9 0.0 - 149.0 mg/dL   HDL 01.49 >60.99 mg/dL   VLDL 87.3 0.0 - 59.9 mg/dL   LDL Cholesterol 896 (H) 0 - 99 mg/dL   Total CHOL/HDL Ratio 2    NonHDL 115.32      COVID 19 screen:  No recent travel or known exposure to COVID19 The patient denies respiratory symptoms of COVID 19 at this time. The importance of social distancing was discussed today.   Assessment and Plan The patient's preventative maintenance and recommended screening tests for an annual wellness exam were reviewed in full today. Brought up to date unless services declined.  Counselled on the importance of diet, exercise, and its role in overall health and mortality. The patient's FH and SH was reviewed, including their home life, tobacco status, and drug and alcohol status.    Vaccines: Flu vaccine given today, uptodate with tetanus Pap/DVE:   07/2023,  doing well on Nuvaring Mammo:  09/2023 Colon: iFOB  neg in 2022, Cologuard October 2023 negative, repeat in 2026  Smoking Status:none ETOH/ drug use:  once a week/none  Hep C:  done  HIV screen:  none     Problem List Items Addressed This Visit     Essential hypertension   Stable, chronic.    Losartan   50mg  daily Amlodipine  10 mg p.o. daily      Relevant Medications   amLODipine  (NORVASC ) 10 MG tablet   losartan  (COZAAR ) 50 MG tablet   hydrochlorothiazide  (MICROZIDE ) 12.5 MG capsule   Hyperlipidemia   Chronic, improved control      Relevant Medications   amLODipine  (NORVASC ) 10 MG tablet   losartan  (COZAAR ) 50 MG tablet   hydrochlorothiazide  (MICROZIDE ) 12.5 MG capsule   Other Relevant Orders   Lipid panel   Comprehensive metabolic panel with GFR   Other Visit Diagnoses       Routine general medical examination at a health care facility    -  Primary     PND (post-nasal drip)         Screening mammogram for breast cancer       Relevant Orders   MM DIGITAL SCREENING BILATERAL     Other fatigue       Relevant Orders   CBC with Differential/Platelet   Vitamin B12   TSH   VITAMIN D  25 Hydroxy (Vit-D Deficiency, Fractures)  Greig Ring, MD

## 2024-07-26 NOTE — Assessment & Plan Note (Signed)
Chronic, improved control

## 2024-07-26 NOTE — Addendum Note (Signed)
 Addended by: WENDELL ARLAND RAMAN on: 07/26/2024 10:48 AM   Modules accepted: Orders

## 2024-08-01 ENCOUNTER — Other Ambulatory Visit: Payer: Self-pay

## 2024-08-13 ENCOUNTER — Other Ambulatory Visit: Payer: Self-pay

## 2024-08-20 ENCOUNTER — Other Ambulatory Visit: Payer: Self-pay | Admitting: Family Medicine

## 2024-08-20 DIAGNOSIS — Z1231 Encounter for screening mammogram for malignant neoplasm of breast: Secondary | ICD-10-CM

## 2024-09-03 ENCOUNTER — Other Ambulatory Visit (HOSPITAL_BASED_OUTPATIENT_CLINIC_OR_DEPARTMENT_OTHER): Payer: Self-pay

## 2024-09-03 ENCOUNTER — Other Ambulatory Visit: Payer: Self-pay

## 2024-10-08 ENCOUNTER — Ambulatory Visit
Admission: RE | Admit: 2024-10-08 | Discharge: 2024-10-08 | Disposition: A | Discharge status: Home / Self Care | Source: Ambulatory Visit | Attending: Family Medicine | Admitting: Family Medicine

## 2024-10-08 DIAGNOSIS — Z1231 Encounter for screening mammogram for malignant neoplasm of breast: Secondary | ICD-10-CM | POA: Diagnosis not present

## 2024-10-14 ENCOUNTER — Other Ambulatory Visit: Payer: Self-pay

## 2024-10-16 ENCOUNTER — Ambulatory Visit: Payer: Self-pay | Admitting: Family Medicine

## 2024-10-19 ENCOUNTER — Other Ambulatory Visit: Payer: Self-pay

## 2024-10-23 ENCOUNTER — Other Ambulatory Visit: Payer: Self-pay

## 2024-10-23 ENCOUNTER — Other Ambulatory Visit: Payer: Self-pay | Admitting: Family Medicine

## 2024-10-29 ENCOUNTER — Other Ambulatory Visit: Payer: Self-pay | Admitting: Family Medicine

## 2024-10-29 ENCOUNTER — Other Ambulatory Visit: Payer: Self-pay

## 2024-10-29 MED ORDER — PANTOPRAZOLE SODIUM 40 MG PO TBEC
40.0000 mg | DELAYED_RELEASE_TABLET | Freq: Every day | ORAL | 3 refills | Status: AC | PRN
  Filled 2024-10-29: qty 90, 90d supply, fill #0

## 2024-11-04 ENCOUNTER — Other Ambulatory Visit (HOSPITAL_COMMUNITY): Payer: Self-pay
